# Patient Record
Sex: Female | Born: 1970 | Race: White | Hispanic: No | Marital: Single | Smoking: Current every day smoker
Health system: Southern US, Community
[De-identification: ages and names within clinical notes are randomized; demographics above are authoritative.]

## PROBLEM LIST (undated history)

## (undated) DIAGNOSIS — F419 Anxiety disorder, unspecified: Secondary | ICD-10-CM

## (undated) DIAGNOSIS — I10 Essential (primary) hypertension: Secondary | ICD-10-CM

## (undated) DIAGNOSIS — G43909 Migraine, unspecified, not intractable, without status migrainosus: Secondary | ICD-10-CM

## (undated) DIAGNOSIS — F32A Depression, unspecified: Secondary | ICD-10-CM

## (undated) DIAGNOSIS — Z22322 Carrier or suspected carrier of Methicillin resistant Staphylococcus aureus: Secondary | ICD-10-CM

## (undated) DIAGNOSIS — K219 Gastro-esophageal reflux disease without esophagitis: Secondary | ICD-10-CM

## (undated) DIAGNOSIS — T8859XA Other complications of anesthesia, initial encounter: Secondary | ICD-10-CM

## (undated) DIAGNOSIS — M1990 Unspecified osteoarthritis, unspecified site: Secondary | ICD-10-CM

## (undated) HISTORY — PX: TONSILLECTOMY: SUR1361

## (undated) HISTORY — PX: CHOLECYSTECTOMY: SHX55

## (undated) HISTORY — DX: Gastro-esophageal reflux disease without esophagitis: K21.9

## (undated) HISTORY — DX: Essential (primary) hypertension: I10

## (undated) HISTORY — DX: Migraine, unspecified, not intractable, without status migrainosus: G43.909

## (undated) HISTORY — PX: TUBAL LIGATION: SHX77

## (undated) HISTORY — DX: Unspecified osteoarthritis, unspecified site: M19.90

---

## 2004-04-21 ENCOUNTER — Ambulatory Visit: Payer: Self-pay | Admitting: Unknown Physician Specialty

## 2005-04-05 ENCOUNTER — Emergency Department (HOSPITAL_COMMUNITY): Admission: EM | Admit: 2005-04-05 | Discharge: 2005-04-05 | Payer: Self-pay | Admitting: Emergency Medicine

## 2005-04-05 IMAGING — CR DG HIP (WITH OR WITHOUT PELVIS) 2-3V*L*
3 series · 3 of 3 positions shown · non-contrast
Comparison: none

CLINICAL DATA: Fall with left arm, low back, left hip, and left ankle pain.
 LEFT HUMERUS - 2 VIEW:

[t pelvis a.p.]
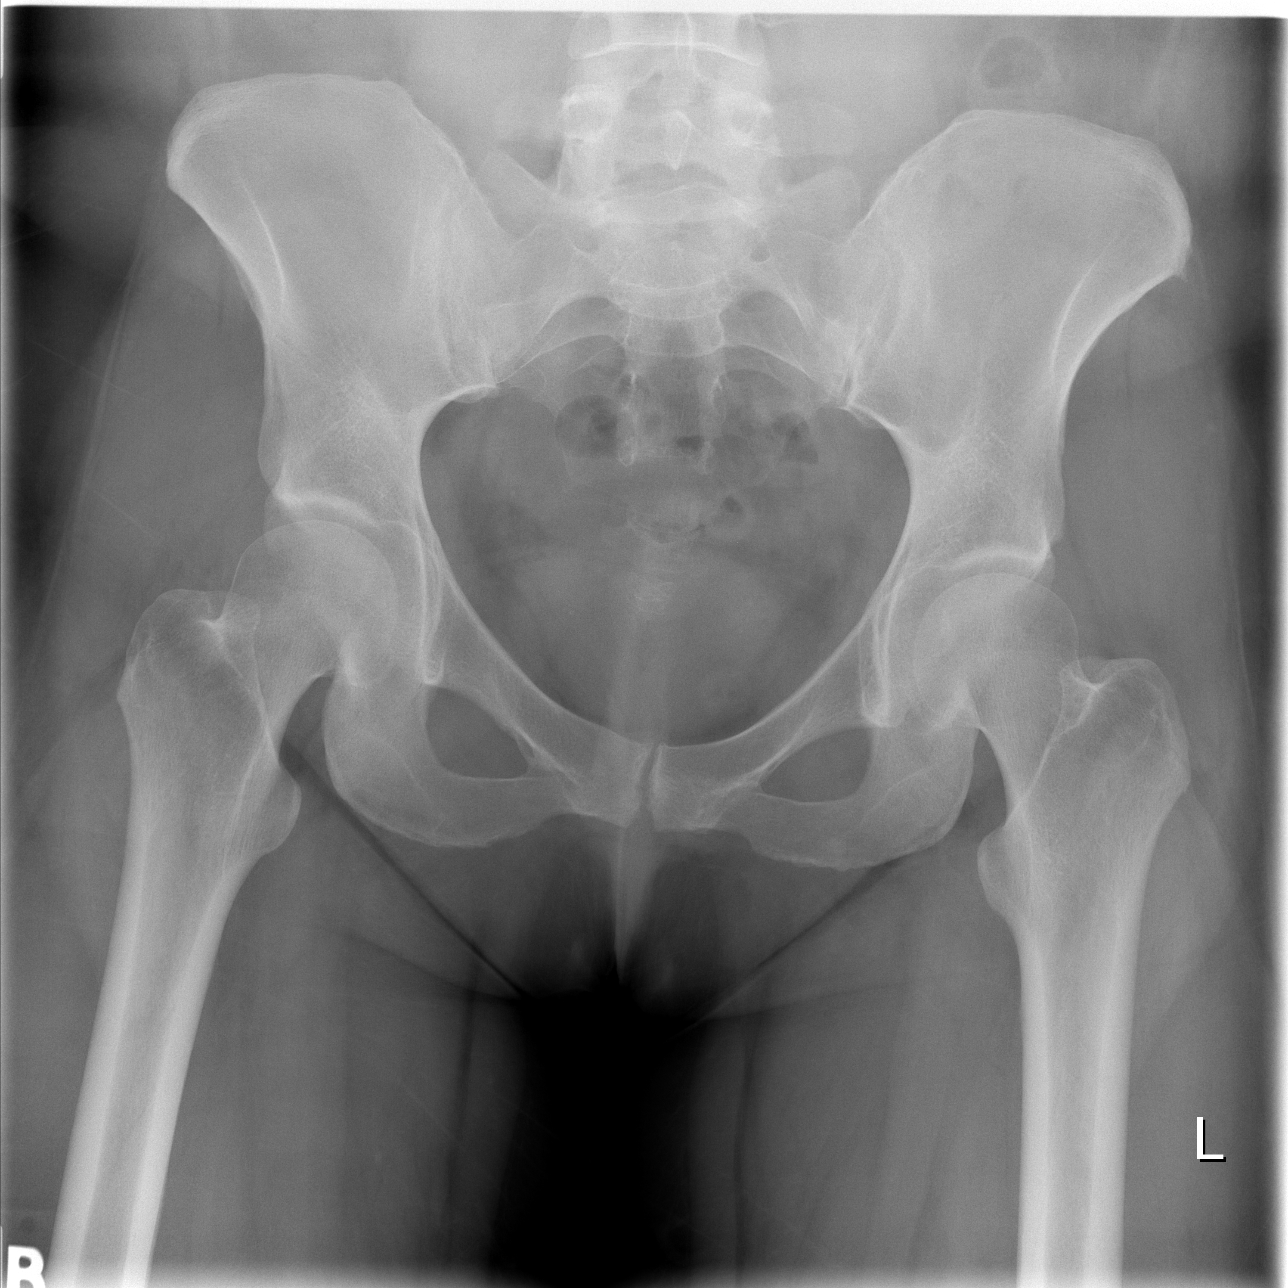

[t hip ap left]
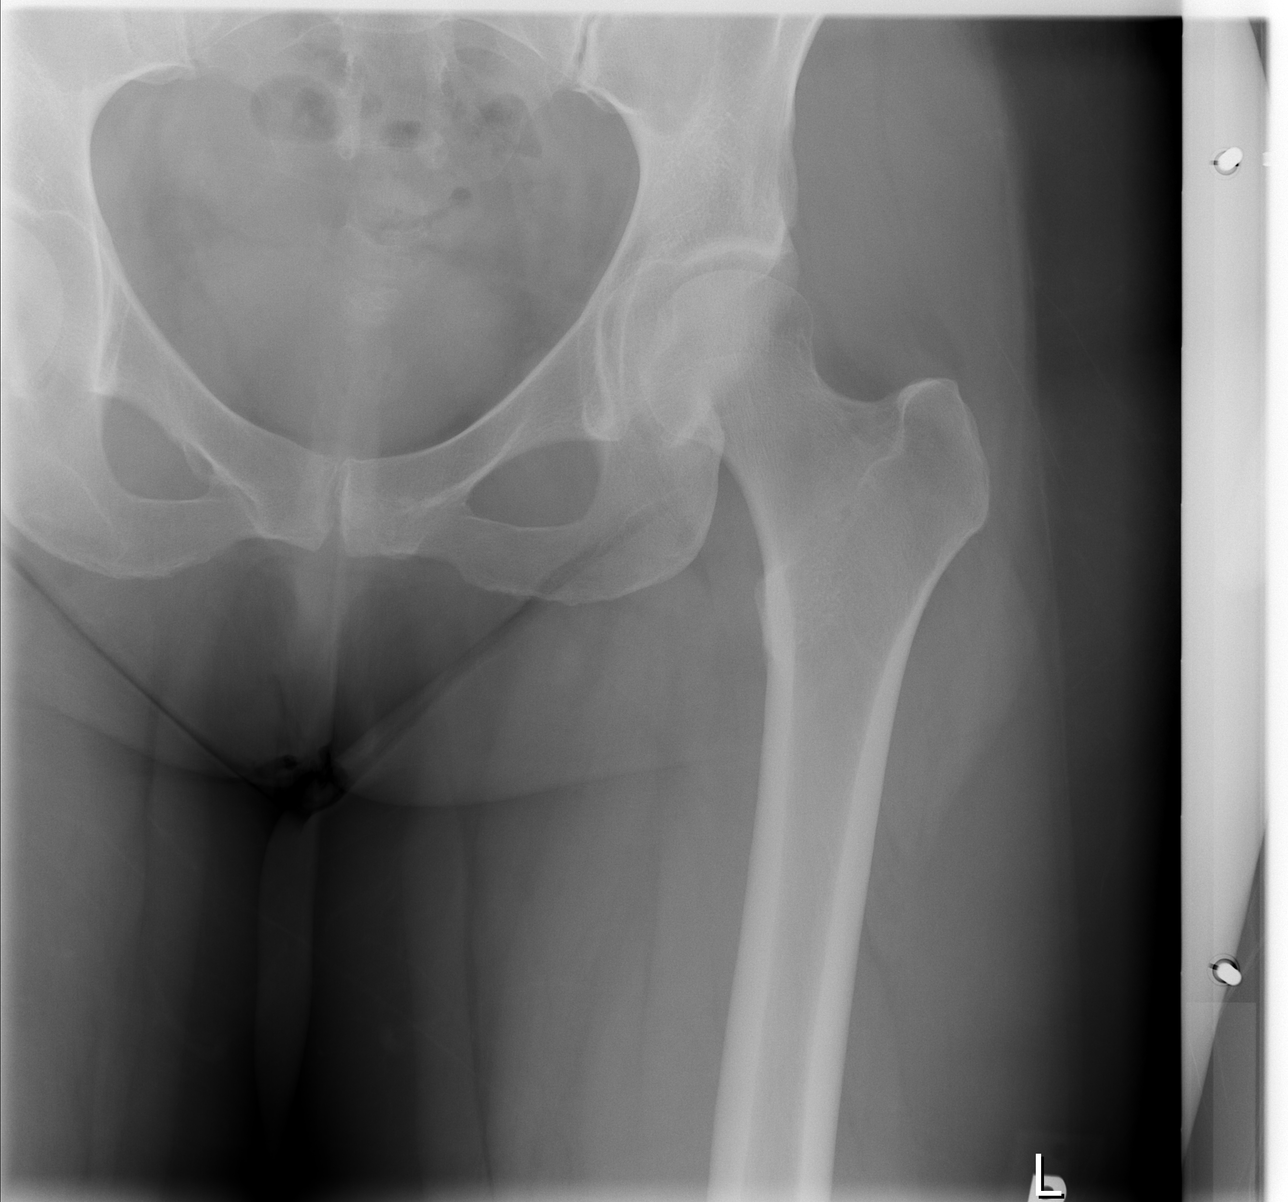

[t hip frog leg left]
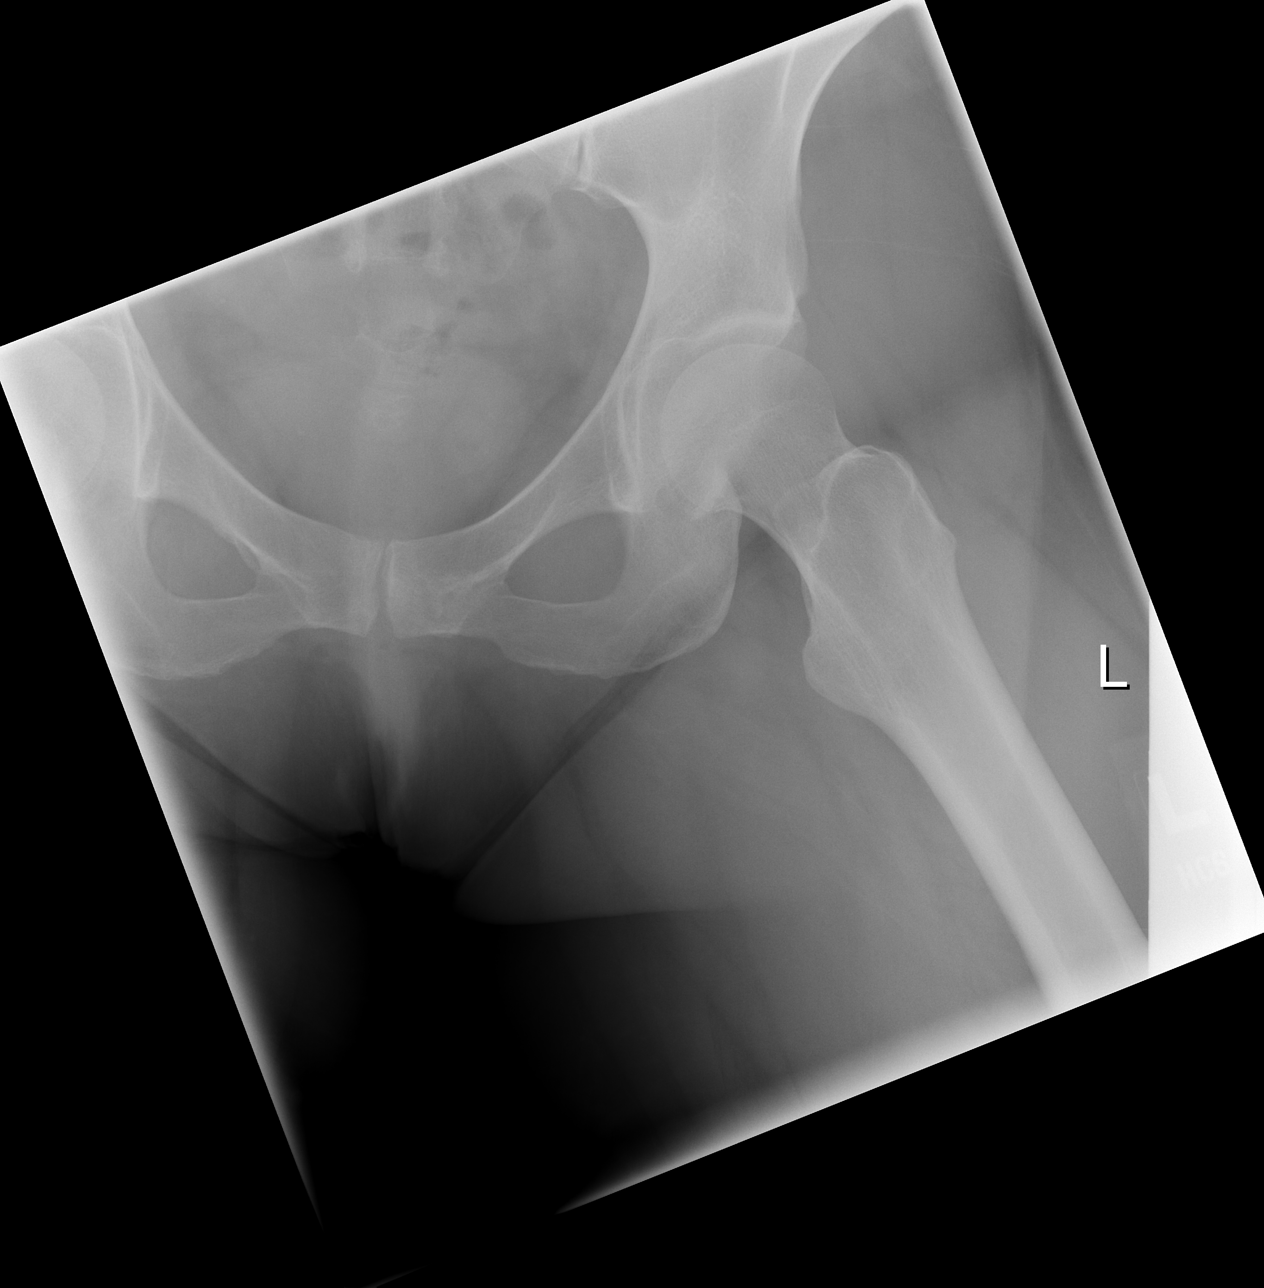

[3 of 3 positions shown; findings below may reference images not displayed]

FINDINGS: There is no evidence of fracture or other focal bone lesions.  Soft tissues are unremarkable.
IMPRESSION: Negative.
 LEFT ANKLE - 3 VIEW:
FINDINGS: There is no evidence of fracture, dislocation, or joint effusion.  There is no evidence of arthropathy or other focal bone abnormality.  Soft tissues are unremarkable.
IMPRESSION: Negative.
 LEFT HIP - 3 VIEW:
FINDINGS: There is no evidence of hip fracture or dislocation.  There is no evidence of arthropathy or other focal bone abnormality.
IMPRESSION: Negative.
 LUMBAR SPINE - 5 VIEW:
FINDINGS: There is no evidence of lumbar spine fracture.  Alignment is normal.  Intervertebral disc spaces are maintained, and no other significant bone abnormalities are identified.
IMPRESSION: Negative lumbar spine radiographs.

## 2005-04-05 IMAGING — CR DG ANKLE COMPLETE 3+V*L*
3 series · 3 of 3 positions shown · non-contrast
Comparison: none

CLINICAL DATA: Fall with left arm, low back, left hip, and left ankle pain.
 LEFT HUMERUS - 2 VIEW:

[t ankle joint ap left]
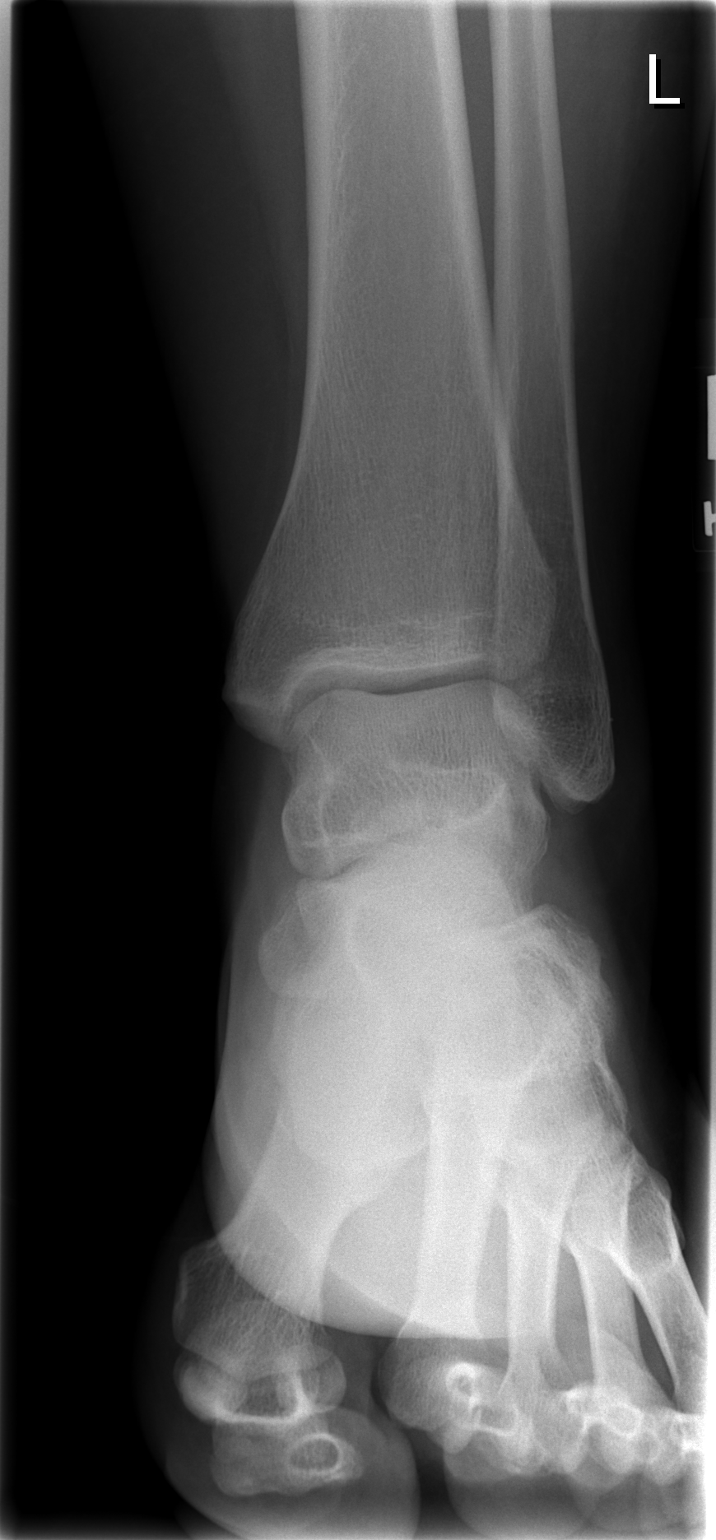

[t ankle joint oblique left]
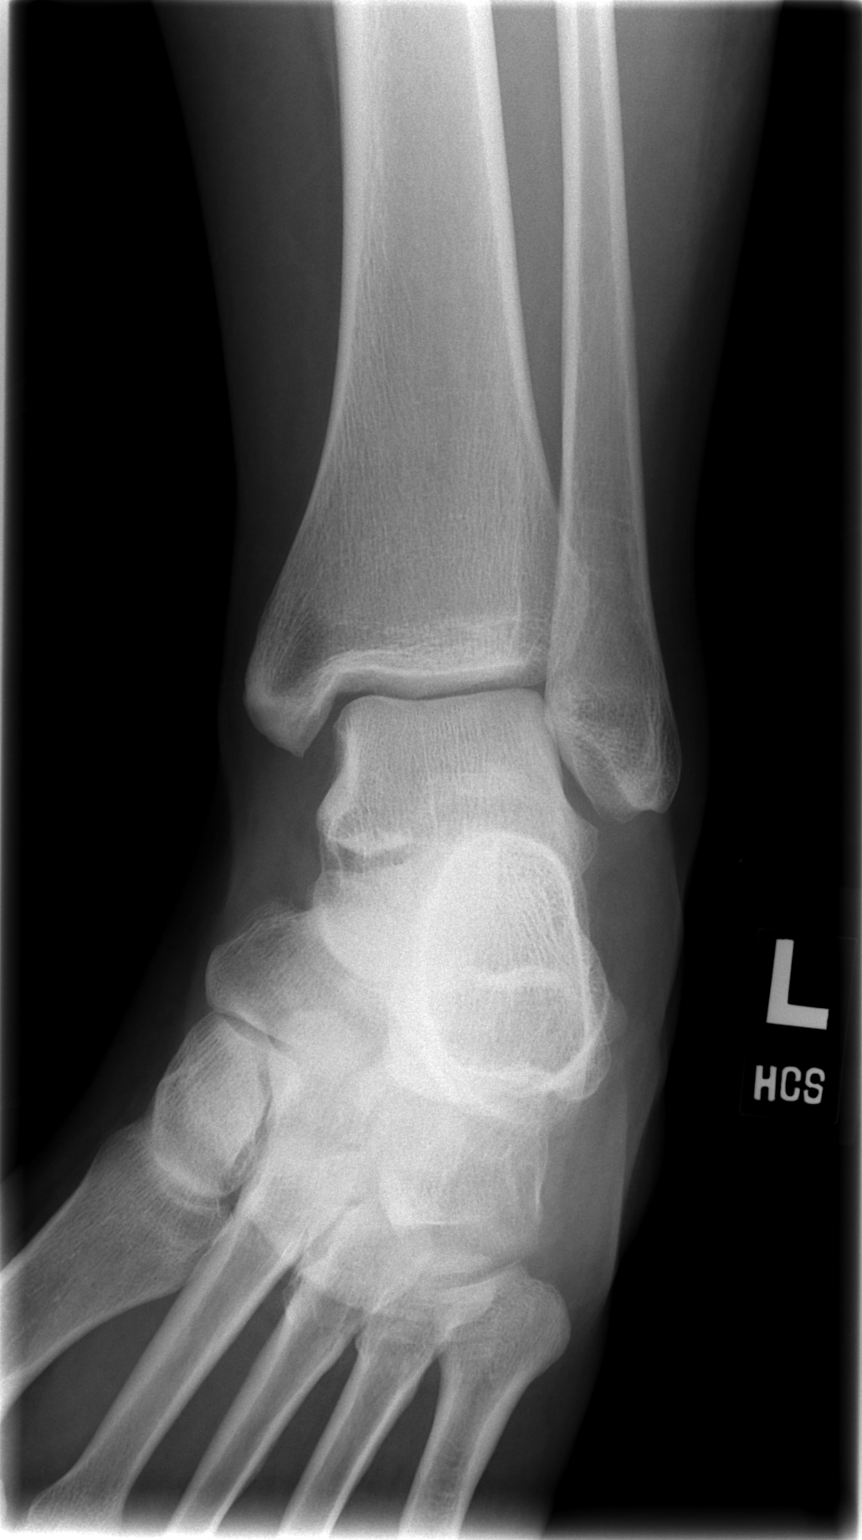

[t ankle joint lat left]
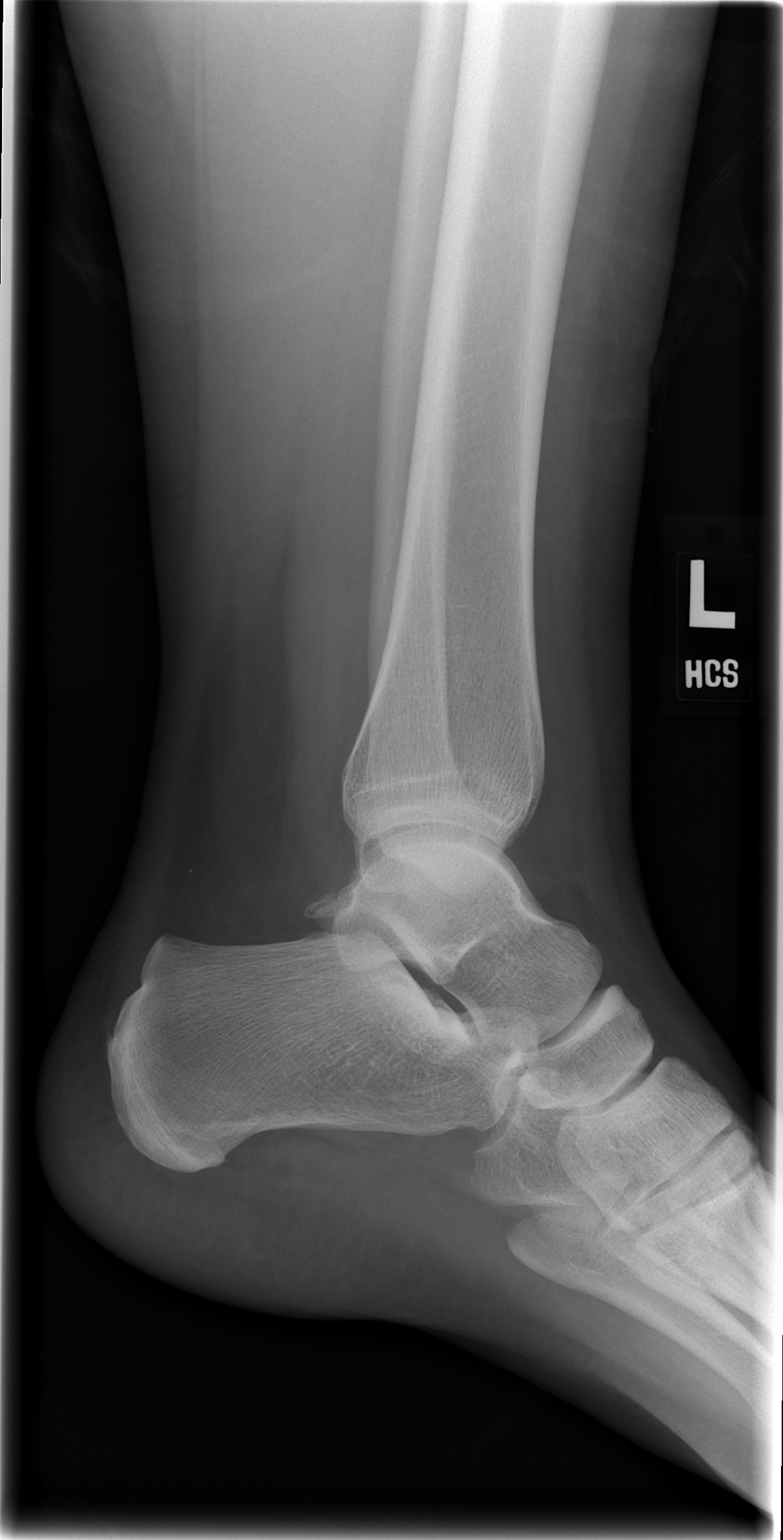

[3 of 3 positions shown; findings below may reference images not displayed]

FINDINGS: There is no evidence of fracture or other focal bone lesions.  Soft tissues are unremarkable.
IMPRESSION: Negative.
 LEFT ANKLE - 3 VIEW:
FINDINGS: There is no evidence of fracture, dislocation, or joint effusion.  There is no evidence of arthropathy or other focal bone abnormality.  Soft tissues are unremarkable.
IMPRESSION: Negative.
 LEFT HIP - 3 VIEW:
FINDINGS: There is no evidence of hip fracture or dislocation.  There is no evidence of arthropathy or other focal bone abnormality.
IMPRESSION: Negative.
 LUMBAR SPINE - 5 VIEW:
FINDINGS: There is no evidence of lumbar spine fracture.  Alignment is normal.  Intervertebral disc spaces are maintained, and no other significant bone abnormalities are identified.
IMPRESSION: Negative lumbar spine radiographs.

## 2005-04-05 IMAGING — CR DG HUMERUS 2V *L*
2 series · 2 of 2 positions shown · non-contrast
Comparison: none

CLINICAL DATA: Fall with left arm, low back, left hip, and left ankle pain.
 LEFT HUMERUS - 2 VIEW:

[view not recorded (1 of 2)]
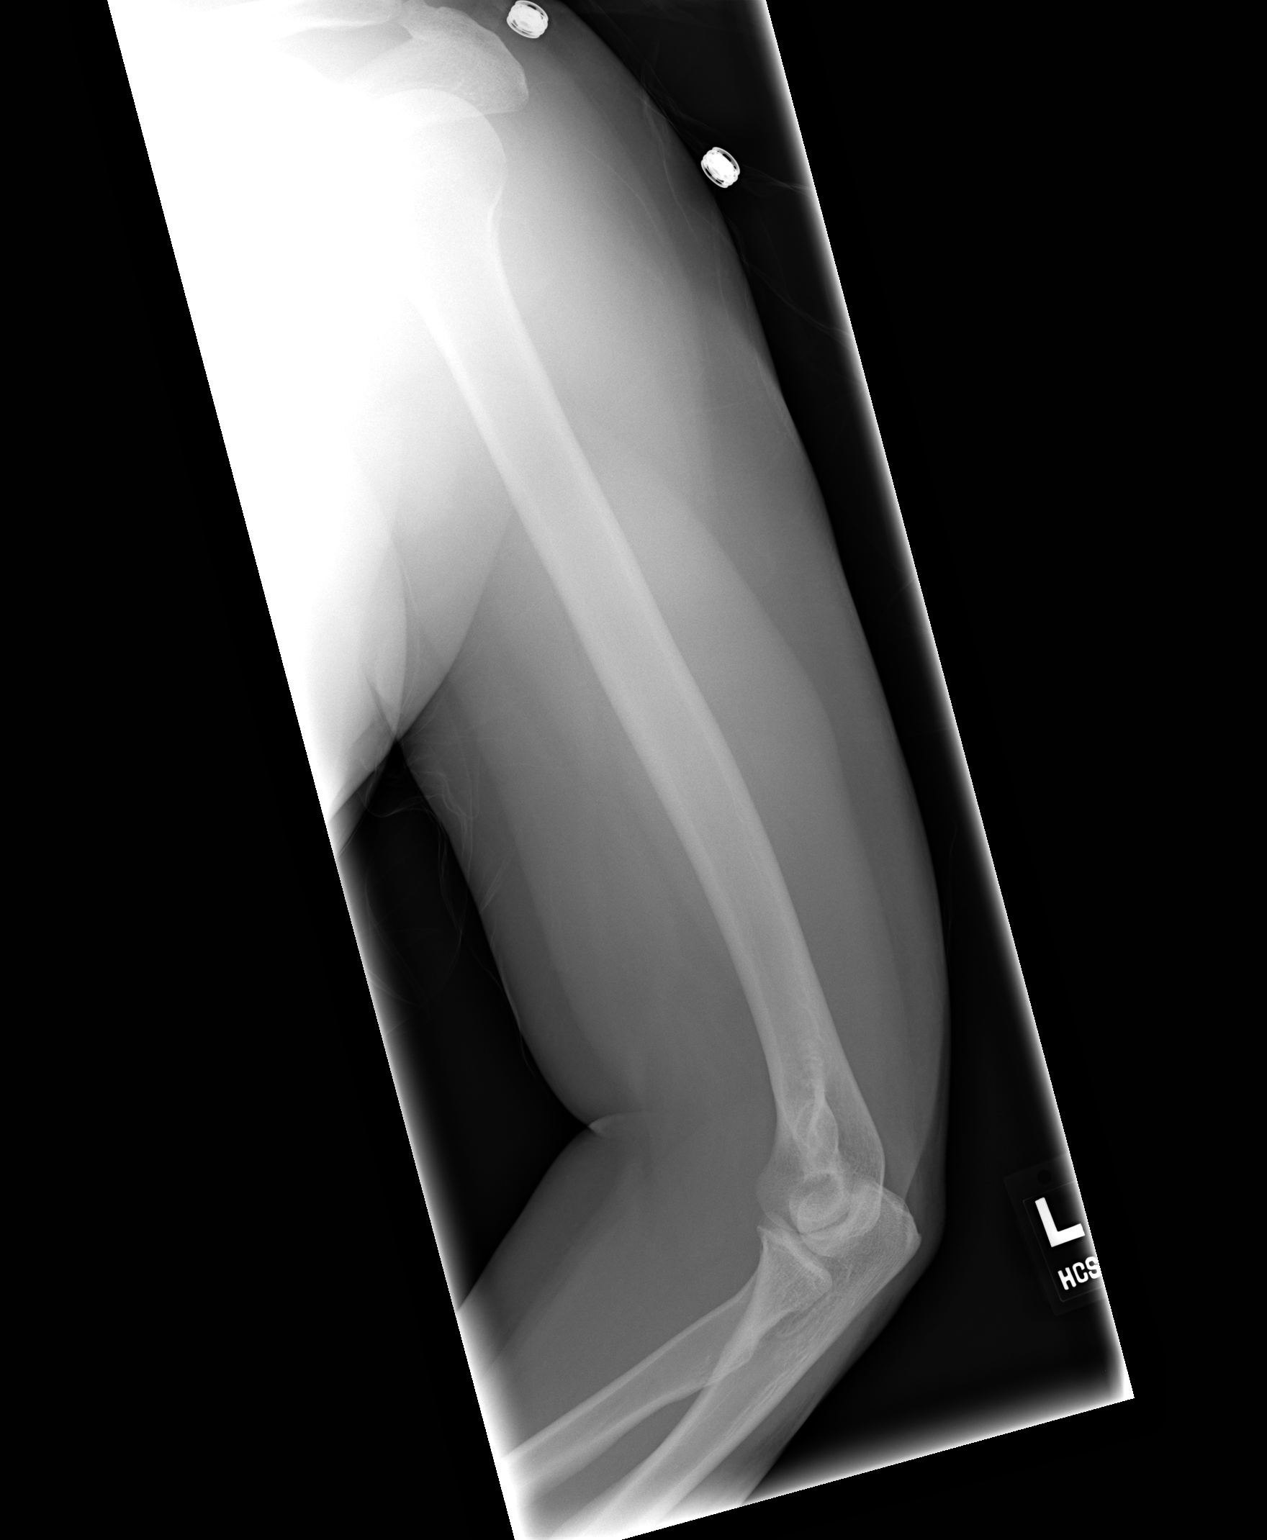

[view not recorded (2 of 2)]
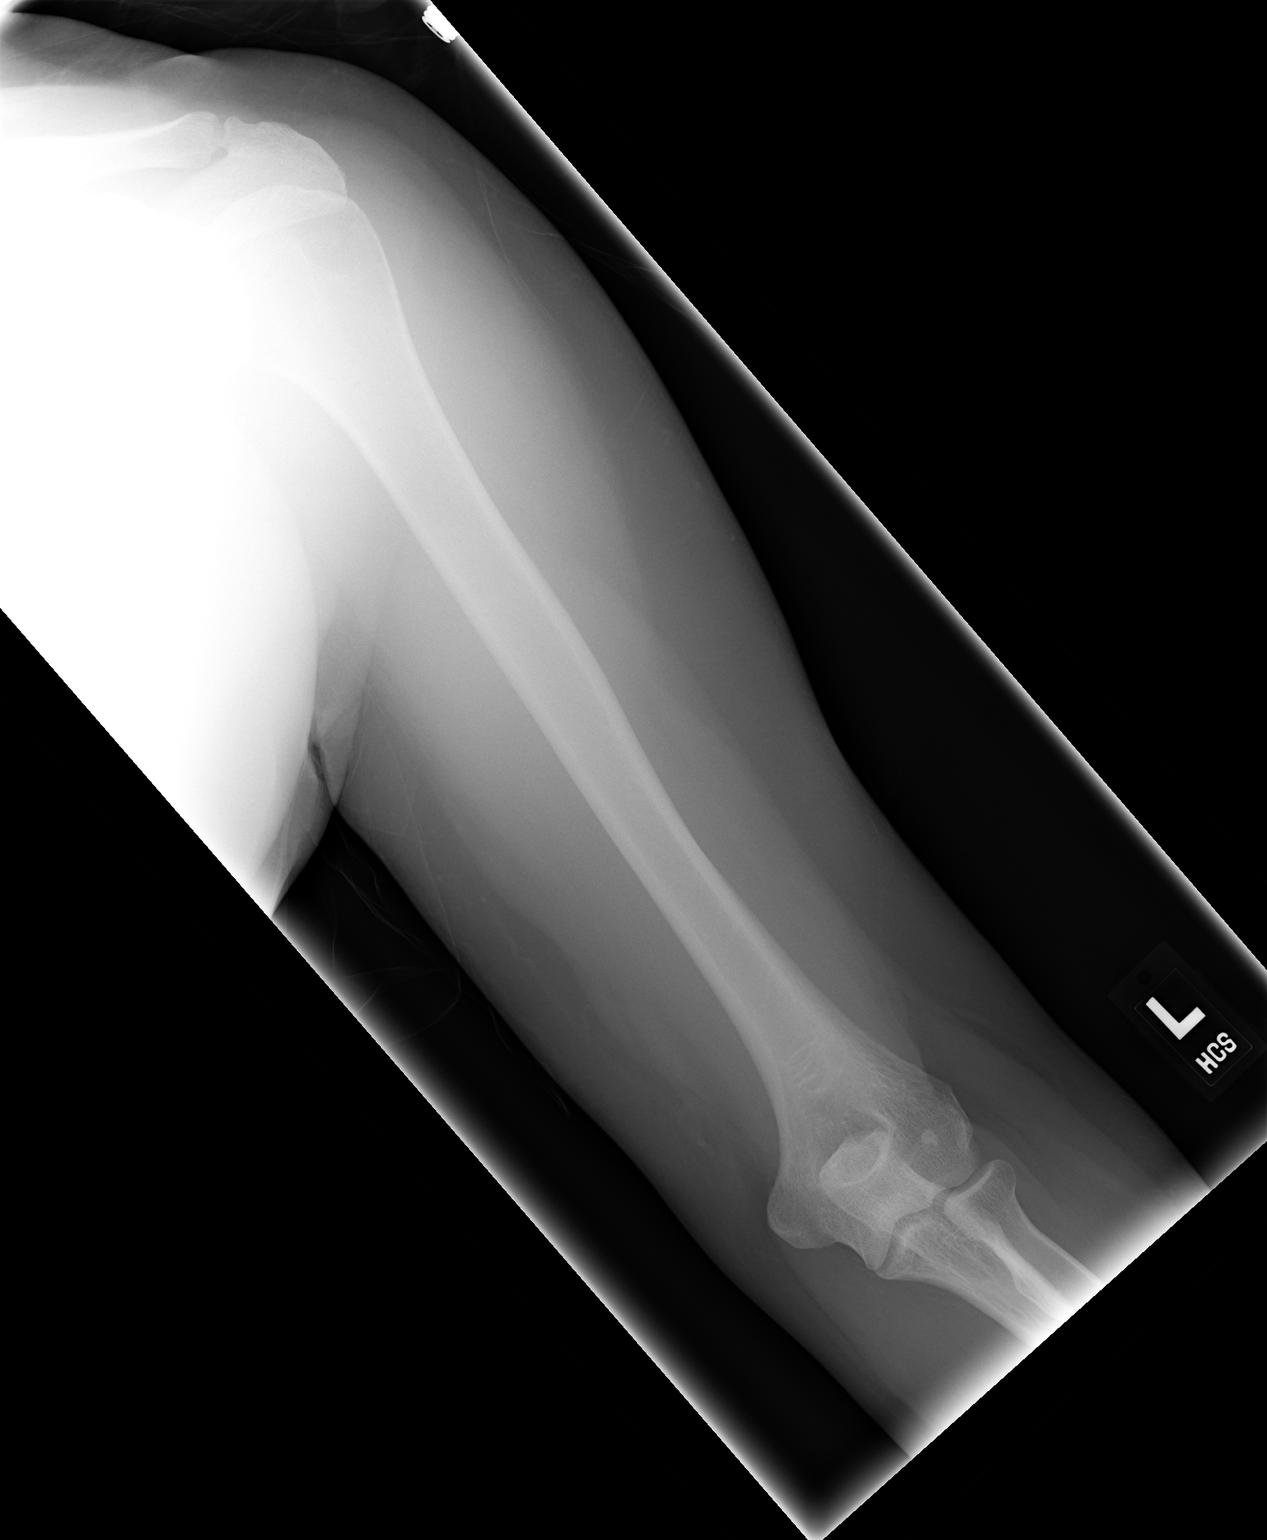

[2 of 2 positions shown; findings below may reference images not displayed]

FINDINGS: There is no evidence of fracture or other focal bone lesions.  Soft tissues are unremarkable.
IMPRESSION: Negative.
 LEFT ANKLE - 3 VIEW:
FINDINGS: There is no evidence of fracture, dislocation, or joint effusion.  There is no evidence of arthropathy or other focal bone abnormality.  Soft tissues are unremarkable.
IMPRESSION: Negative.
 LEFT HIP - 3 VIEW:
FINDINGS: There is no evidence of hip fracture or dislocation.  There is no evidence of arthropathy or other focal bone abnormality.
IMPRESSION: Negative.
 LUMBAR SPINE - 5 VIEW:
FINDINGS: There is no evidence of lumbar spine fracture.  Alignment is normal.  Intervertebral disc spaces are maintained, and no other significant bone abnormalities are identified.
IMPRESSION: Negative lumbar spine radiographs.

## 2005-04-05 IMAGING — CR DG LUMBAR SPINE COMPLETE 4+V
5 series · 5 of 5 positions shown · non-contrast
Comparison: none

CLINICAL DATA: Fall with left arm, low back, left hip, and left ankle pain.
 LEFT HUMERUS - 2 VIEW:

[t l-spine a.p.]
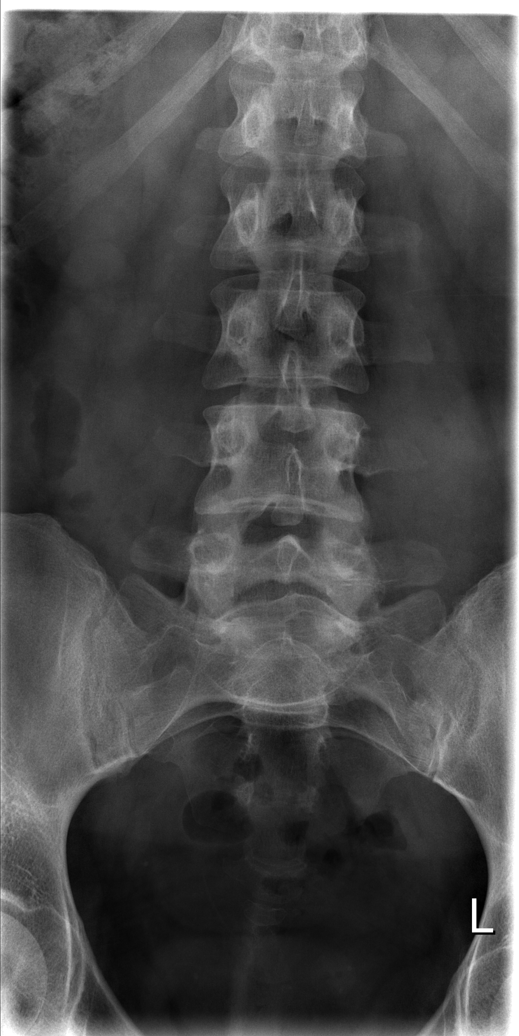

[t l-spine oblique exposure (1 of 2)]
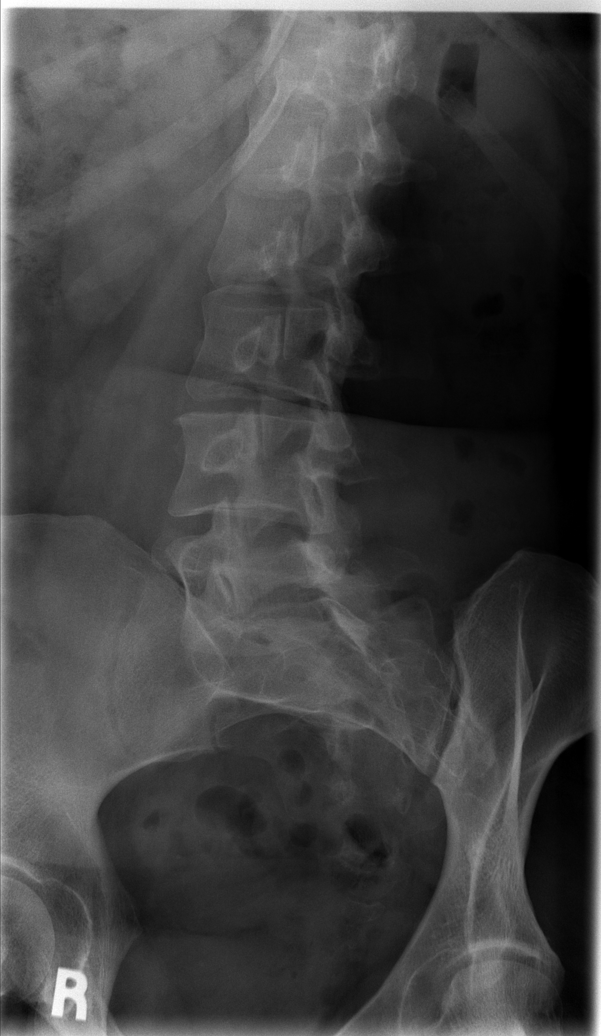

[t l-spine oblique exposure (2 of 2)]
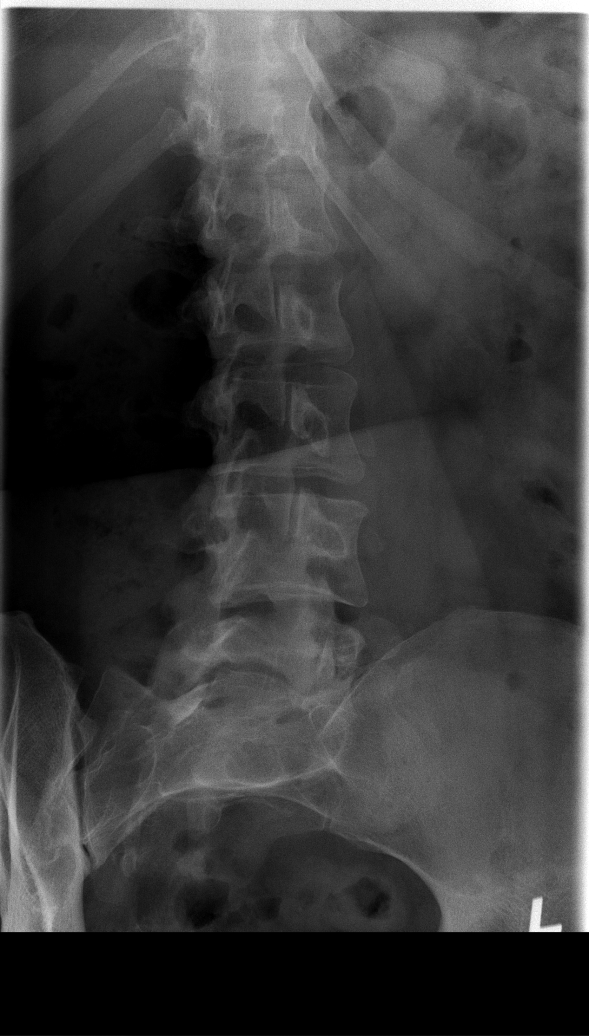

[t l-spine lat]
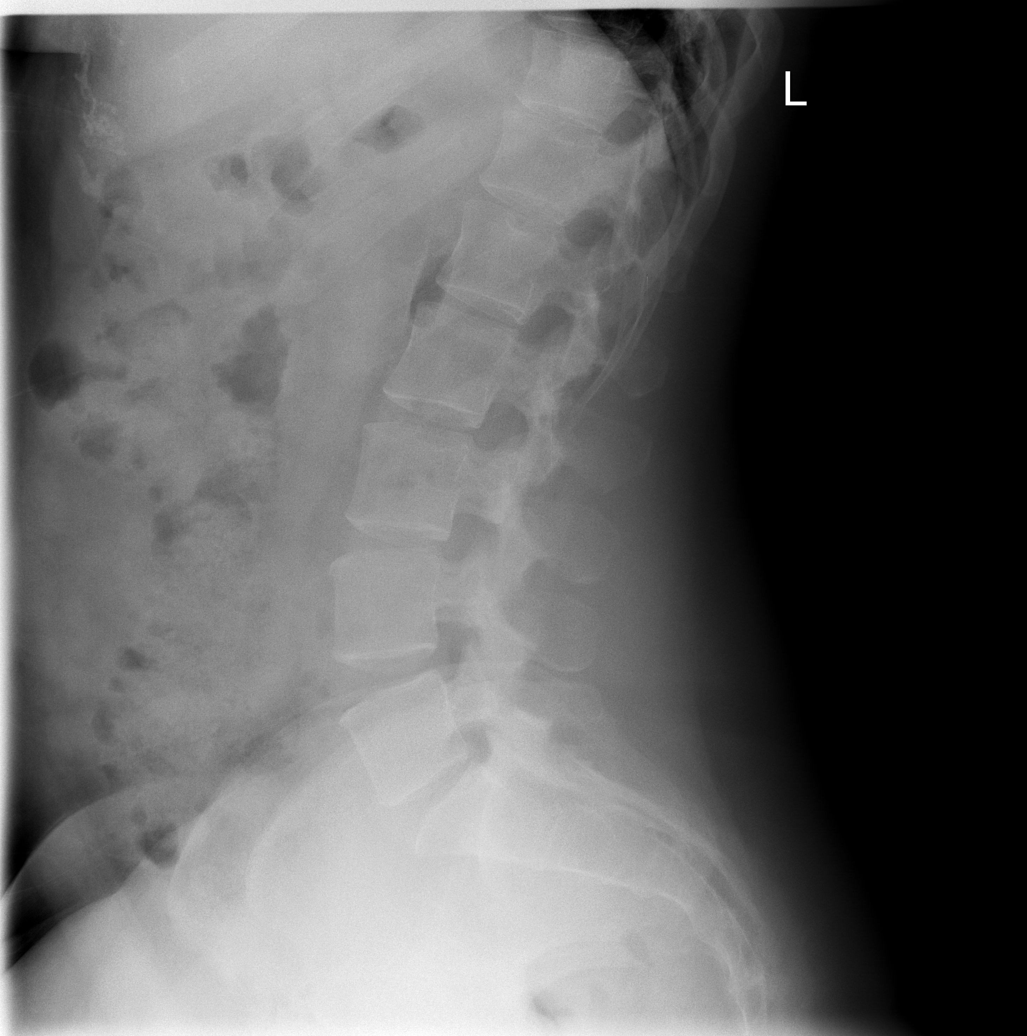

[t l-spine l5-s1 spot]
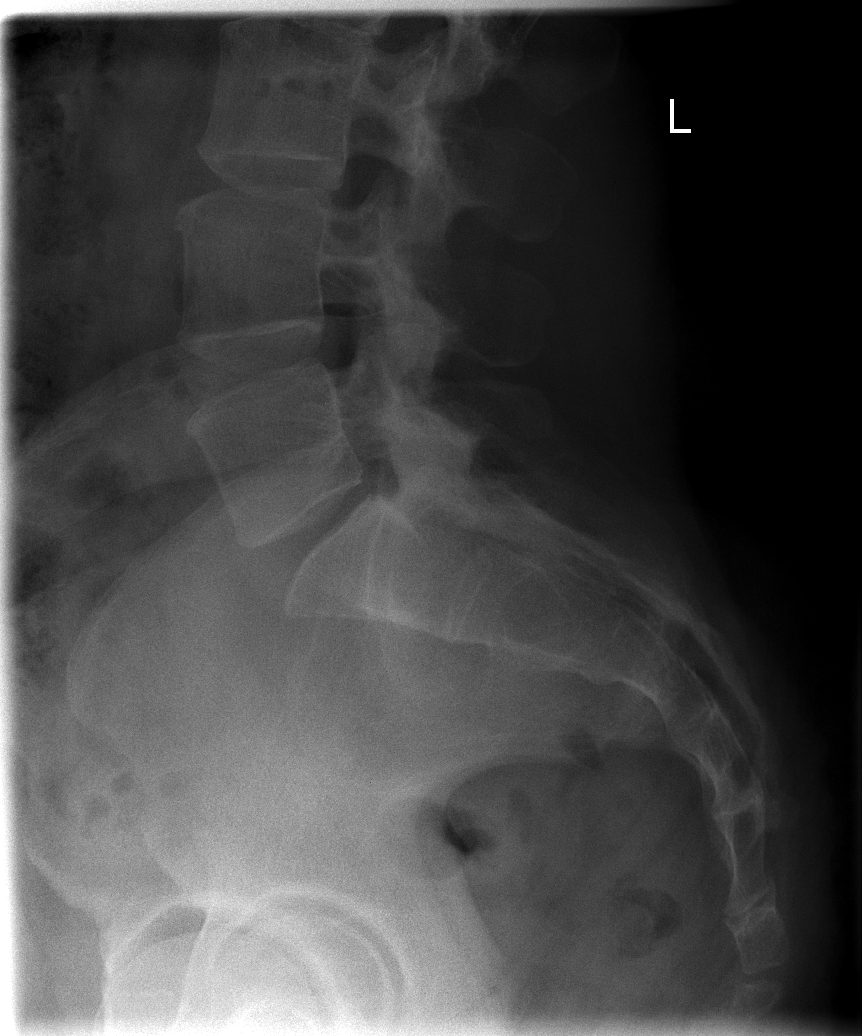

[5 of 5 positions shown; findings below may reference images not displayed]

FINDINGS: There is no evidence of fracture or other focal bone lesions.  Soft tissues are unremarkable.
IMPRESSION: Negative.
 LEFT ANKLE - 3 VIEW:
FINDINGS: There is no evidence of fracture, dislocation, or joint effusion.  There is no evidence of arthropathy or other focal bone abnormality.  Soft tissues are unremarkable.
IMPRESSION: Negative.
 LEFT HIP - 3 VIEW:
FINDINGS: There is no evidence of hip fracture or dislocation.  There is no evidence of arthropathy or other focal bone abnormality.
IMPRESSION: Negative.
 LUMBAR SPINE - 5 VIEW:
FINDINGS: There is no evidence of lumbar spine fracture.  Alignment is normal.  Intervertebral disc spaces are maintained, and no other significant bone abnormalities are identified.
IMPRESSION: Negative lumbar spine radiographs.

## 2005-07-07 ENCOUNTER — Emergency Department (HOSPITAL_COMMUNITY): Admission: EM | Admit: 2005-07-07 | Discharge: 2005-07-07 | Payer: Self-pay | Admitting: Emergency Medicine

## 2006-09-18 ENCOUNTER — Emergency Department (HOSPITAL_COMMUNITY): Admission: EM | Admit: 2006-09-18 | Discharge: 2006-09-18 | Payer: Self-pay | Admitting: *Deleted

## 2007-03-17 ENCOUNTER — Emergency Department: Payer: Self-pay | Admitting: Emergency Medicine

## 2007-03-17 IMAGING — CR RIGHT ANKLE - COMPLETE 3+ VIEW
1 series · 5 of 5 positions shown · non-contrast
Comparison: none

REASON FOR EXAM: ankle swelling and pain - [place]
COMMENTS:

PROCEDURE:     DXR - DXR ANKLE RIGHT COMPLETE  - [DATE]  [DATE]
RESULT:     Degenerative change is appreciated involving the RIGHT ankle.
There does not appear to be evidence of acute fracture, dislocation or
malalignment.

[Series 1: view not recorded · 0.17mm/px · 5 of 5 slices shown]
[im 1/5]
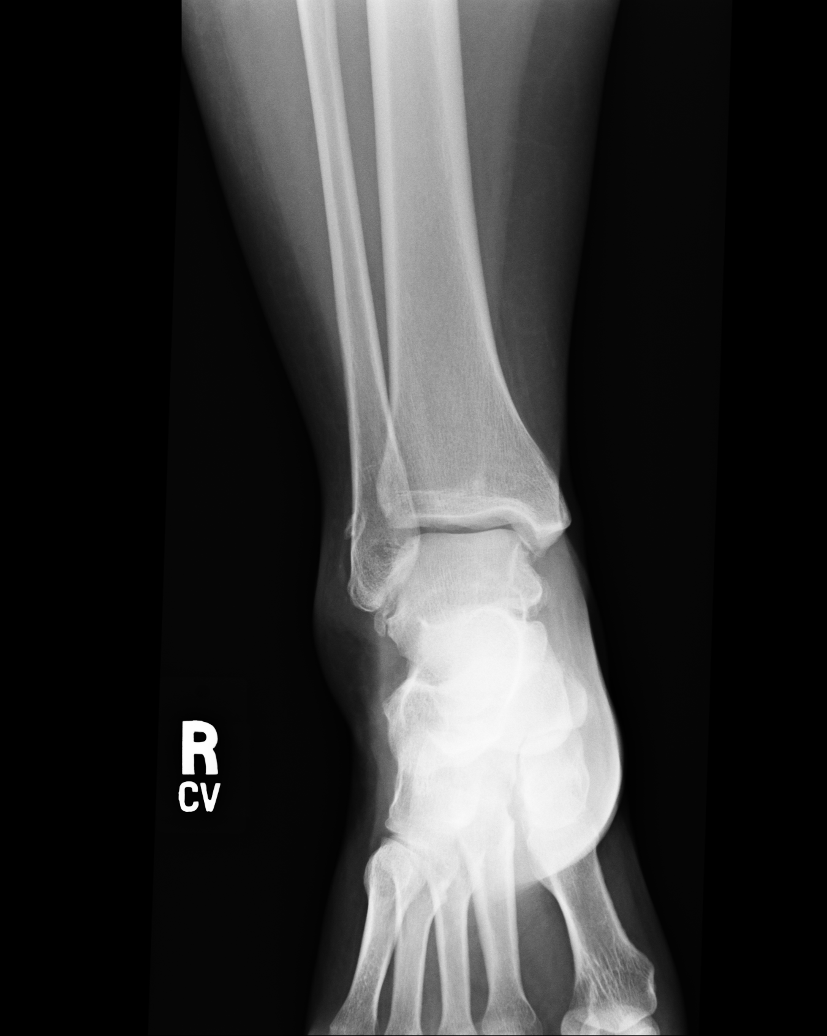
[im 2/5]
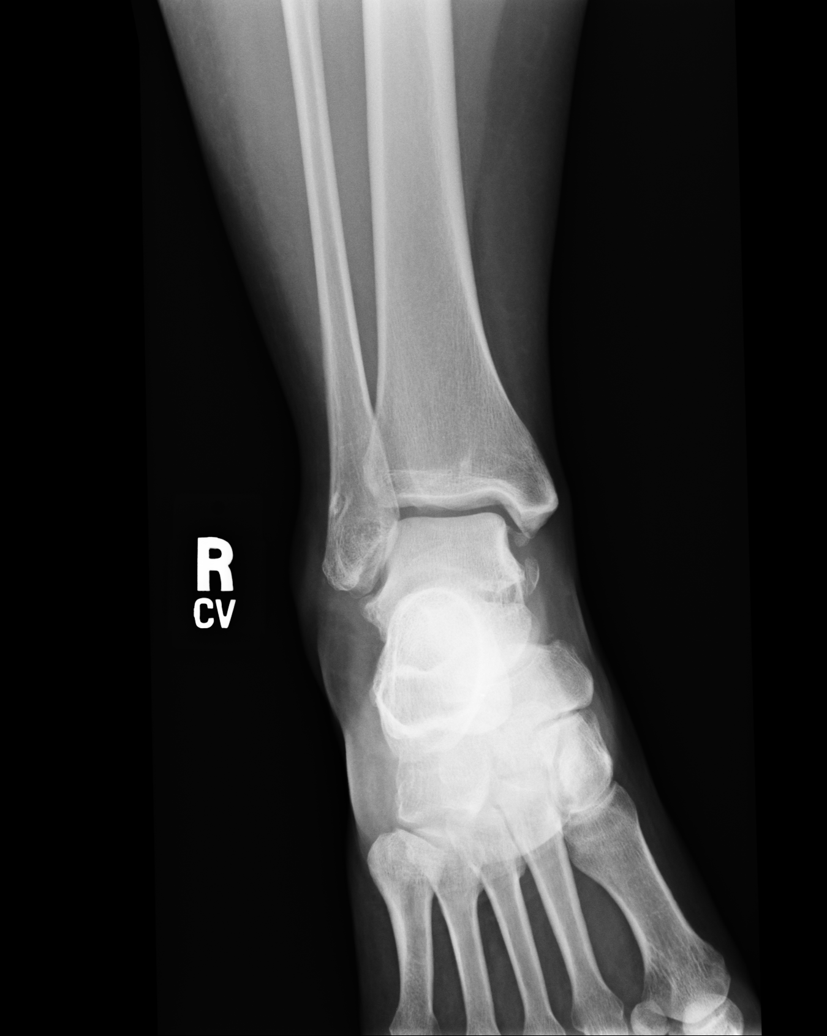
[im 3/5]
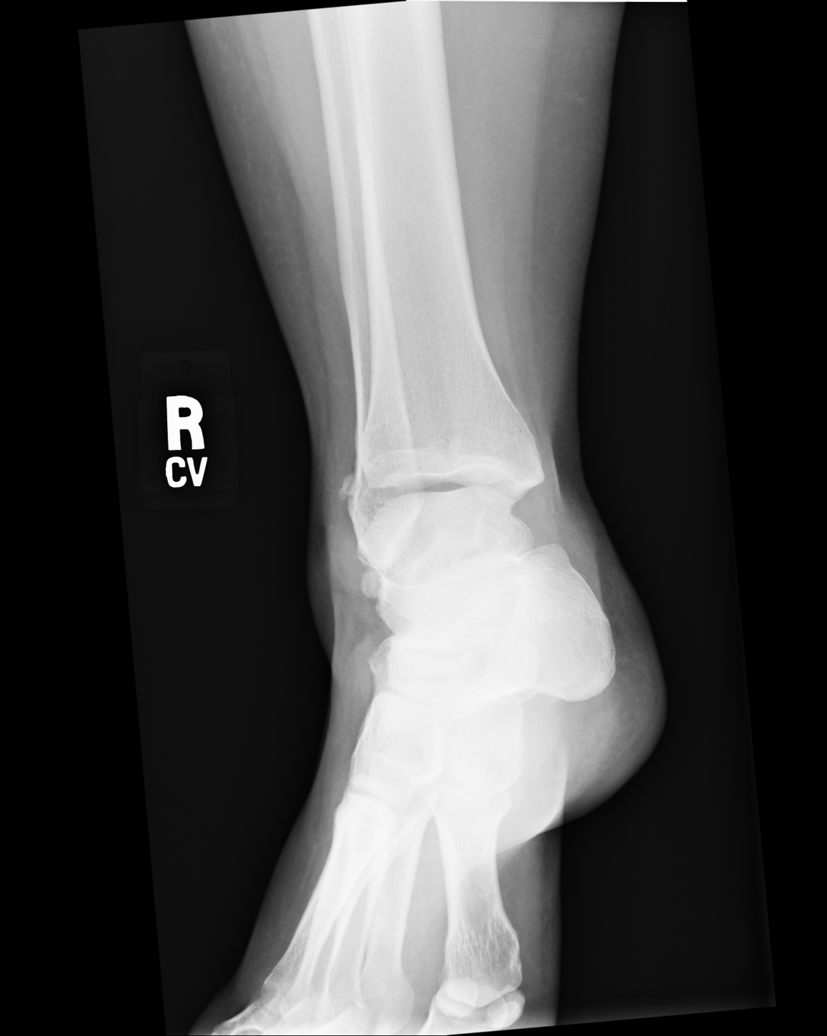
[im 4/5]
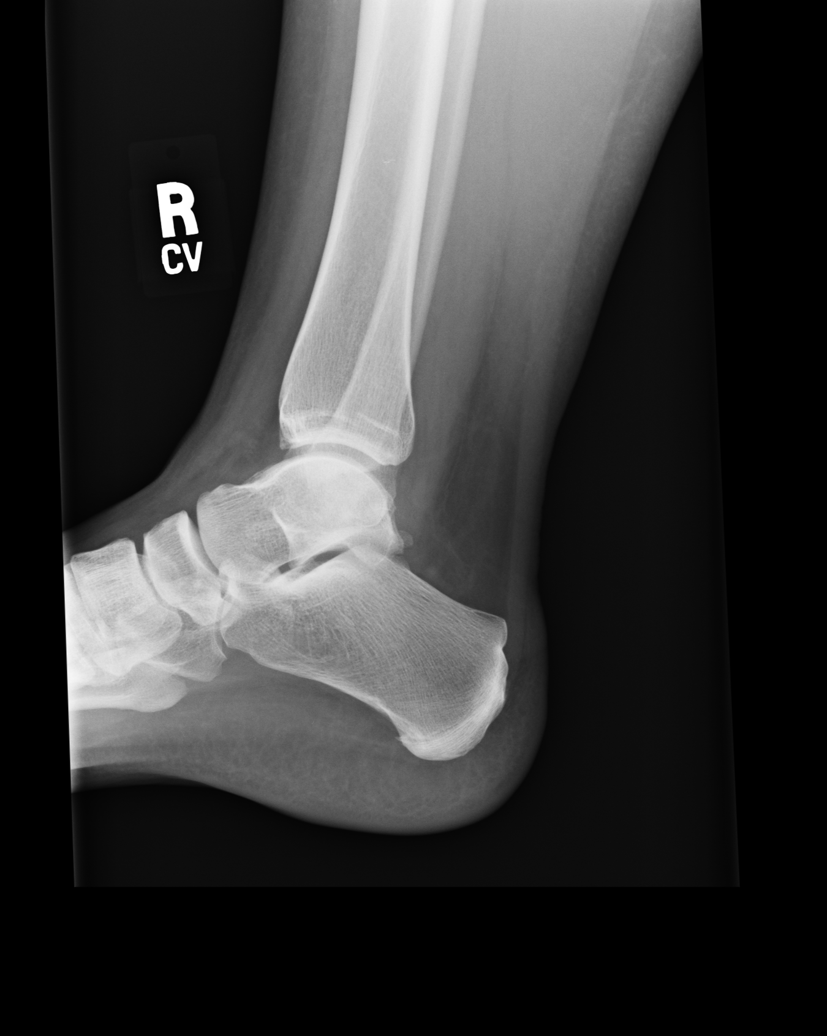
[im 5/5]
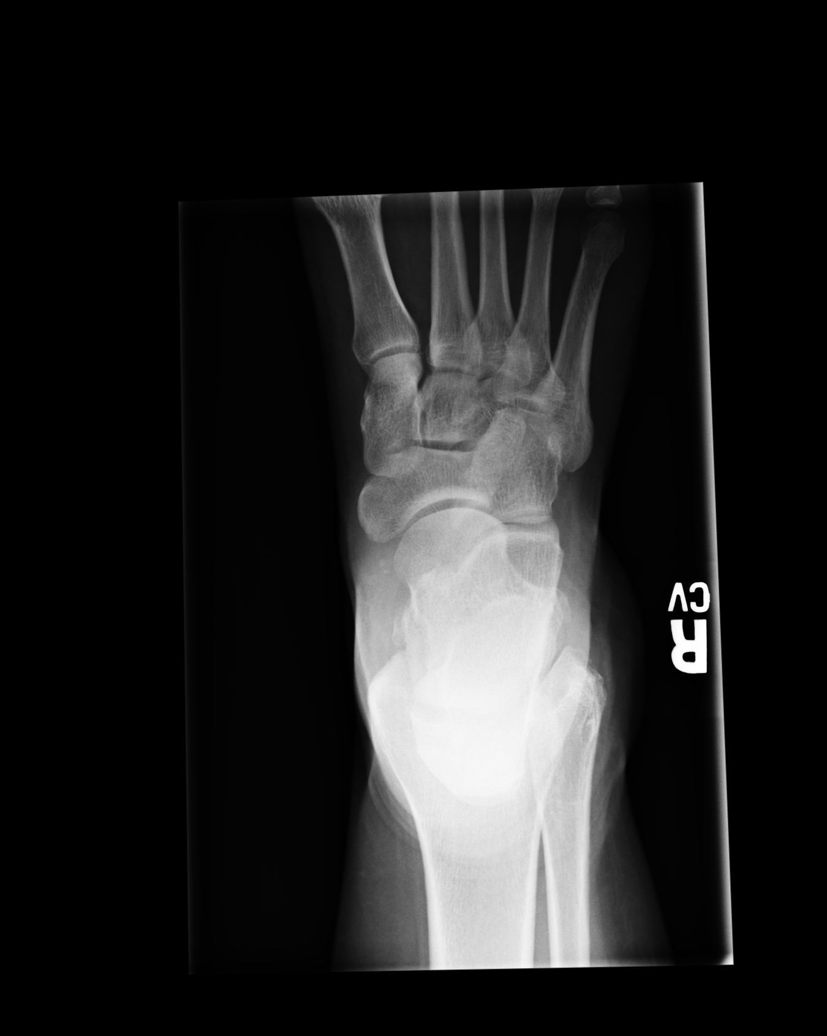

[5 of 5 positions shown; findings below may reference images not displayed]

IMPRESSION:

## 2007-04-14 ENCOUNTER — Emergency Department (HOSPITAL_COMMUNITY): Admission: EM | Admit: 2007-04-14 | Discharge: 2007-04-14 | Payer: Self-pay | Admitting: Emergency Medicine

## 2007-04-14 IMAGING — US US PELVIS COMPLETE MODIFY
1 series · 13 of 25 positions shown · non-contrast
Comparison: none

CLINICAL DATA: Pelvic pain/spotting.
 TRANSABDOMINAL AND TRANSVAGINAL PELVIC ULTRASOUND:
TECHNIQUE: Both transabdominal and transvaginal ultrasound examinations of the pelvis were performed including evaluation of the uterus, ovaries, adnexal regions, and pelvic cul-de-sac.

[Series 1: unknown · 0.30mm/px · 13 of 35 slices shown]
[im 1/35]
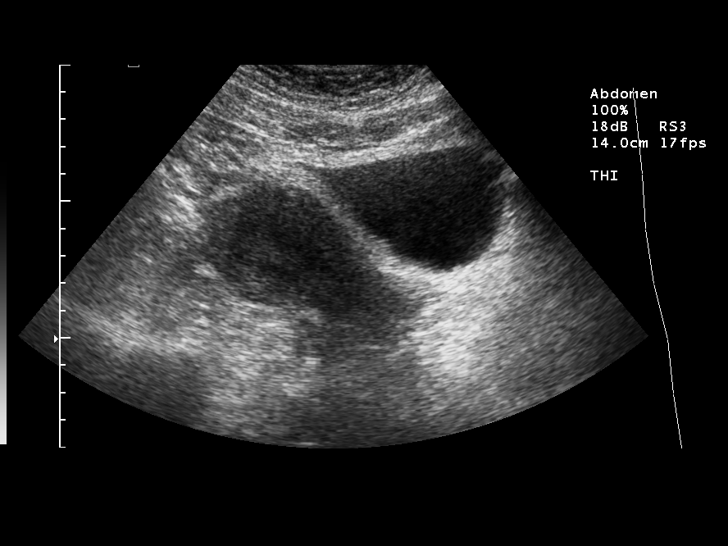
[im 3/35]
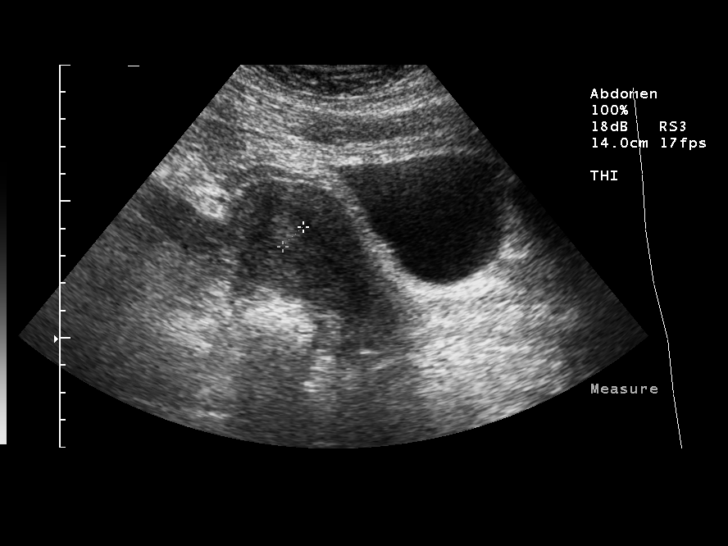
[im 6/35]
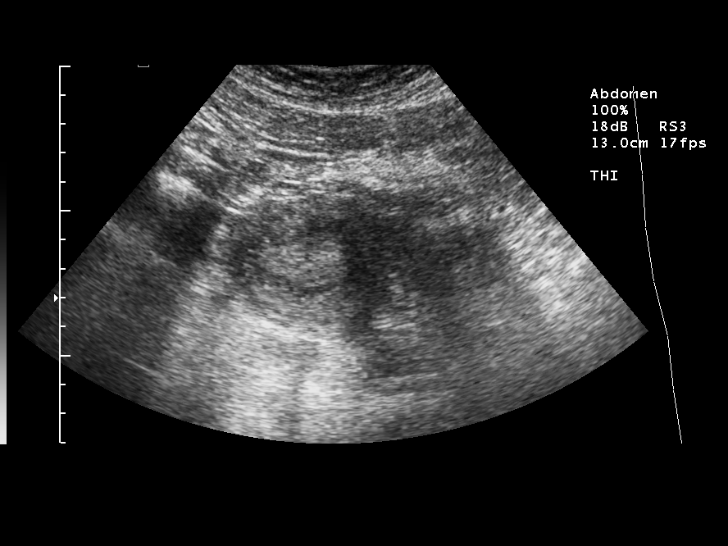
[im 9/35]
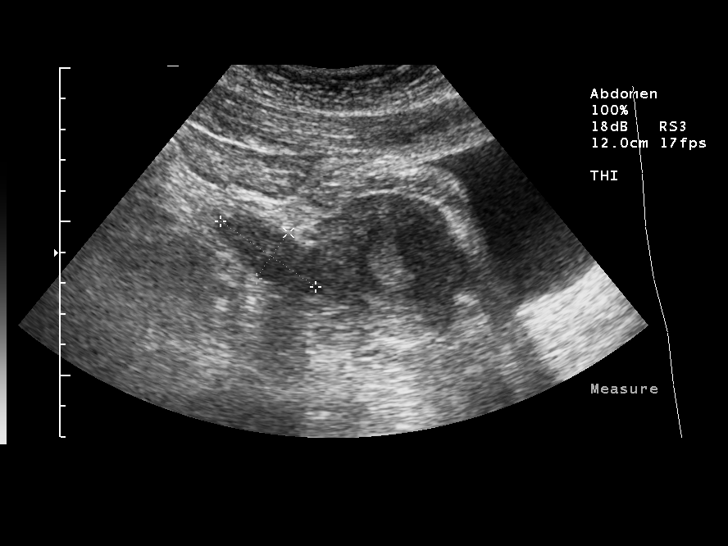
[im 12/35]
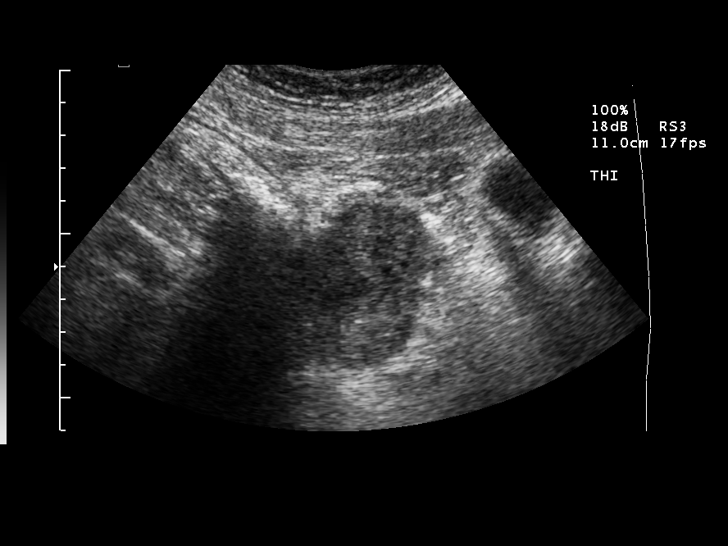
[im 15/35]
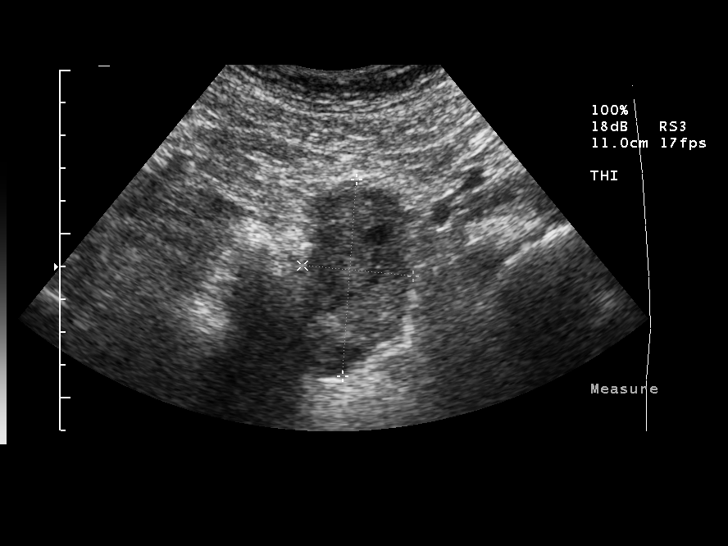
[im 18/35]
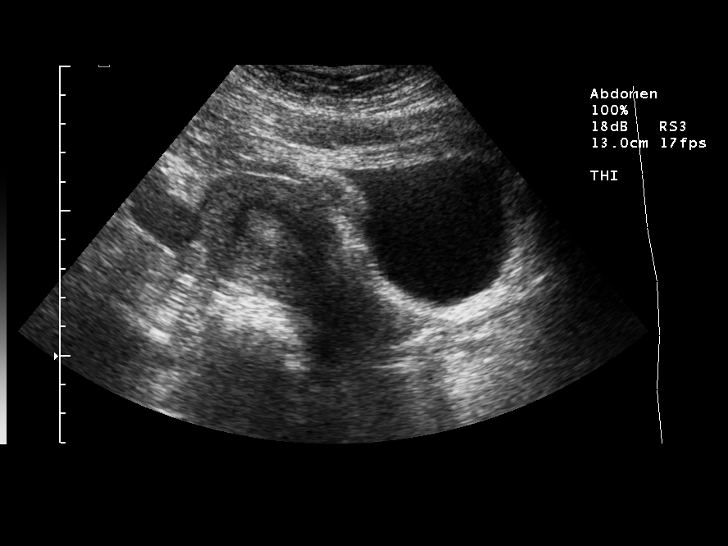
[im 20/35]
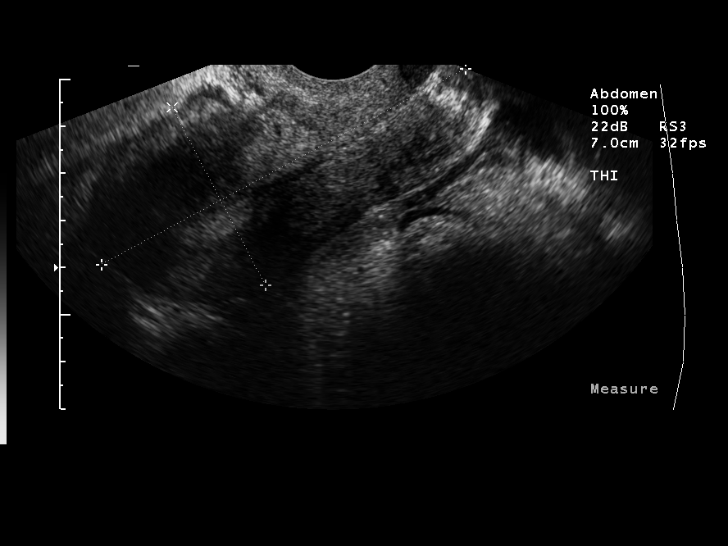
[im 23/35]
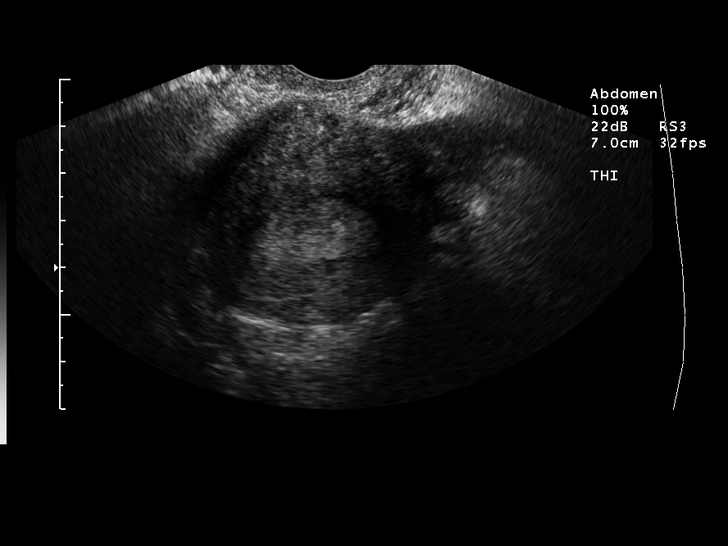
[im 26/35]
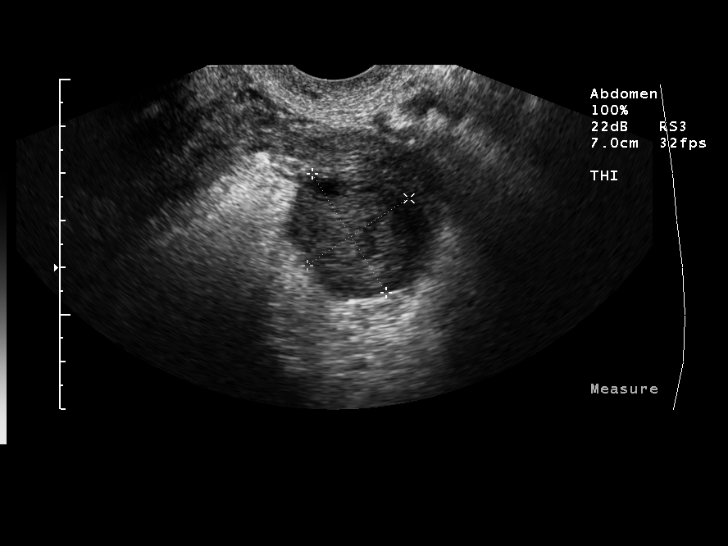
[im 29/35]
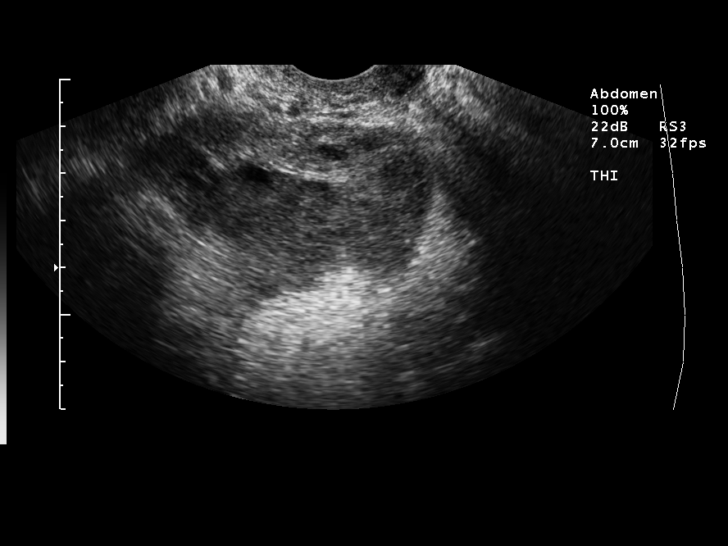
[im 32/35]
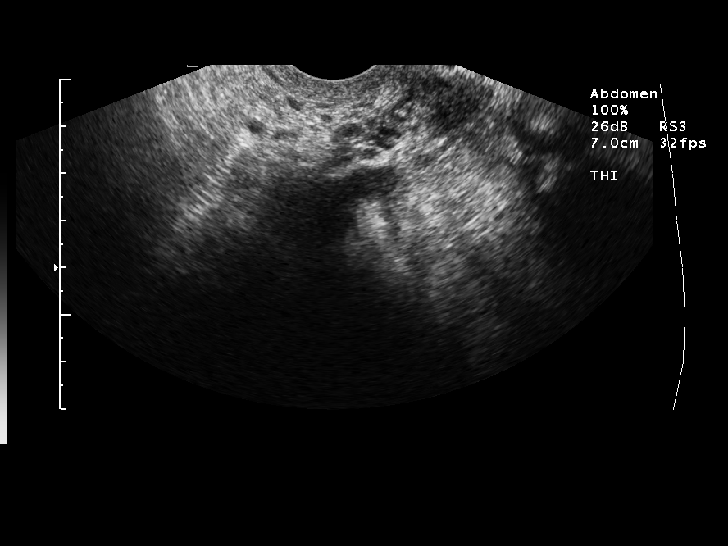
[im 35/35]
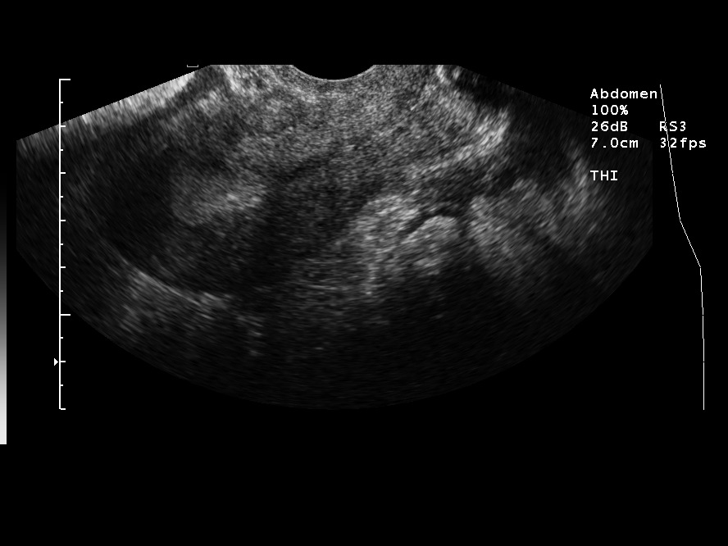

[13 of 25 positions shown; findings below may reference images not displayed]

FINDINGS: Overall uterine size and contour are within normal limits.  The uterus measures 8.8 X 5.3 X 4.6 cm (length X AP X width).  The endometrium is mildly thickened measuring as much as 13 mm.  It is homogeneously thickened without focal mass.  Nonetheless this requires correlation with the patient?s menstrual cycle.  If spotting persists, one might consider repeating the study to further assess the endometrium.  Based on the clinical data provided (LMP [DATE]), the patient should be in the proliferative phase and if this is the cause, then the endometrium is thickened. 
 Ovaries are unremarkable but there is a suggestion of a curvilinear structure partially enveloping the left ovary which could be a fallopian tube filled with echodense fluid.  It also could be a hematoma.  This needs careful clinical correlation.  I would recommend follow-up ultrasound in several weeks.   There is a small amount of fluid in the cul-de-sac.  Clinical significance questionable since this small amount of fluid can be seen normally.
IMPRESSION: 1.  The endometrium is homogeneously thickened ? question clinical significance.  This needs clinical correlation.  
 2.  There is a solid or dense fluid-filled structure partially enveloping the left ovary ? question fallopian tube.  Question hematoma.

## 2007-04-23 ENCOUNTER — Ambulatory Visit: Payer: Self-pay | Admitting: Gynecology

## 2007-05-05 ENCOUNTER — Emergency Department (HOSPITAL_COMMUNITY): Admission: EM | Admit: 2007-05-05 | Discharge: 2007-05-05 | Payer: Self-pay | Admitting: Emergency Medicine

## 2007-05-22 ENCOUNTER — Inpatient Hospital Stay (HOSPITAL_COMMUNITY): Admission: AD | Admit: 2007-05-22 | Discharge: 2007-05-23 | Payer: Self-pay | Admitting: Family Medicine

## 2007-09-11 ENCOUNTER — Emergency Department (HOSPITAL_COMMUNITY): Admission: EM | Admit: 2007-09-11 | Discharge: 2007-09-11 | Payer: Self-pay | Admitting: Emergency Medicine

## 2007-09-11 IMAGING — CR DG CERVICAL SPINE COMPLETE 4+V
6 series · 6 of 6 positions shown · non-contrast
Comparison: None

CLINICAL DATA: Left-sided numbness and tingling.  Neck pain.

CERVICAL SPINE - COMPLETE 4+ VIEW

[w c-spine lat]
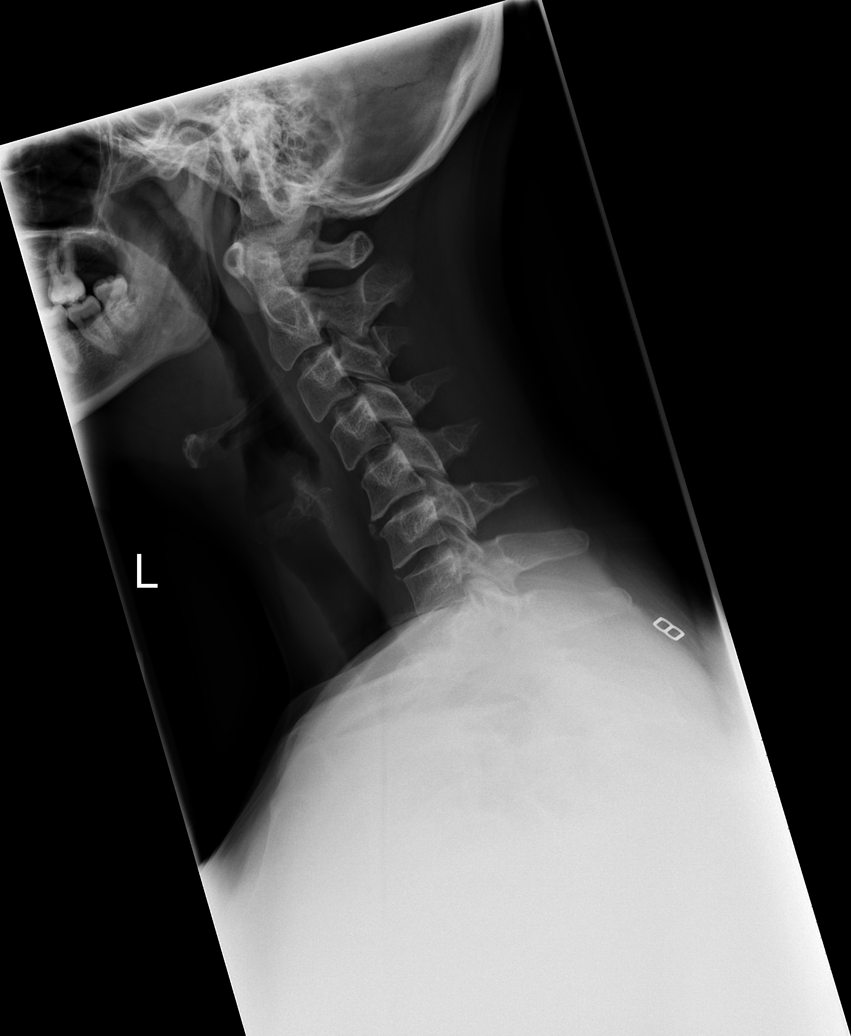

[w c-spine oblique (1 of 2)]
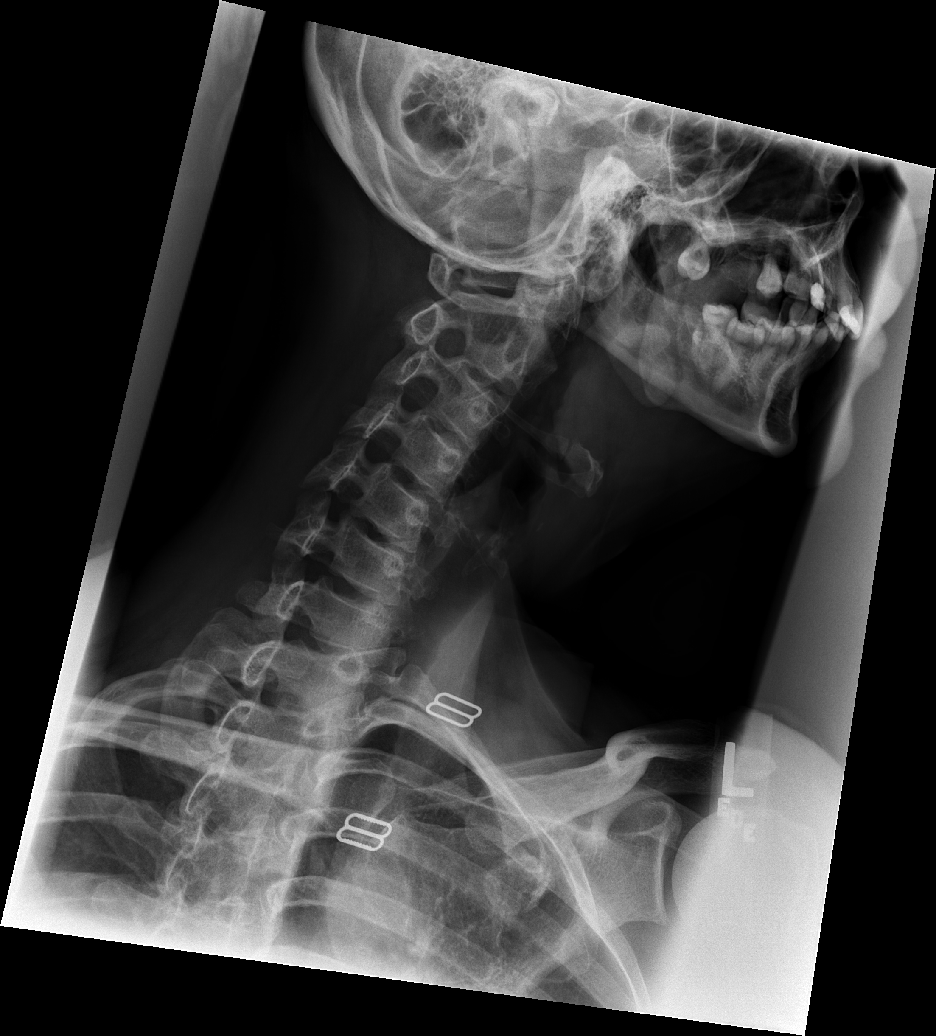

[w c-spine oblique (2 of 2)]
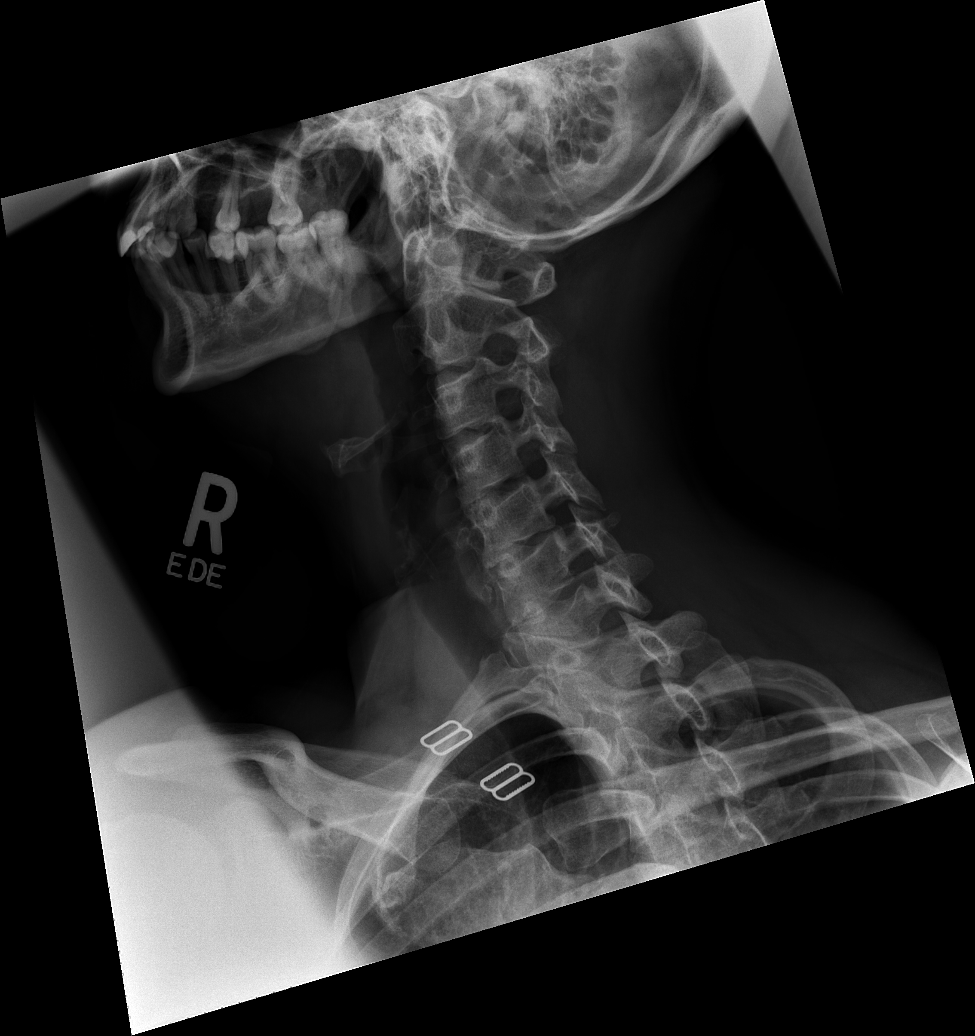

[w c-spine a.p.]
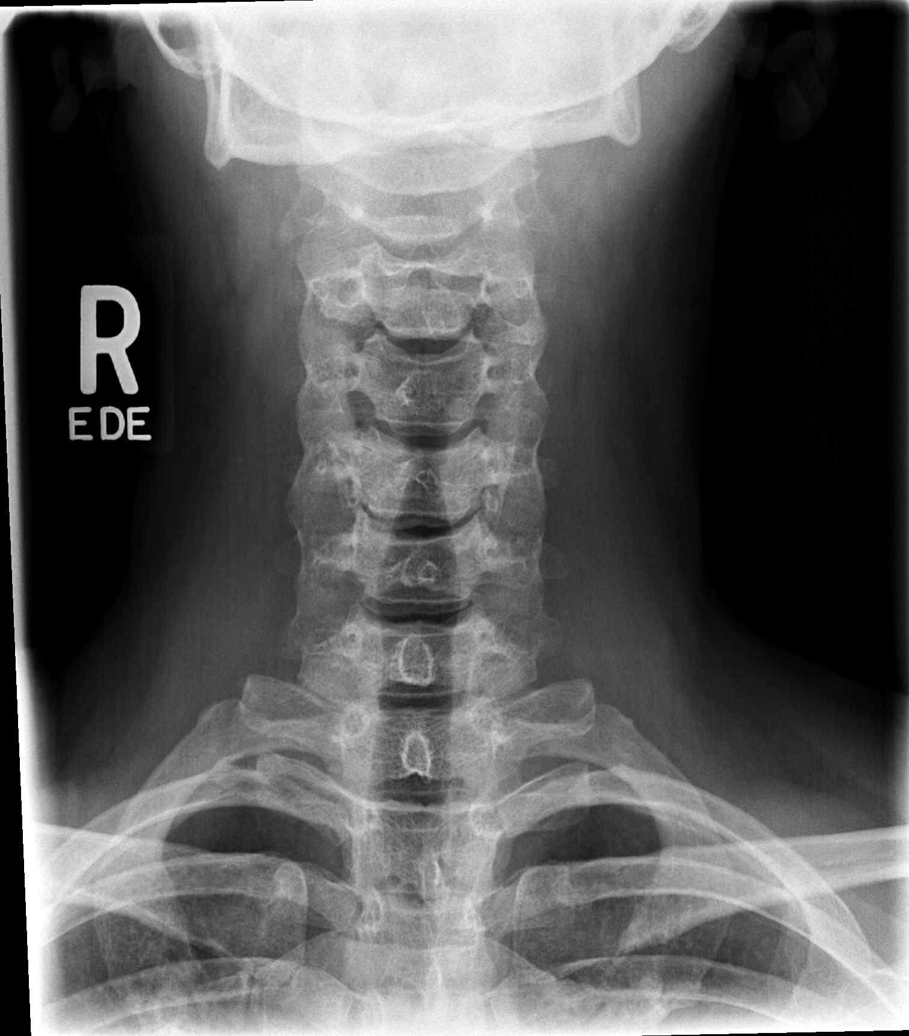

[w c-spine odontoid]
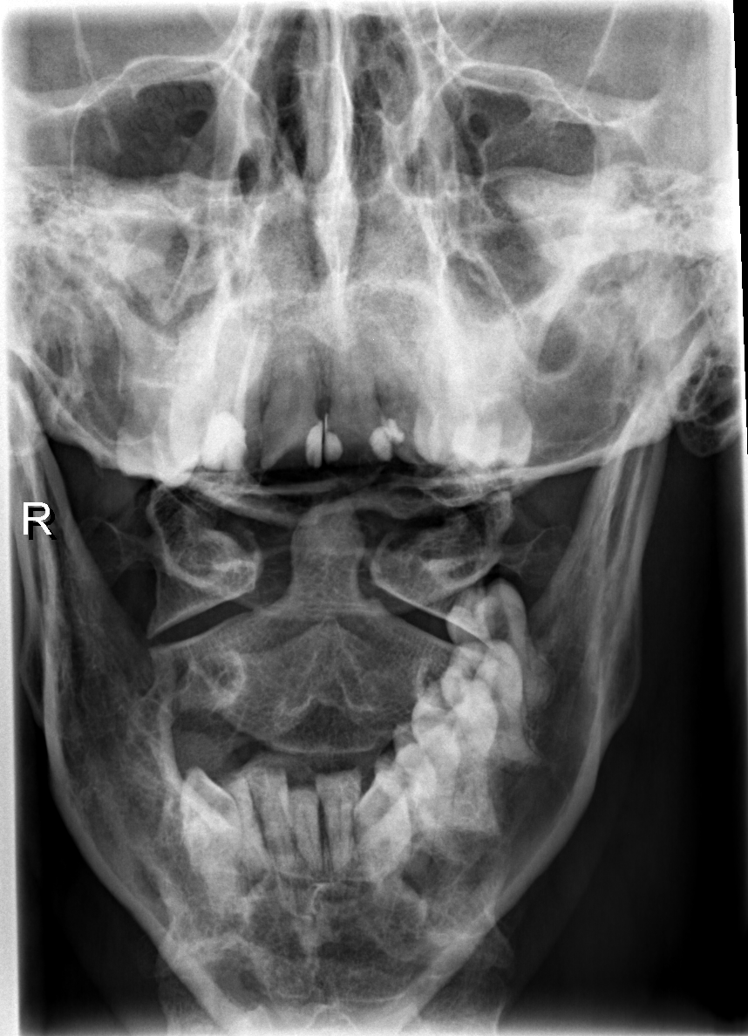

[w swimmers view]
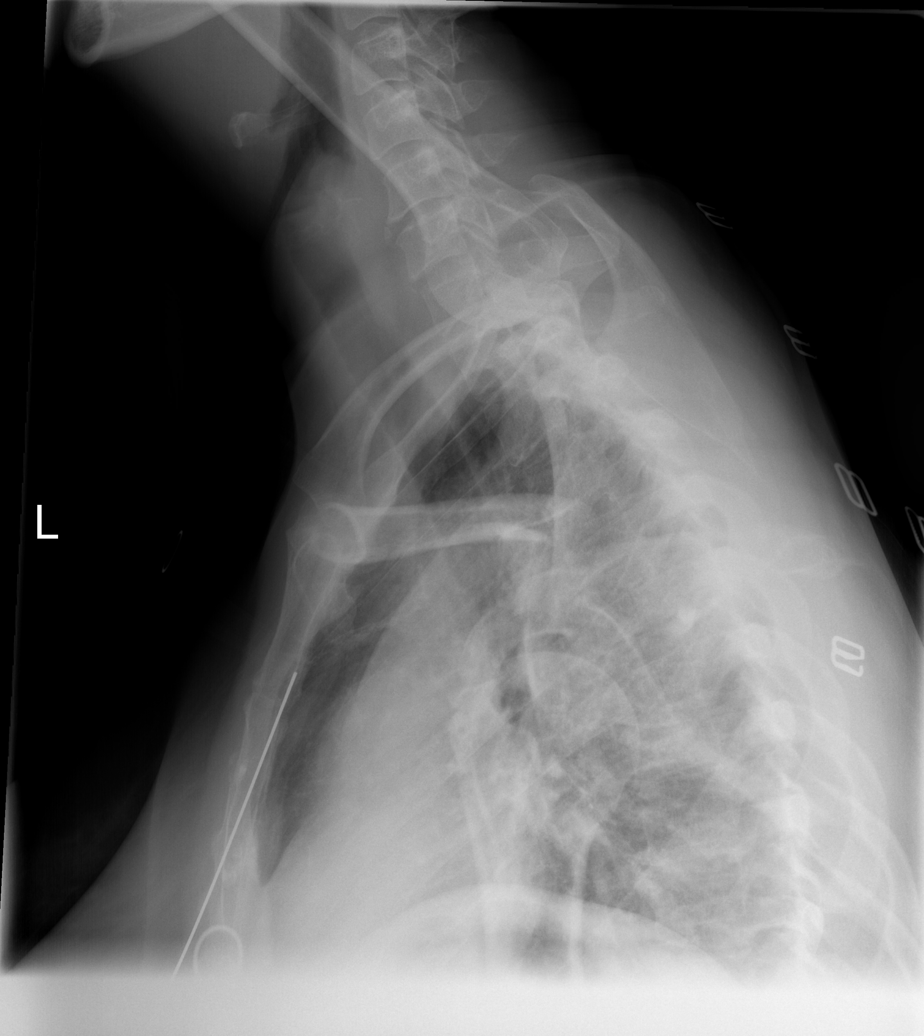

[6 of 6 positions shown; findings below may reference images not displayed]

FINDINGS: There is loss of cervical lordosis.  Mild spondylosis at
C5-6.  No evidence of fracture, listhesis or soft tissue
abnormality.
IMPRESSION: Loss of cervical lordosis with spondylosis at C5-C6.

## 2007-09-11 IMAGING — CT CT HEAD W/O CM
1 series · 16 of 28 positions shown, 20 images · non-contrast
Comparison: None

CLINICAL DATA: Headache.  Left sided numbness and weakness.

CT HEAD WITHOUT CONTRAST
TECHNIQUE: Contiguous axial images were obtained from the base of
the skull through the vertex without contrast.

[Series 2: brain · axial · 0.47mm/px · z∈[+135,+263]mm · 16 of 28 slices shown, 20 images]
[im 2/28  brain]
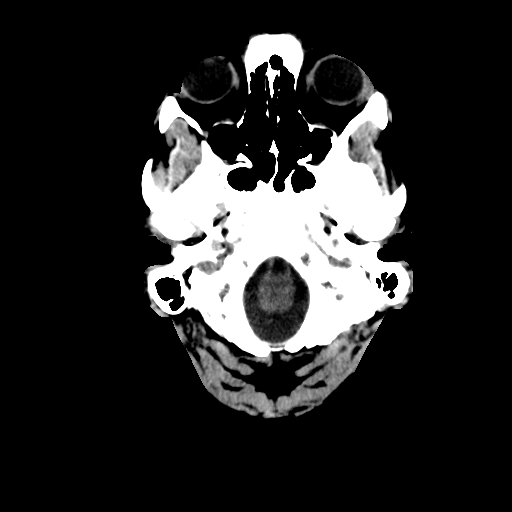
[im 2/28  bone]
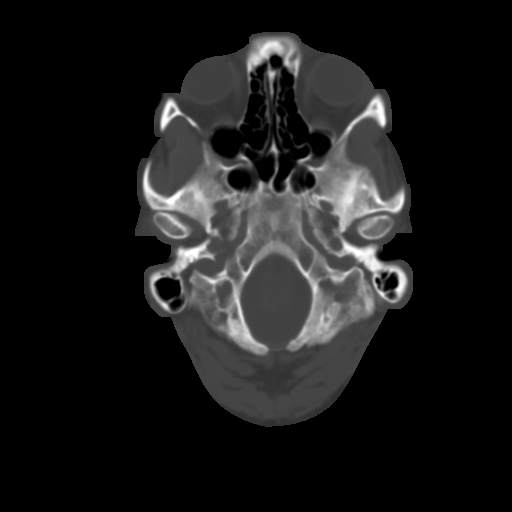
[im 4/28  brain]
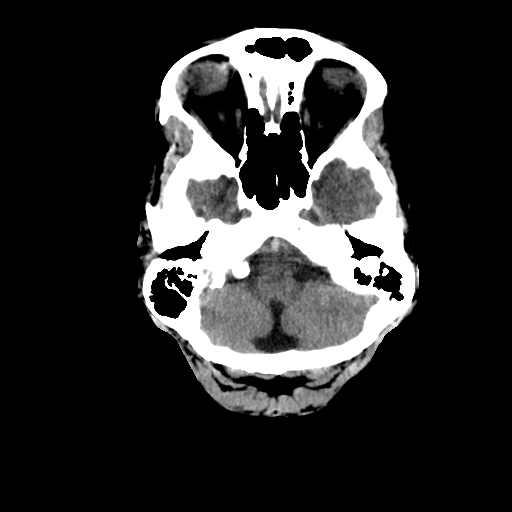
[im 6/28  brain]
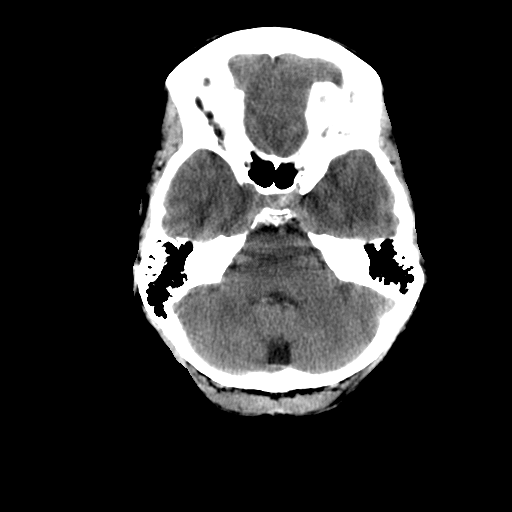
[im 7/28  brain]
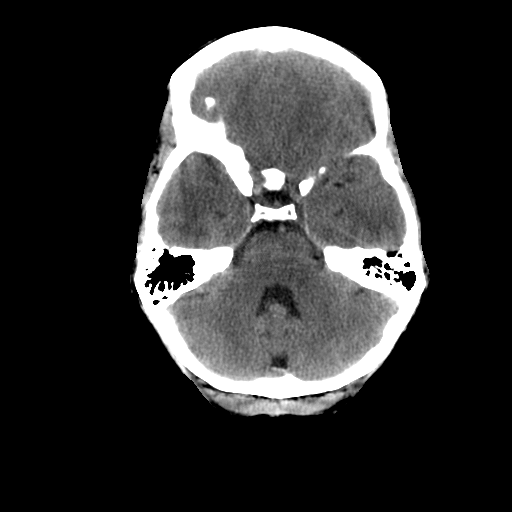
[im 9/28  brain]
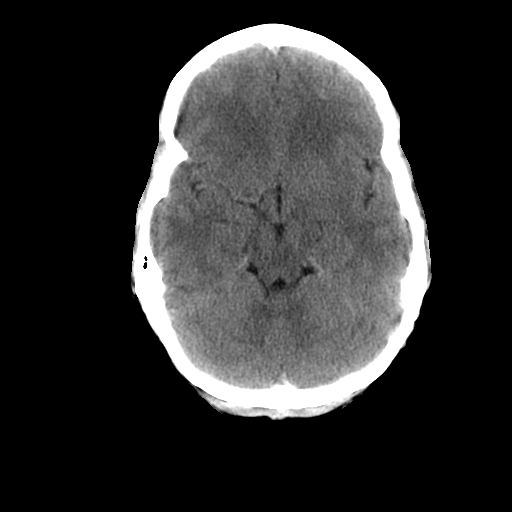
[im 9/28  bone]
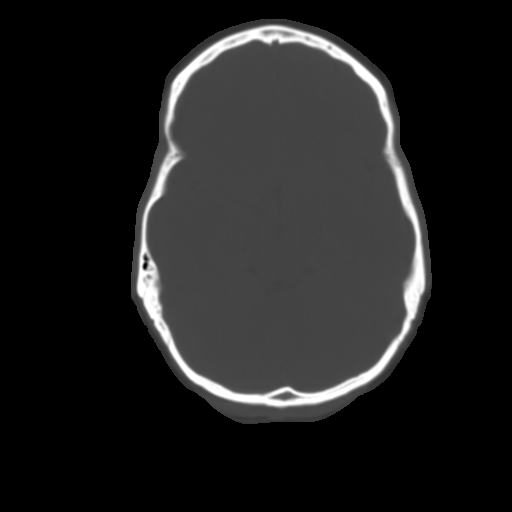
[im 10/28  brain]
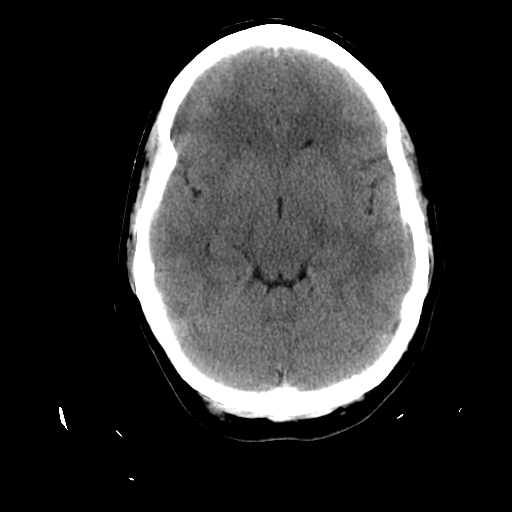
[im 12/28  brain]
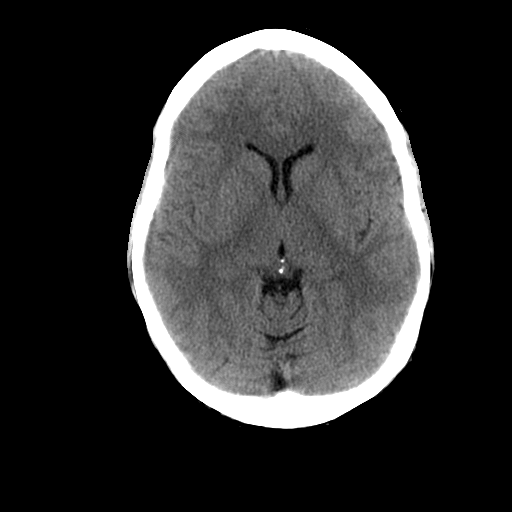
[im 14/28  brain]
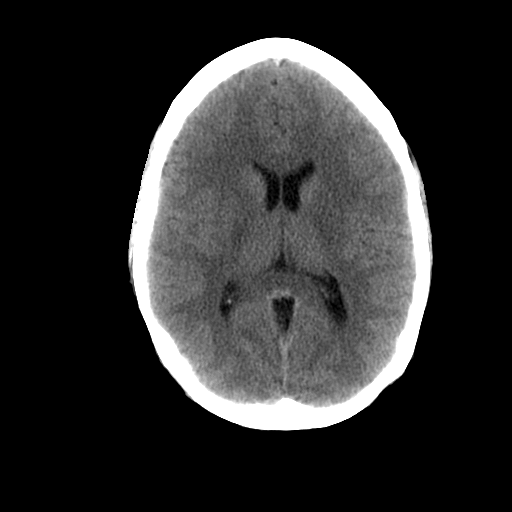
[im 15/28  brain]
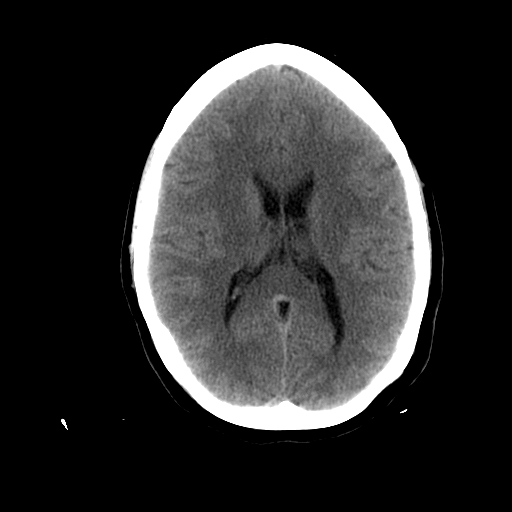
[im 15/28  bone]
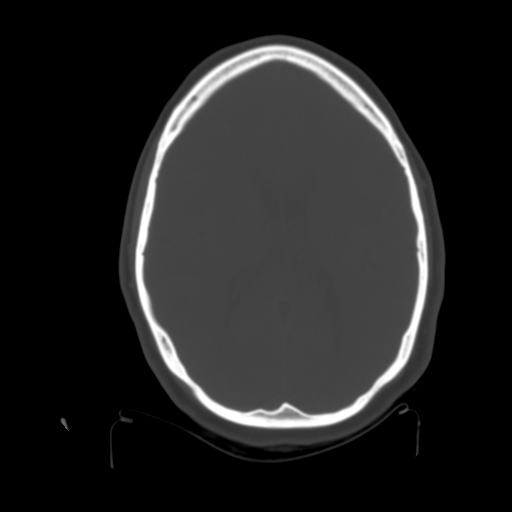
[im 17/28  brain]
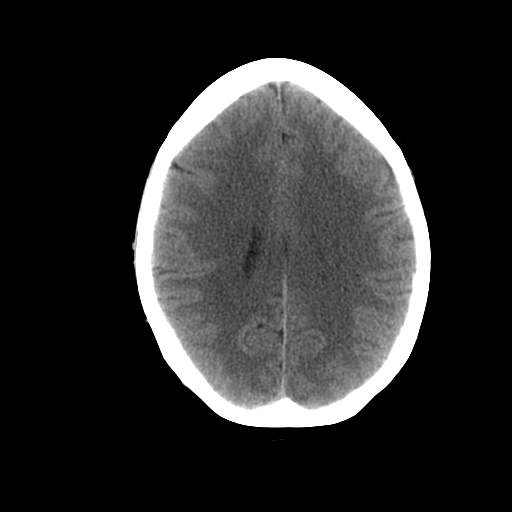
[im 19/28  brain]
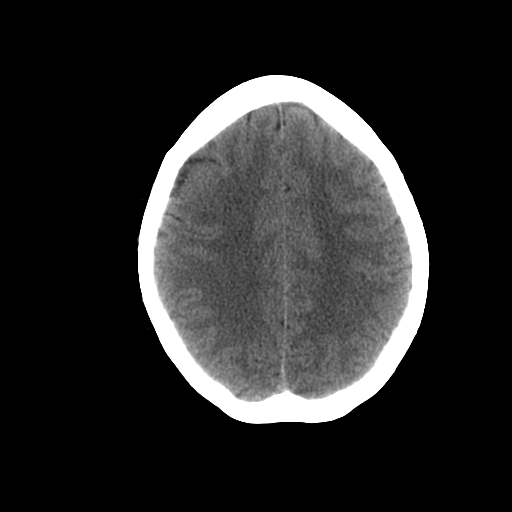
[im 20/28  brain]
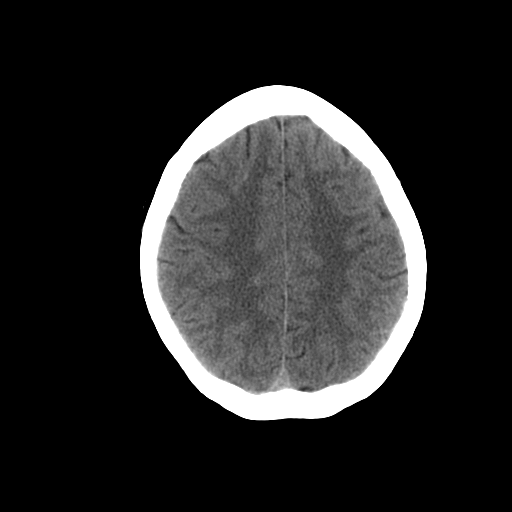
[im 22/28  brain]
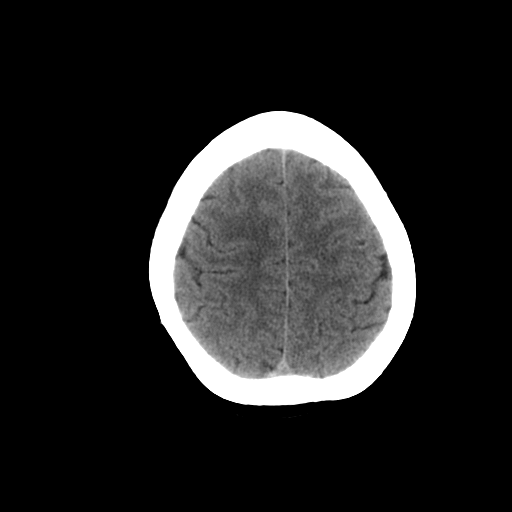
[im 22/28  bone]
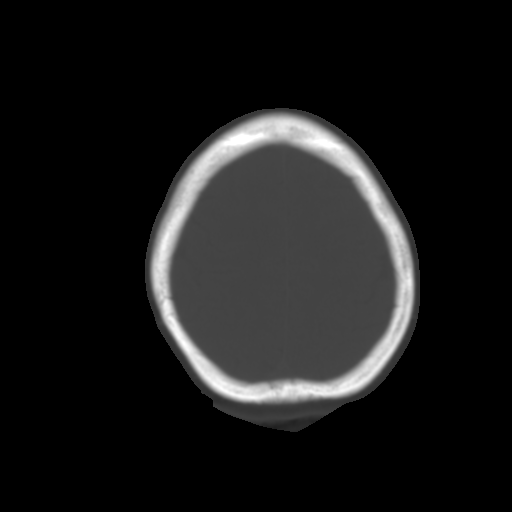
[im 23/28  brain]
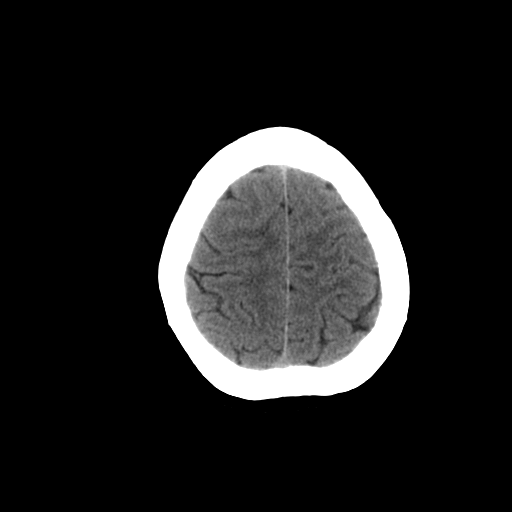
[im 25/28  brain]
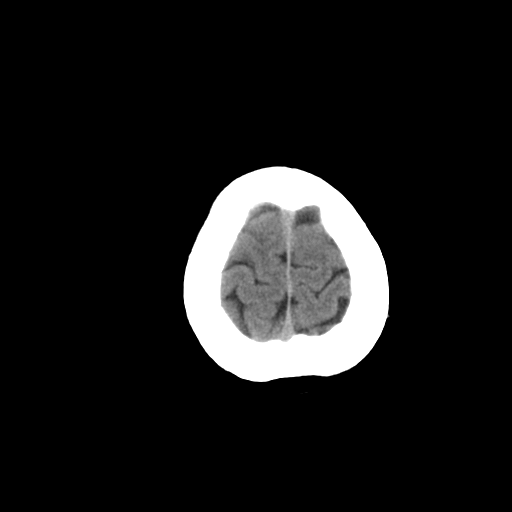
[im 27/28  brain]
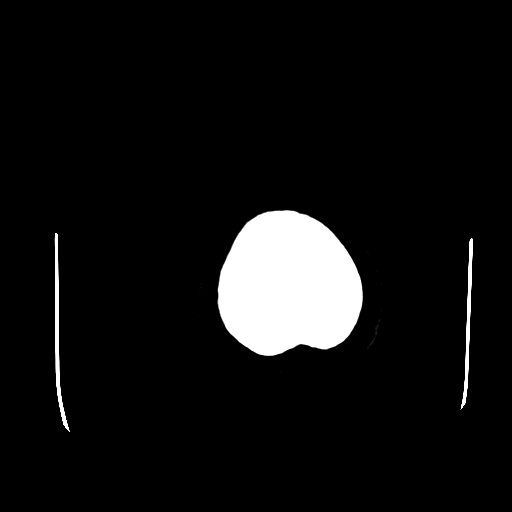

[16 of 28 positions shown; findings below may reference images not displayed]

FINDINGS: The brain has a normal appearance without evidence of
atrophy, infarction, mass lesion, hemorrhage, hydrocephalus or
extra-axial collection.  The calvarium is unremarkable.  The
sinuses are clear.
IMPRESSION: Normal examination

## 2007-10-09 ENCOUNTER — Inpatient Hospital Stay (HOSPITAL_COMMUNITY): Admission: AD | Admit: 2007-10-09 | Discharge: 2007-10-10 | Payer: Self-pay | Admitting: Obstetrics & Gynecology

## 2007-10-09 IMAGING — US US PELVIS COMPLETE MODIFY
1 series · 13 of 25 positions shown · non-contrast
Comparison: [DATE]

CLINICAL DATA: Abdominal pain.

TRANSABDOMINAL AND TRANSVAGINAL ULTRASOUND OF PELVIS
TECHNIQUE: Both transabdominal and transvaginal ultrasound
examinations of the pelvis were performed including evaluation of
the uterus, ovaries, adnexal regions, and pelvic cul-de-sac.

[Series 1: us pelvis complete modify · 0.30mm/px · 13 of 54 slices shown]
[im 1/54]
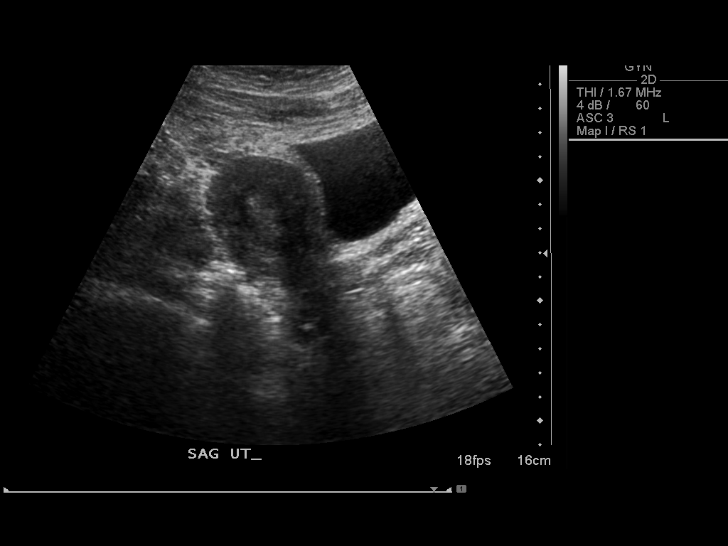
[im 5/54]
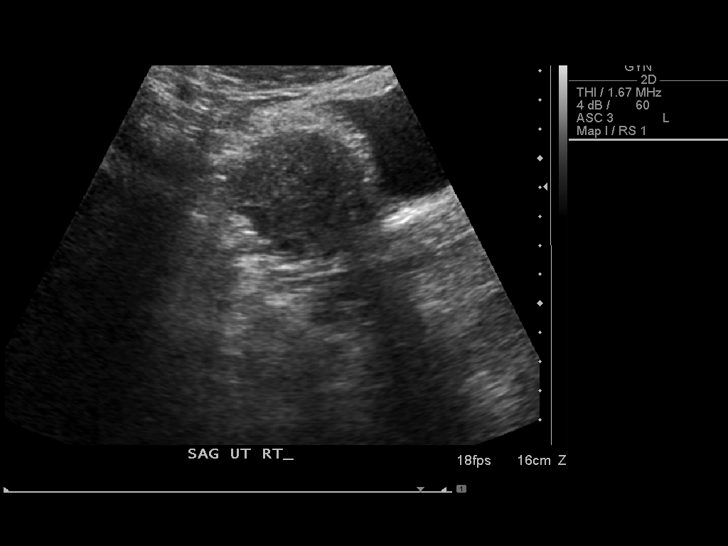
[im 9/54]
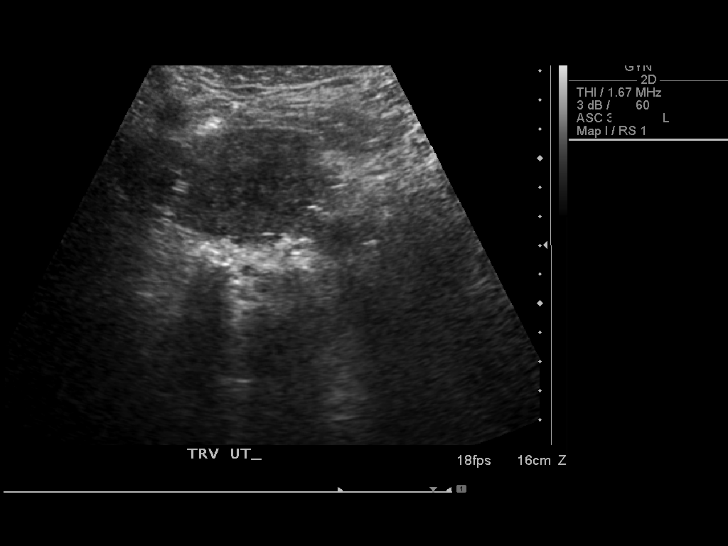
[im 14/54]
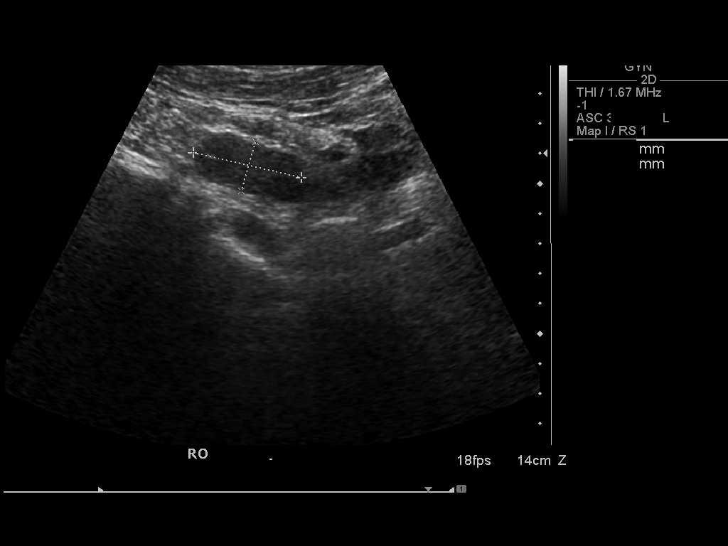
[im 18/54]
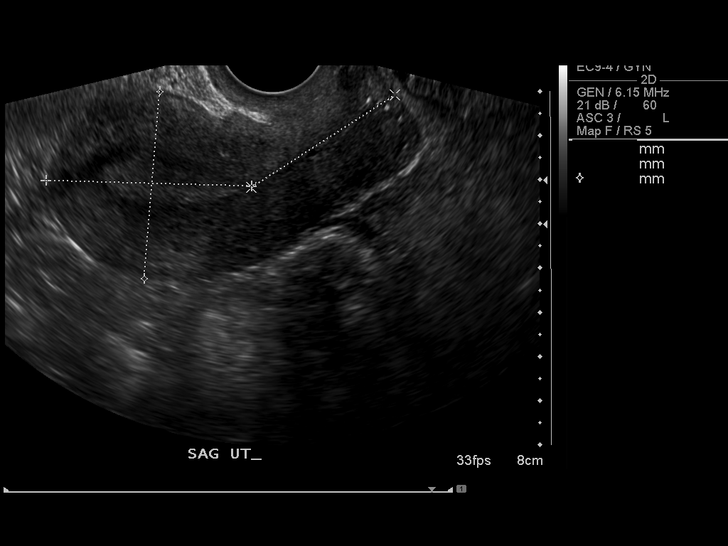
[im 23/54]
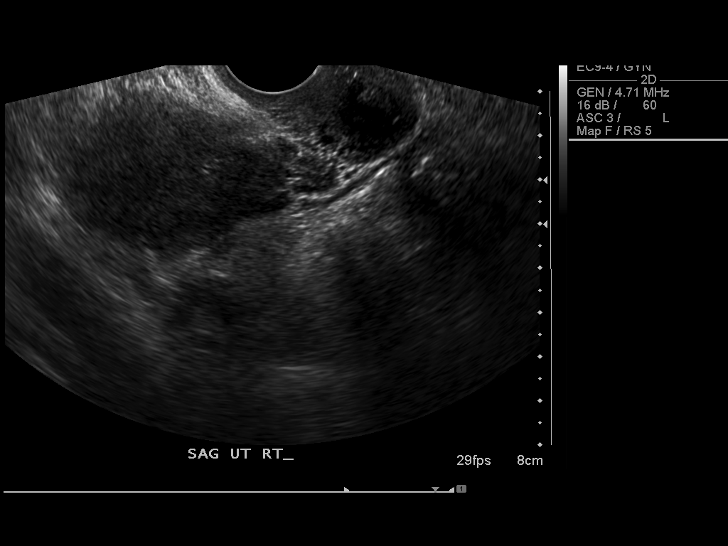
[im 27/54]
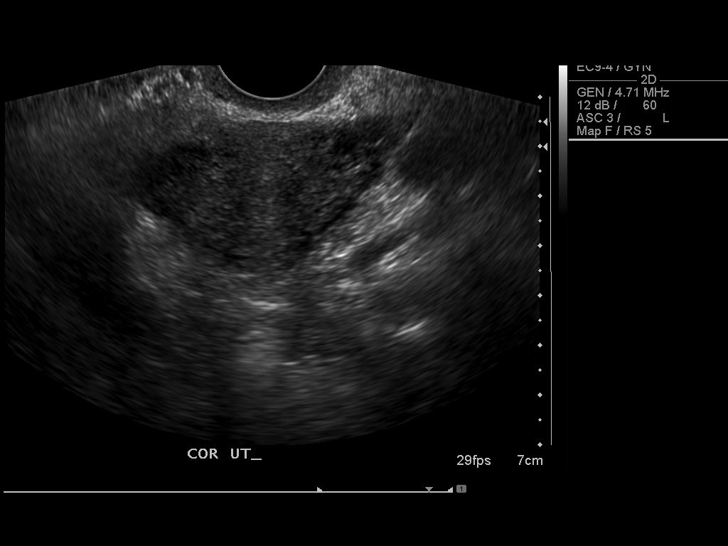
[im 31/54]
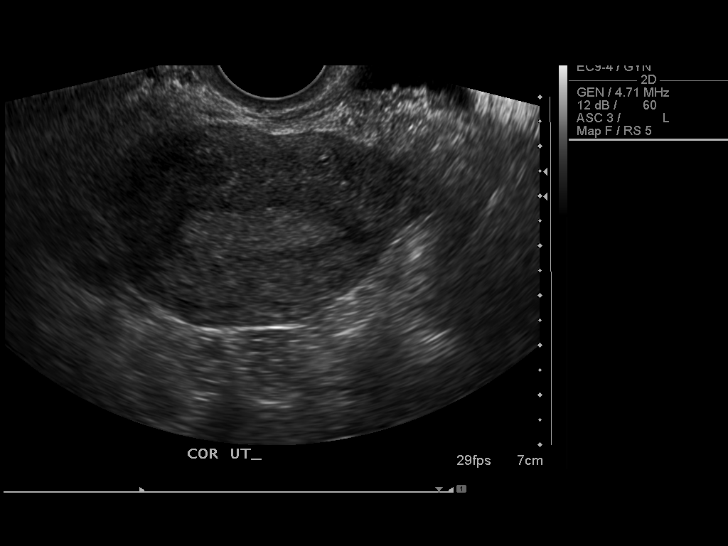
[im 36/54]
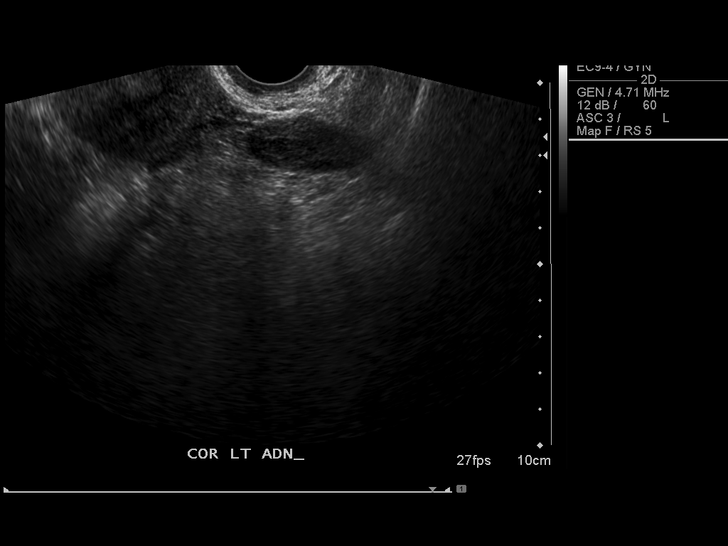
[im 40/54]
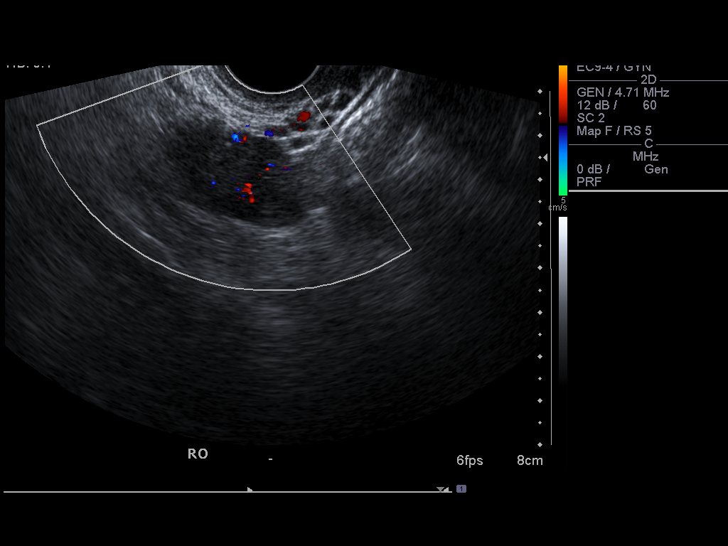
[im 45/54]
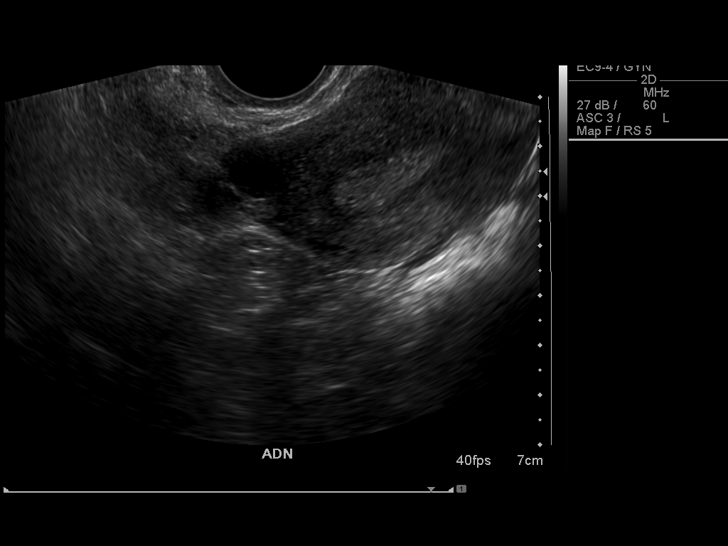
[im 49/54]
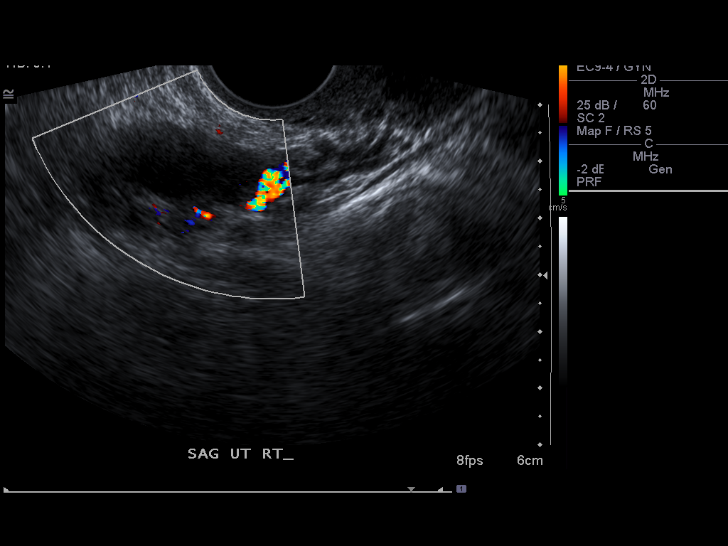
[im 54/54]
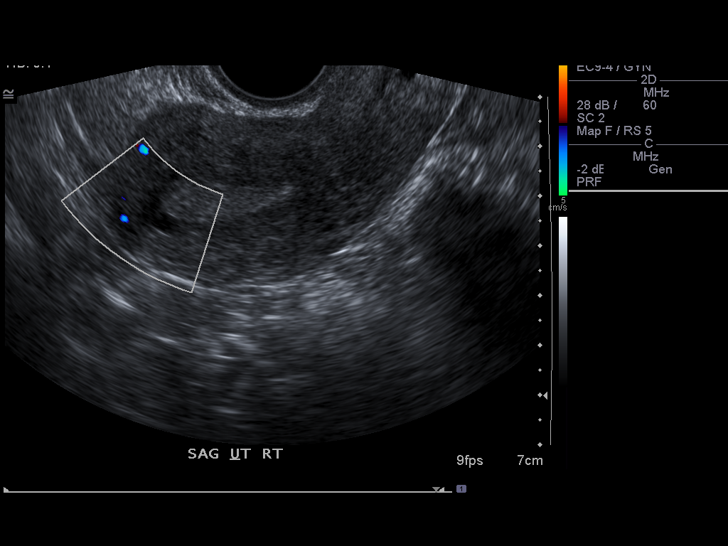

[13 of 25 positions shown; findings below may reference images not displayed]

FINDINGS: The uterus measures 8.5 cm in greatest length.  The
endometrium measures 10 mm in thickness, and near the fundal margin
of the endometrium there is a small anechoic structure measuring 4
mm in diameter potentially representing a small cyst or a cystic
submucosal fibroid.  Because this is at the margin of the
endometrium and the myometrium, I would doubt that this represents
pregnancy, although correlation with pregnancy test would be
recommended.  A small focus of adenomyosis the adjacent to the
endometrium is also a possibility.

The right ovary measures 4.0 x 2.0 x 2.4 cm and the left ovary
measures 3.3 x 1.5 x 3.1 cm.  There is expected color flow in the
ovarian parenchyma, although Doppler wave form analysis was not
performed.

A cystic lesion adjacent to the uterus compatible with right
parametrial cyst measures 2.1 x 1.0 x 1.0 cm.  No free pelvic fluid
is identified.
IMPRESSION: 1.  Small cystic lesion at the margin of the fundal endometrium
with the myometrium, possibly representing a small focus of
adenomyosis, a cystic submucosal fibroid, or an endometrial cyst.
Intrauterine pregnancy is considered unlikely given the morphology,
but correlation with pregnancy test is recommended.
2.  Right parametrial cyst is noted, with mildly thick peripheral
wall.  This could represent focal dilatation in the right fallopian
tube or other form of parametrial cyst.  If the patient has a
positive pregnancy test, this would be suspicious for ectopic
pregnancy.  However, no free pelvic fluid is identified to further
support the diagnosis of ectopic pregnancy.  Correlate with
quantitative beta HCG level.

## 2007-12-15 ENCOUNTER — Emergency Department (HOSPITAL_BASED_OUTPATIENT_CLINIC_OR_DEPARTMENT_OTHER): Admission: EM | Admit: 2007-12-15 | Discharge: 2007-12-15 | Payer: Self-pay | Admitting: Emergency Medicine

## 2007-12-18 ENCOUNTER — Emergency Department (HOSPITAL_COMMUNITY): Admission: EM | Admit: 2007-12-18 | Discharge: 2007-12-19 | Payer: Self-pay | Admitting: Emergency Medicine

## 2007-12-21 ENCOUNTER — Emergency Department (HOSPITAL_COMMUNITY): Admission: EM | Admit: 2007-12-21 | Discharge: 2007-12-21 | Payer: Self-pay | Admitting: Emergency Medicine

## 2008-06-02 ENCOUNTER — Emergency Department (HOSPITAL_COMMUNITY): Admission: EM | Admit: 2008-06-02 | Discharge: 2008-06-03 | Payer: Self-pay | Admitting: Emergency Medicine

## 2008-06-21 ENCOUNTER — Emergency Department (HOSPITAL_COMMUNITY): Admission: EM | Admit: 2008-06-21 | Discharge: 2008-06-21 | Payer: Self-pay | Admitting: Emergency Medicine

## 2008-06-21 IMAGING — CT CT HEAD W/O CM
1 series · 16 of 30 positions shown, 20 images · non-contrast
Comparison: CT brain scan [DATE]

CLINICAL DATA: Headache for 3 months which has been worsening in
the last few days

CT HEAD WITHOUT CONTRAST
TECHNIQUE: Contiguous axial images were obtained from the base of
the skull through the vertex without contrast.

[Series 2: head_seq 4.5 h37s st · axial · 0.43mm/px · z∈[-91,+53]mm · 16 of 36 slices shown, 20 images]
[im 2/36  brain]
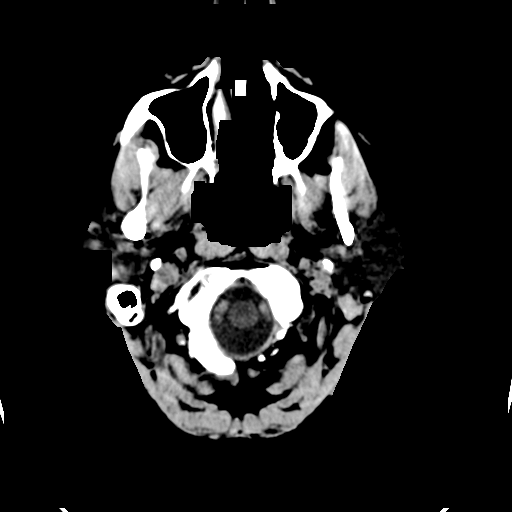
[im 2/36  bone]
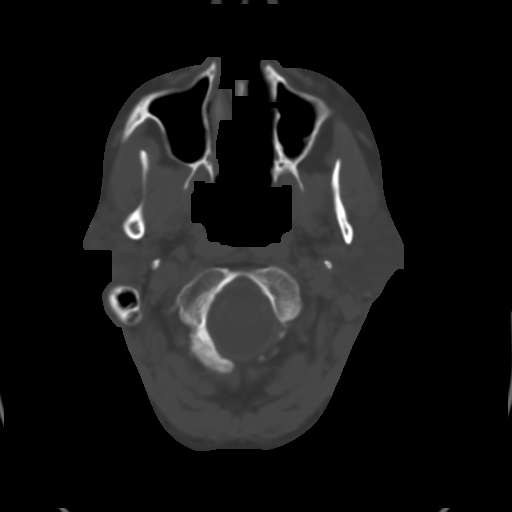
[im 4/36  brain]
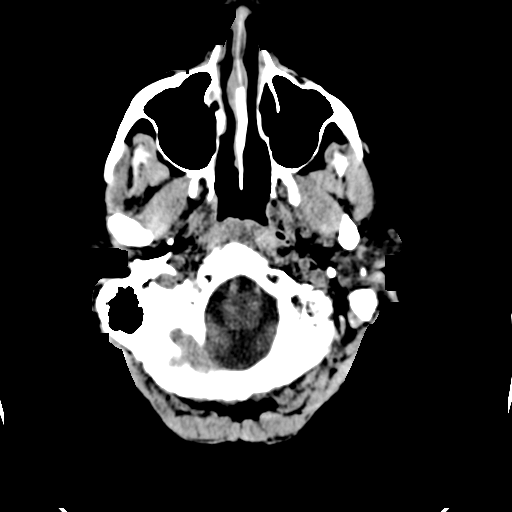
[im 7/36  brain]
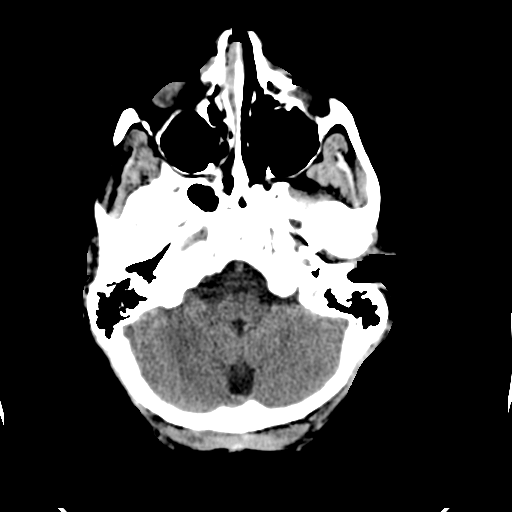
[im 9/36  brain]
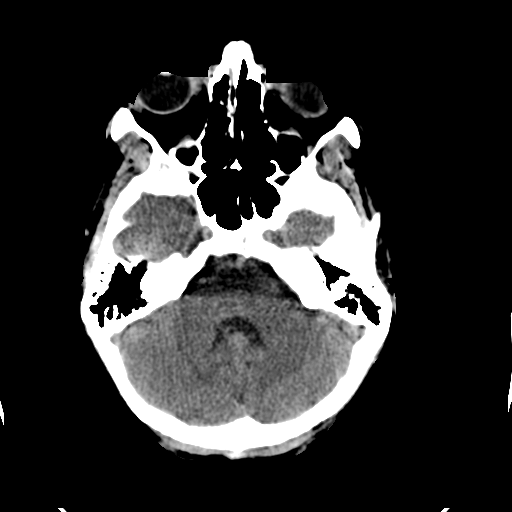
[im 10/36  brain]
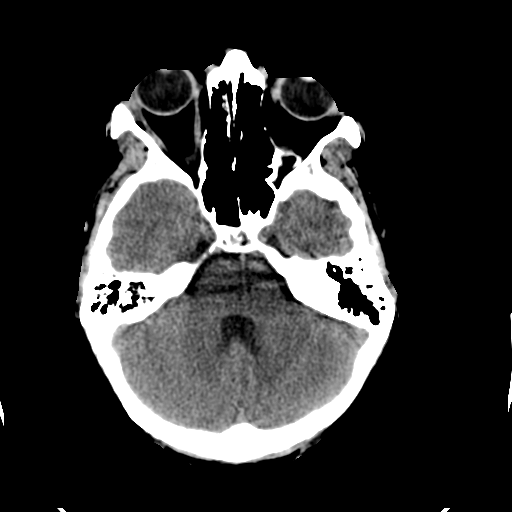
[im 10/36  bone]
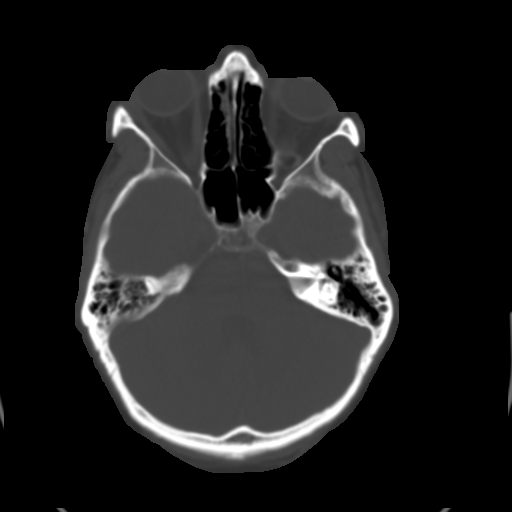
[im 13/36  brain]
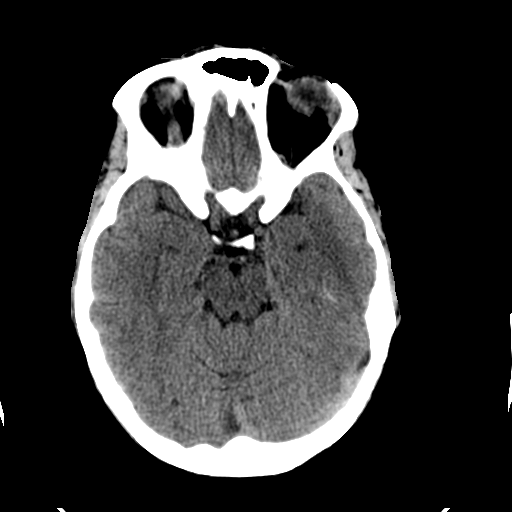
[im 15/36  brain]
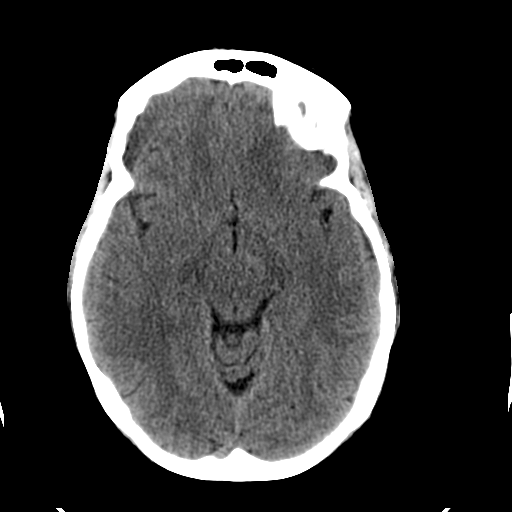
[im 17/36  brain]
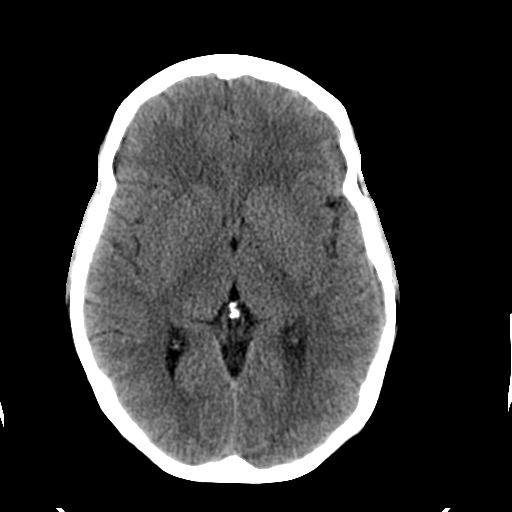
[im 19/36  brain]
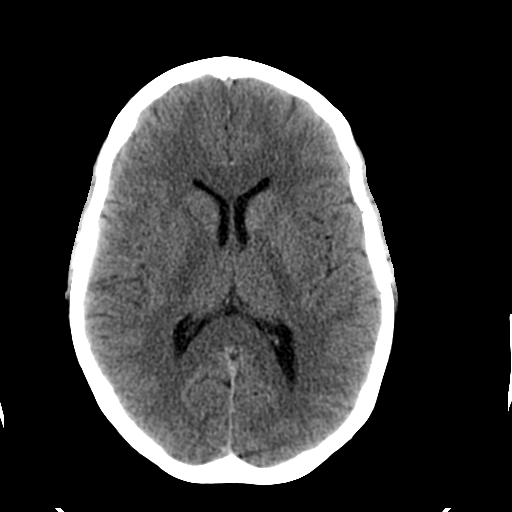
[im 19/36  bone]
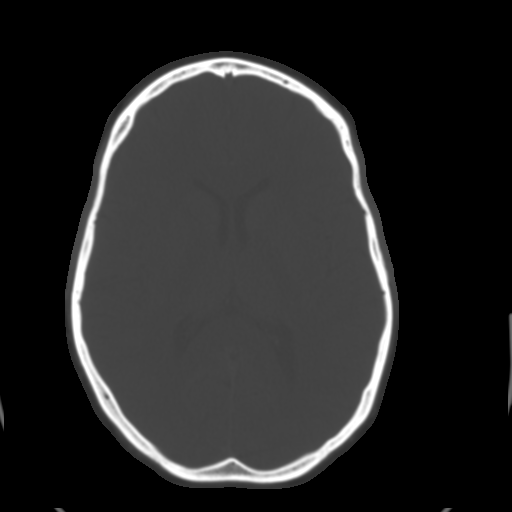
[im 21/36  brain]
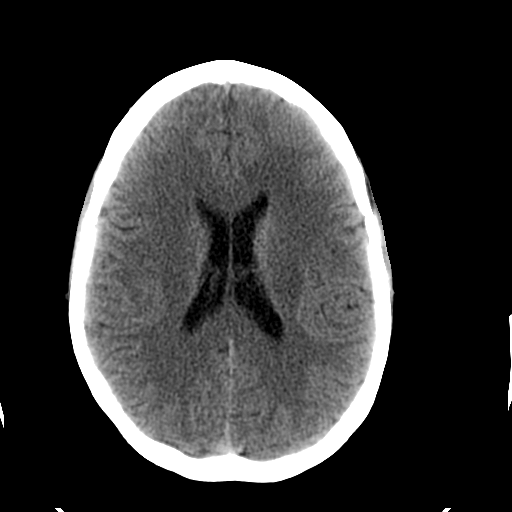
[im 23/36  brain]
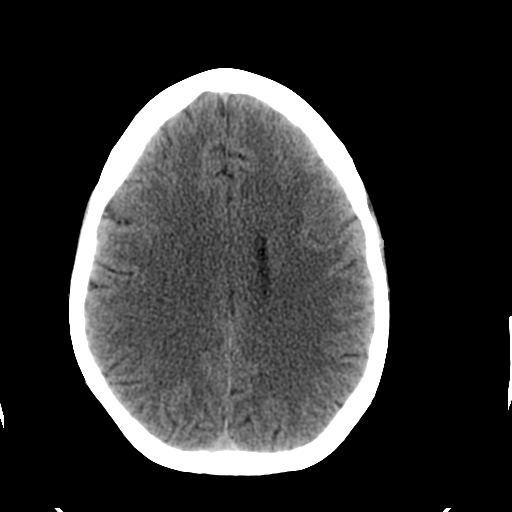
[im 26/36  brain]
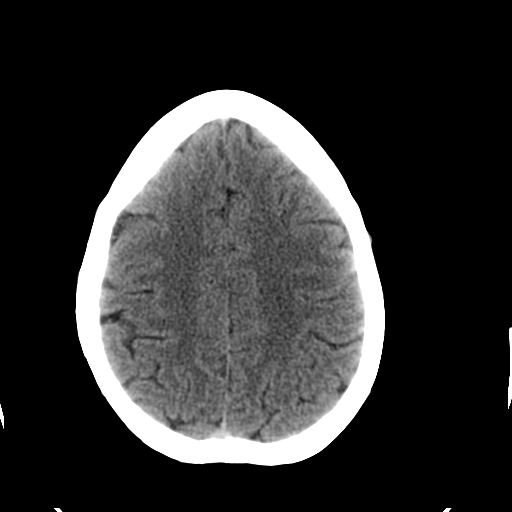
[im 27/36  brain]
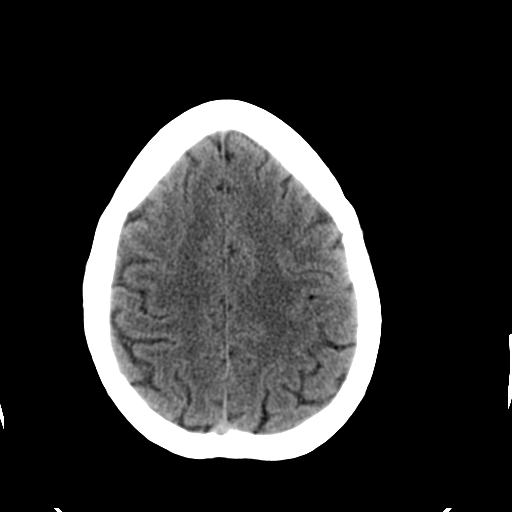
[im 27/36  bone]
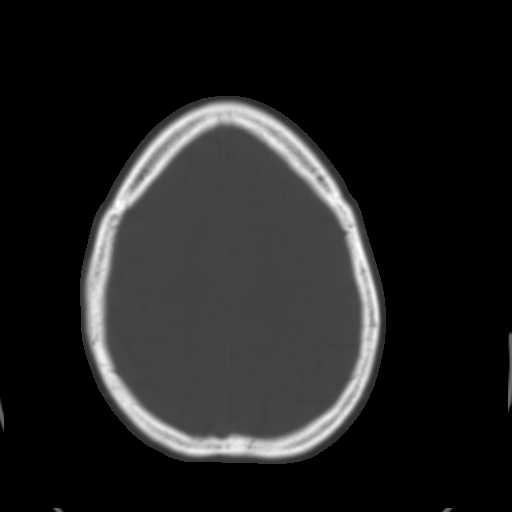
[im 29/36  brain]
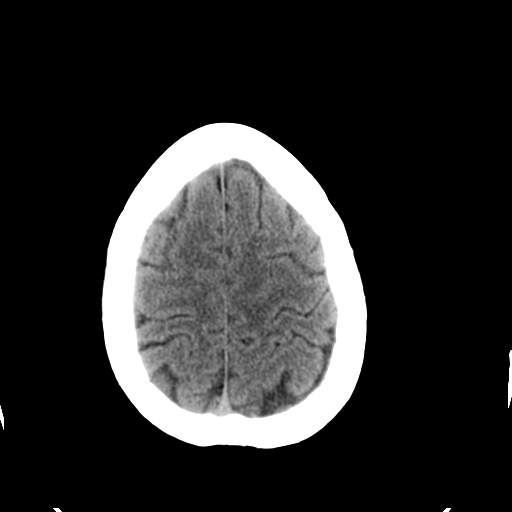
[im 32/36  brain]
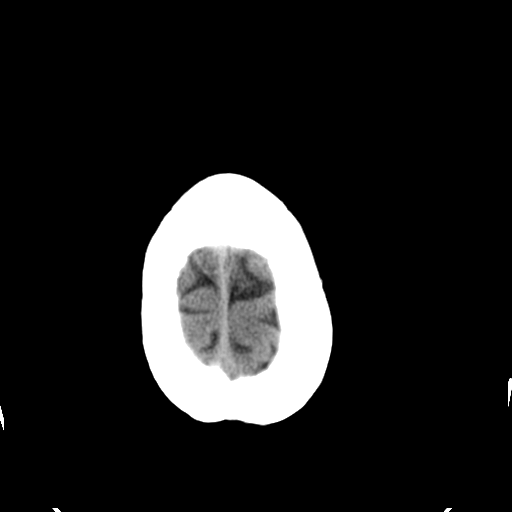
[im 34/36  brain]
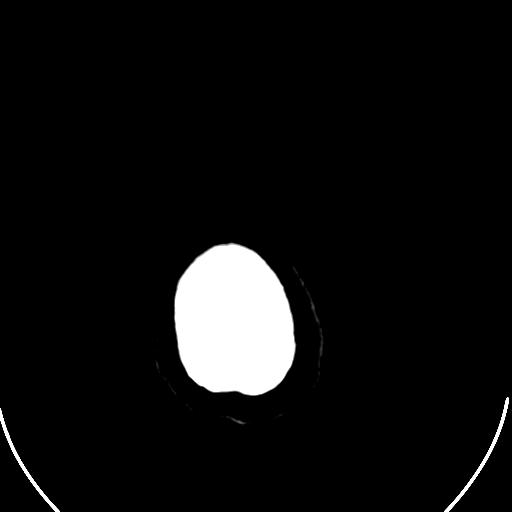

[16 of 30 positions shown; findings below may reference images not displayed]

FINDINGS: The ventricular system is normal in size and
configuration and the septum remains in a normal midline position.
The fourth ventricle and basilar cisterns appear normal.  No blood,
edema, or mass effect is seen.  No bony abnormality is noted.
IMPRESSION: No acute intracranial abnormality.

REF:G1 DICTATED: [DATE] [DATE]

## 2008-07-21 ENCOUNTER — Emergency Department: Payer: Self-pay | Admitting: Unknown Physician Specialty

## 2008-07-28 ENCOUNTER — Emergency Department: Payer: Self-pay | Admitting: Emergency Medicine

## 2008-07-30 ENCOUNTER — Emergency Department (HOSPITAL_COMMUNITY): Admission: EM | Admit: 2008-07-30 | Discharge: 2008-07-30 | Payer: Self-pay | Admitting: Emergency Medicine

## 2008-09-09 ENCOUNTER — Ambulatory Visit: Payer: Self-pay | Admitting: Obstetrics & Gynecology

## 2008-09-09 ENCOUNTER — Encounter: Payer: Self-pay | Admitting: Family Medicine

## 2008-09-09 LAB — CONVERTED CEMR LAB
Antibody Screen: NEGATIVE
Basophils Absolute: 0.1 10*3/uL (ref 0.0–0.1)
Basophils Relative: 0 % (ref 0–1)
Eosinophils Absolute: 0.2 10*3/uL (ref 0.0–0.7)
Eosinophils Relative: 1 % (ref 0–5)
HCT: 36 % (ref 36.0–46.0)
Hemoglobin: 11.9 g/dL — ABNORMAL LOW (ref 12.0–15.0)
Hepatitis B Surface Ag: NEGATIVE
Lymphocytes Relative: 20 % (ref 12–46)
Lymphs Abs: 3.2 10*3/uL (ref 0.7–4.0)
MCHC: 33.1 g/dL (ref 30.0–36.0)
MCV: 95 fL (ref 78.0–100.0)
Monocytes Absolute: 1 10*3/uL (ref 0.1–1.0)
Monocytes Relative: 7 % (ref 3–12)
Neutro Abs: 11.4 10*3/uL — ABNORMAL HIGH (ref 1.7–7.7)
Neutrophils Relative %: 72 % (ref 43–77)
Platelets: 467 10*3/uL — ABNORMAL HIGH (ref 150–400)
RBC: 3.79 M/uL — ABNORMAL LOW (ref 3.87–5.11)
RDW: 14.3 % (ref 11.5–15.5)
Rh Type: POSITIVE
Rubella: 81.9 intl units/mL — ABNORMAL HIGH
WBC: 15.9 10*3/uL — ABNORMAL HIGH (ref 4.0–10.5)
hCG, Beta Chain, Quant, S: 8540.6 milliintl units/mL

## 2008-09-15 ENCOUNTER — Encounter: Payer: Self-pay | Admitting: Family Medicine

## 2008-09-15 ENCOUNTER — Ambulatory Visit: Payer: Self-pay | Admitting: Obstetrics & Gynecology

## 2008-09-15 ENCOUNTER — Ambulatory Visit (HOSPITAL_COMMUNITY): Admission: RE | Admit: 2008-09-15 | Discharge: 2008-09-15 | Payer: Self-pay | Admitting: Obstetrics & Gynecology

## 2008-09-15 LAB — CONVERTED CEMR LAB: hCG, Beta Chain, Quant, S: 4130.5 milliintl units/mL

## 2008-09-15 IMAGING — US US OB COMP LESS 14 WK
1 series · 14 of 28 positions shown · non-contrast
Comparison: none

OBSTETRICAL ULTRASOUND:
 This ultrasound exam was performed in the [HOSPITAL] Ultrasound Department.  The OB US report was generated in the AS system, and faxed to the ordering physician.  This report is also available in [REDACTED] PACS.

[Series 1: us ob comp less 14 wks · 39 acquisitions, 14 frames shown]
[im 2/39]
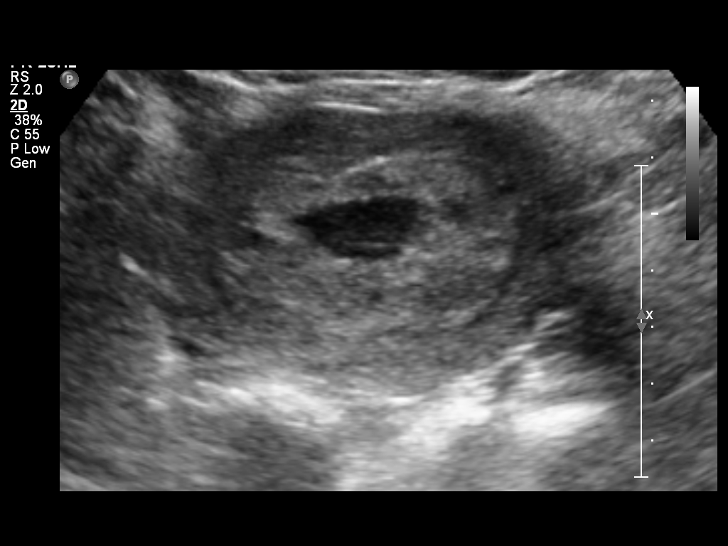
[im 5/39]
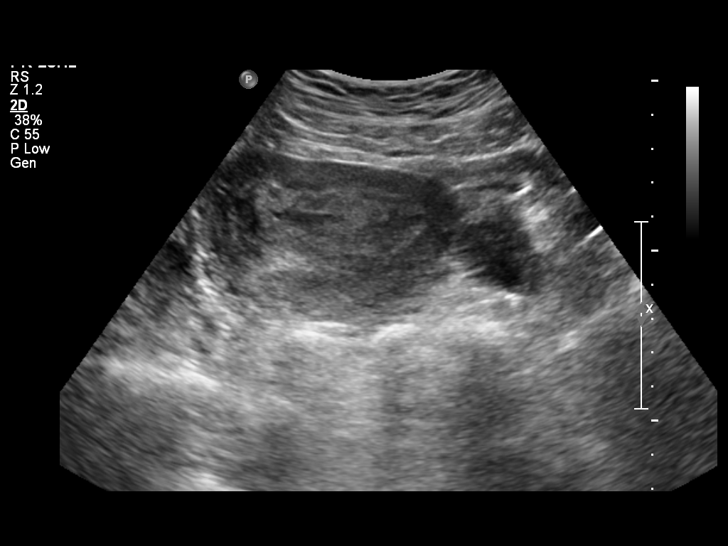
[im 8/39]
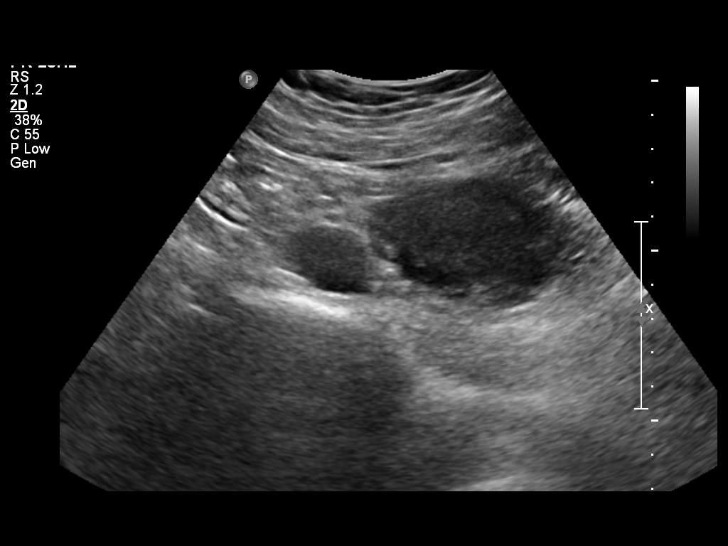
[im 10/39]
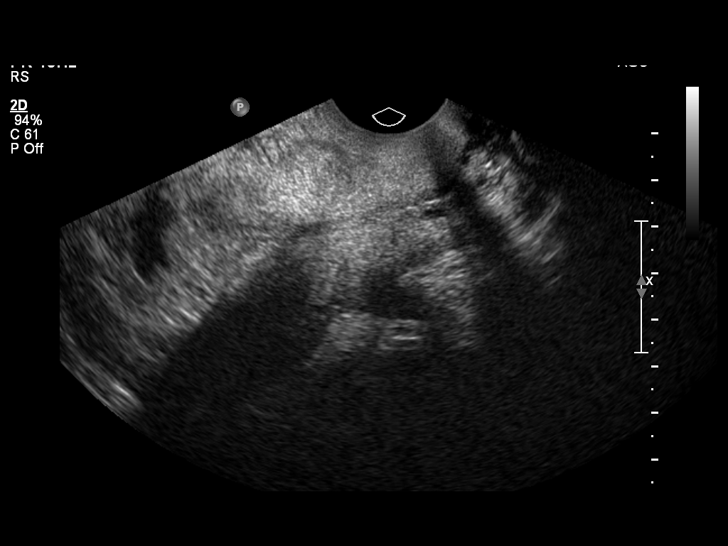
[im 13/39]
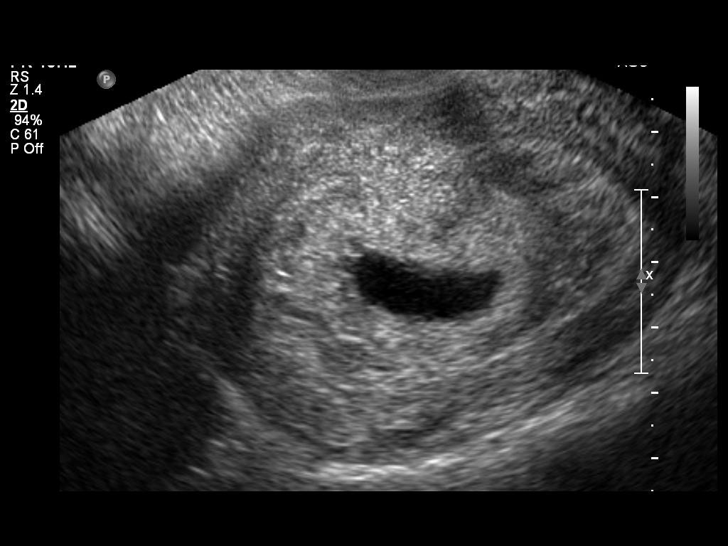
[im 16/39]
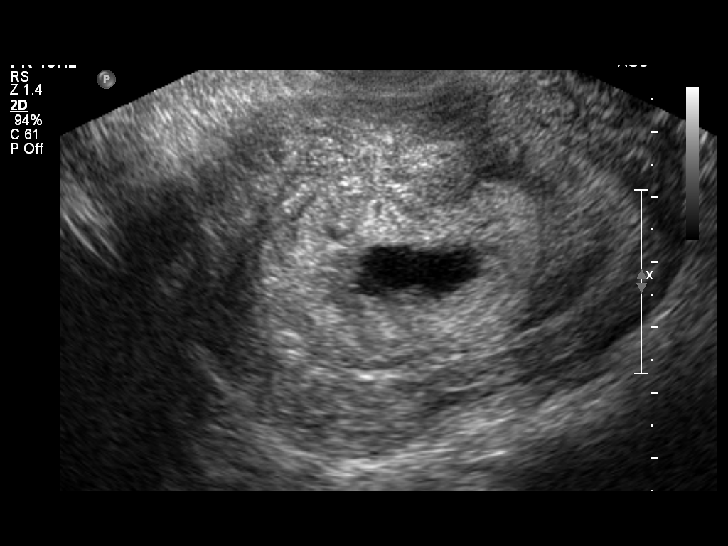
[im 19/39]
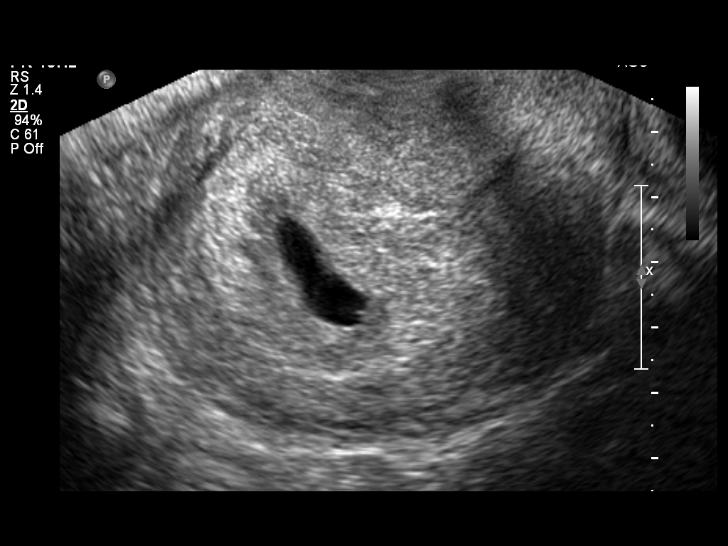
[im 22/39]
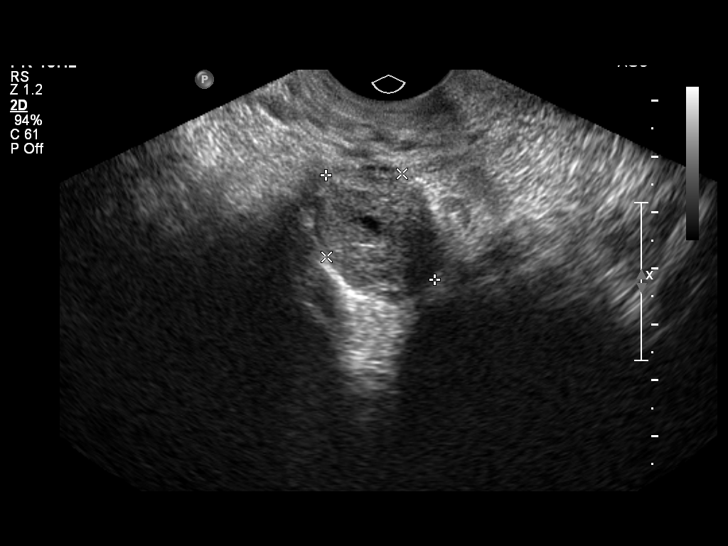
[im 24/39]
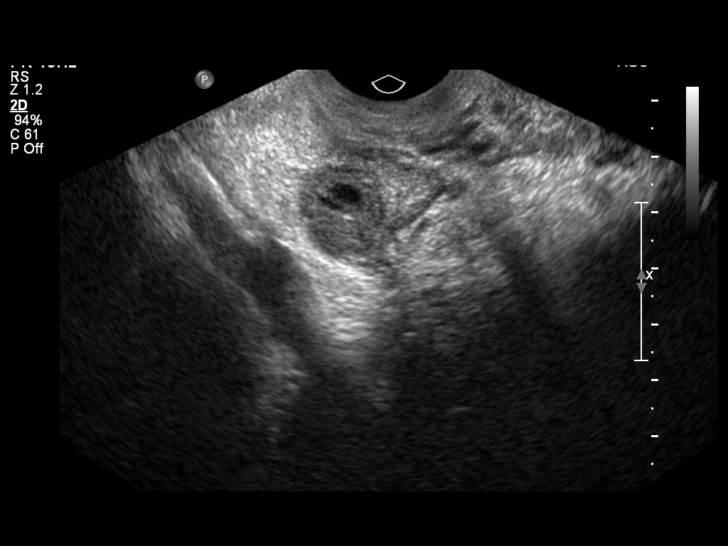
[im 27/39]
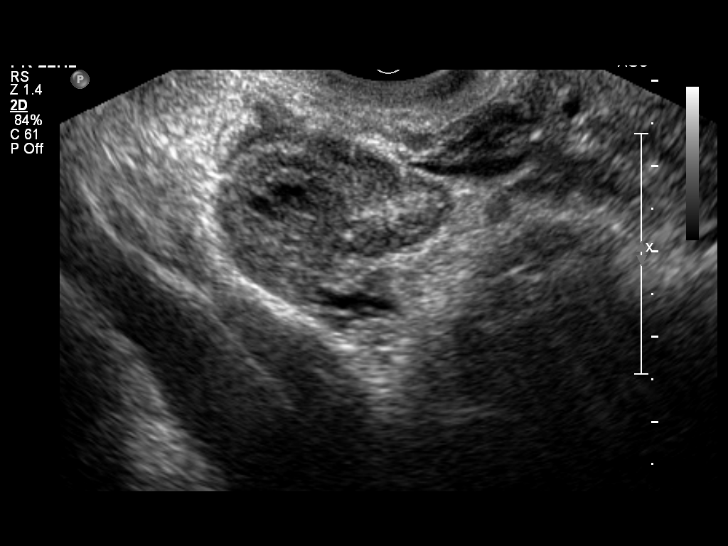
[im 30/39]
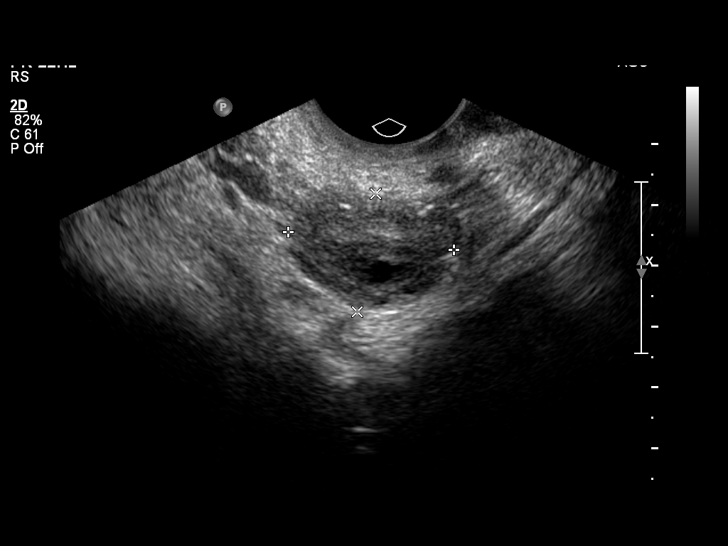
[im 33/39]
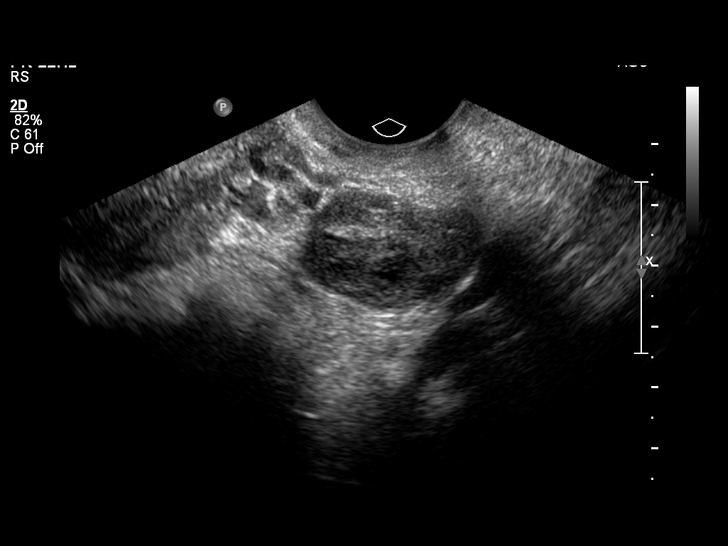
[im 36/39]
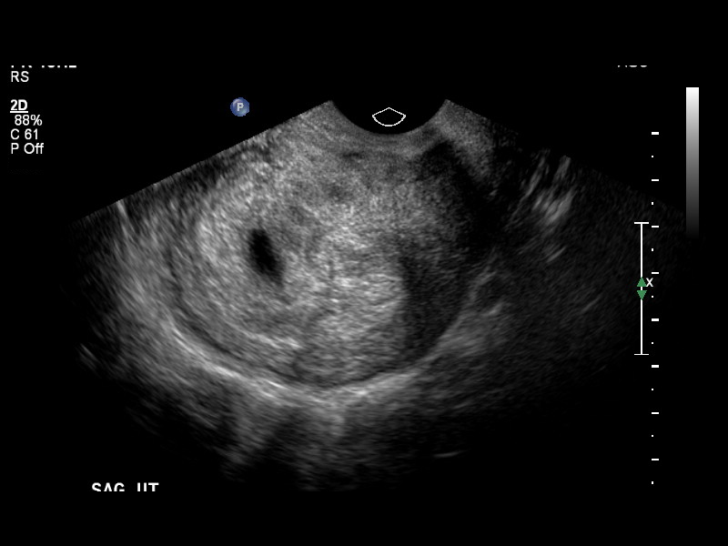
[im 39/39]
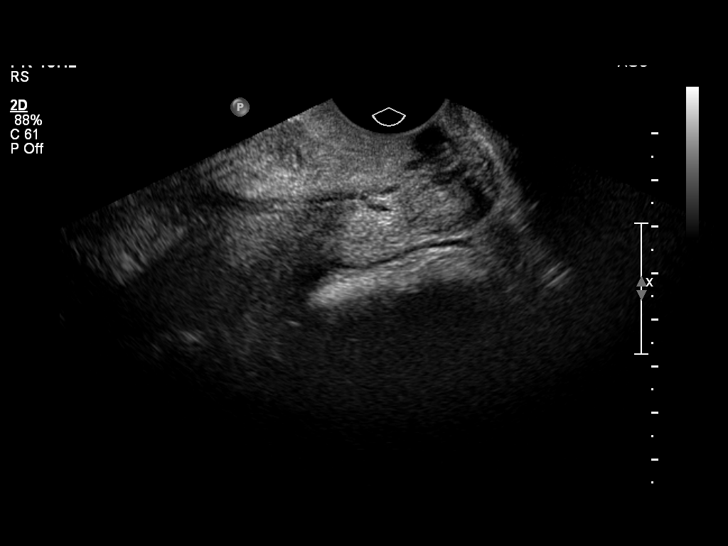

[14 of 28 positions shown; findings below may reference images not displayed]

IMPRESSION: See AS Obstetric US report.

## 2008-09-16 ENCOUNTER — Ambulatory Visit: Payer: Self-pay | Admitting: Obstetrics & Gynecology

## 2008-09-28 ENCOUNTER — Ambulatory Visit: Payer: Self-pay | Admitting: Nurse Practitioner

## 2008-10-29 ENCOUNTER — Emergency Department (HOSPITAL_COMMUNITY): Admission: EM | Admit: 2008-10-29 | Discharge: 2008-10-29 | Payer: Self-pay | Admitting: Emergency Medicine

## 2008-11-03 ENCOUNTER — Emergency Department: Payer: Self-pay | Admitting: Emergency Medicine

## 2008-11-08 ENCOUNTER — Emergency Department (HOSPITAL_COMMUNITY): Admission: EM | Admit: 2008-11-08 | Discharge: 2008-11-08 | Payer: Self-pay | Admitting: Emergency Medicine

## 2008-11-20 ENCOUNTER — Emergency Department: Payer: Self-pay | Admitting: Emergency Medicine

## 2009-01-10 ENCOUNTER — Ambulatory Visit: Payer: Self-pay | Admitting: Family Medicine

## 2009-01-10 LAB — CONVERTED CEMR LAB
Antibody Screen: NEGATIVE
Basophils Absolute: 0.1 10*3/uL (ref 0.0–0.1)
Basophils Relative: 0 % (ref 0–1)
Eosinophils Absolute: 0.1 10*3/uL (ref 0.0–0.7)
Eosinophils Relative: 1 % (ref 0–5)
HCT: 41.6 % (ref 36.0–46.0)
Hemoglobin: 13.5 g/dL (ref 12.0–15.0)
Hepatitis B Surface Ag: NEGATIVE
Lymphocytes Relative: 17 % (ref 12–46)
Lymphs Abs: 2.3 10*3/uL (ref 0.7–4.0)
MCHC: 32.5 g/dL (ref 30.0–36.0)
MCV: 93.7 fL (ref 78.0–100.0)
Monocytes Absolute: 0.9 10*3/uL (ref 0.1–1.0)
Monocytes Relative: 7 % (ref 3–12)
Neutro Abs: 9.8 10*3/uL — ABNORMAL HIGH (ref 1.7–7.7)
Neutrophils Relative %: 74 % (ref 43–77)
Platelets: 412 10*3/uL — ABNORMAL HIGH (ref 150–400)
RBC: 4.44 M/uL (ref 3.87–5.11)
RDW: 13.3 % (ref 11.5–15.5)
Rh Type: POSITIVE
Rubella: 120.1 intl units/mL — ABNORMAL HIGH
WBC: 13.2 10*3/uL — ABNORMAL HIGH (ref 4.0–10.5)

## 2009-01-14 ENCOUNTER — Ambulatory Visit (HOSPITAL_COMMUNITY): Admission: RE | Admit: 2009-01-14 | Discharge: 2009-01-14 | Payer: Self-pay | Admitting: Obstetrics & Gynecology

## 2009-01-14 IMAGING — US US OB TRANSVAGINAL
1 series · 14 of 28 positions shown · non-contrast
Comparison: none

OBSTETRICAL ULTRASOUND:
 This ultrasound exam was performed in the [HOSPITAL] Ultrasound Department.  The OB US report was generated in the AS system, and faxed to the ordering physician.  This report is also available in [REDACTED] PACS.

[Series 1: us ob transvaginal · 36 acquisitions, 14 frames shown]
[im 2/36]
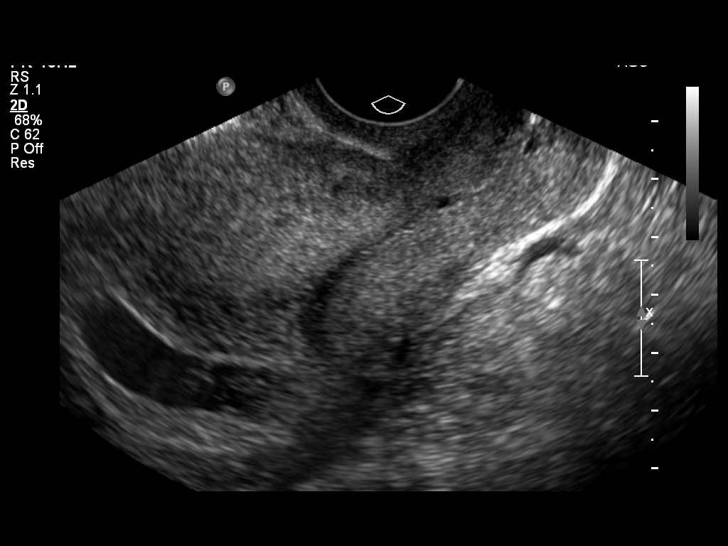
[im 4/36]
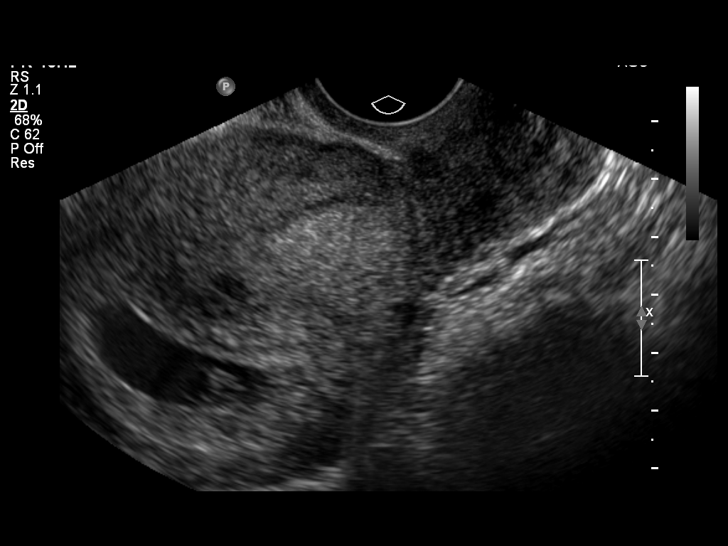
[im 7/36]
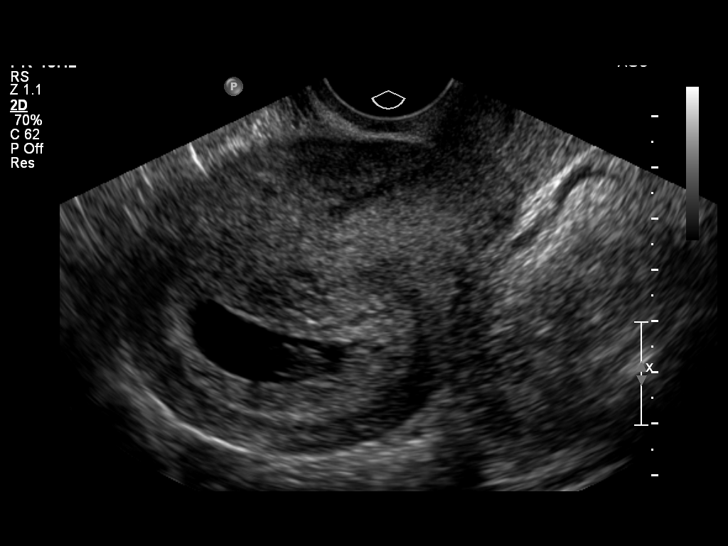
[im 10/36]
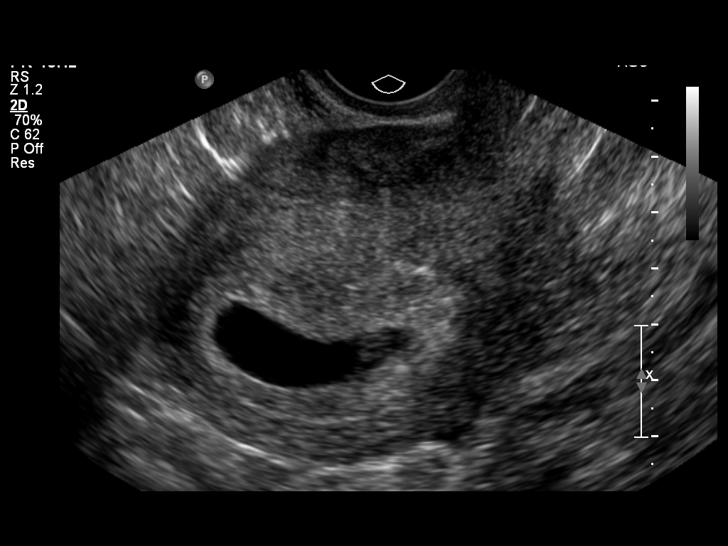
[im 12/36]
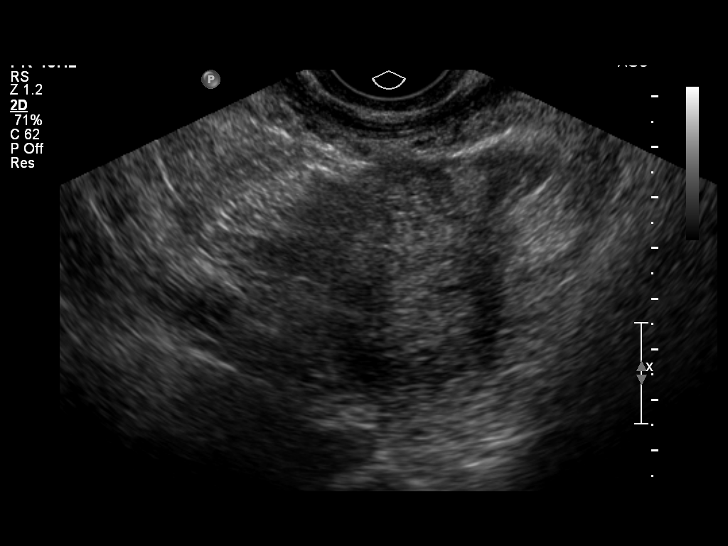
[im 15/36]
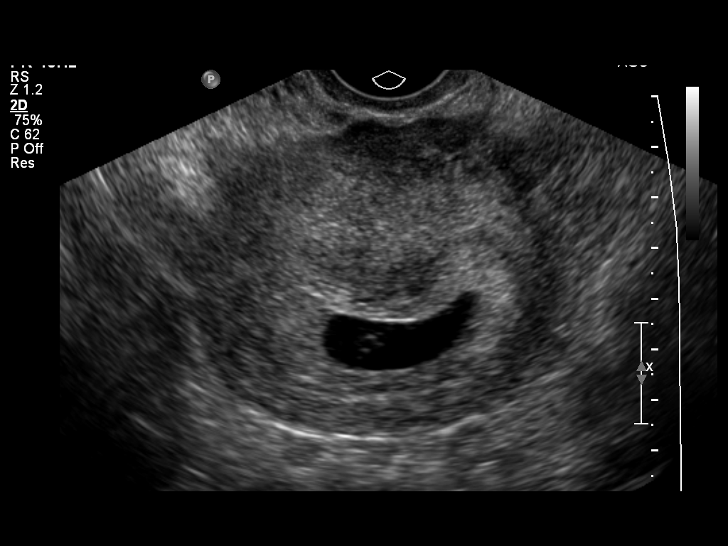
[im 17/36]
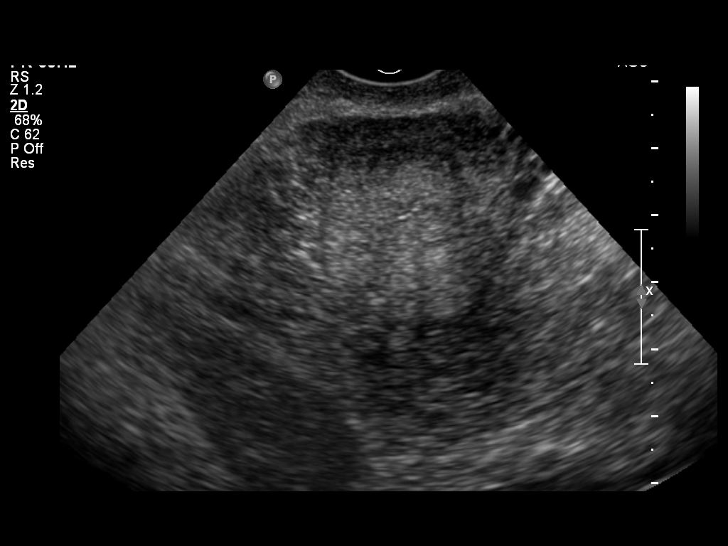
[im 20/36]
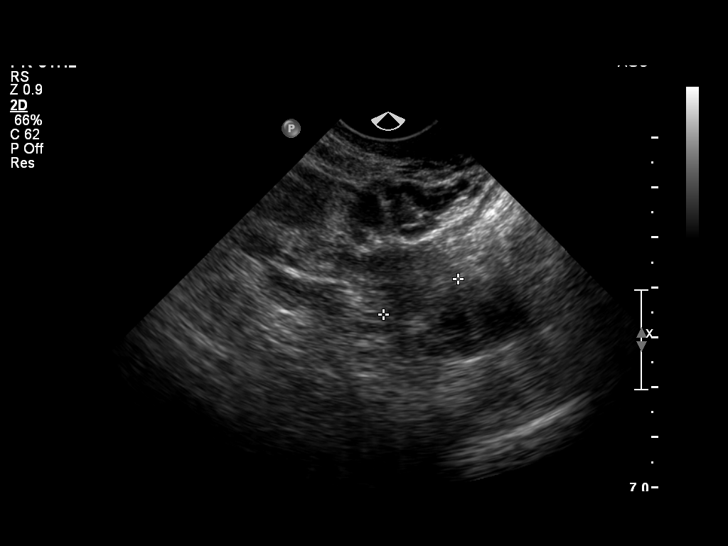
[im 23/36]
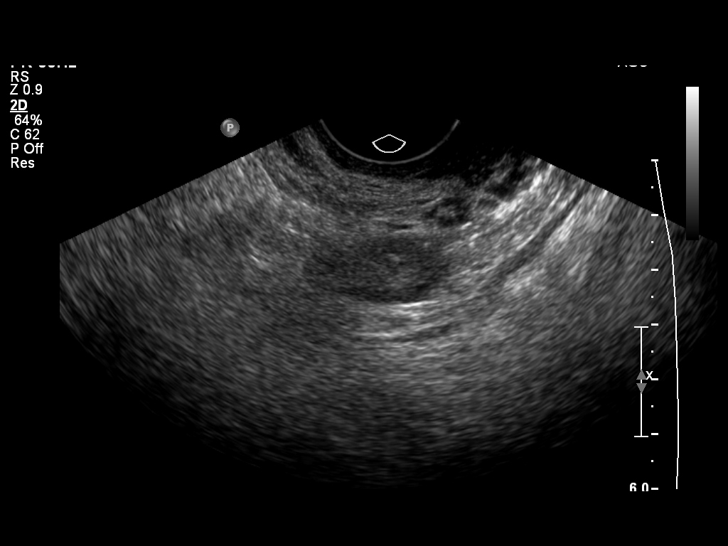
[im 25/36]
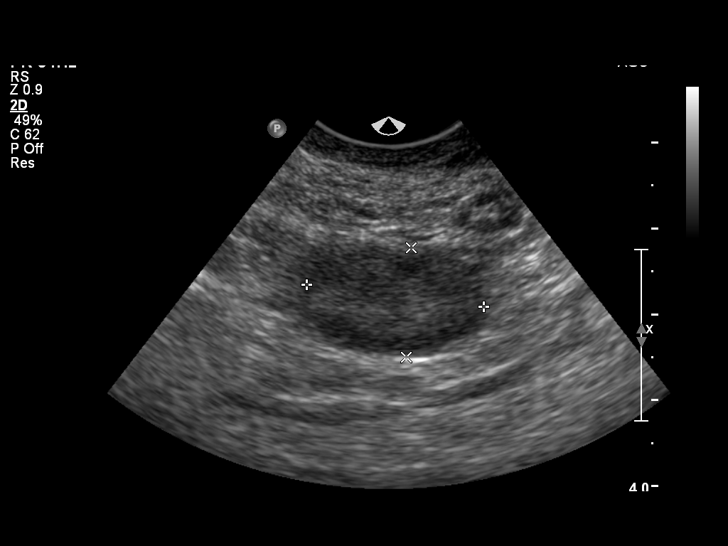
[im 28/36]
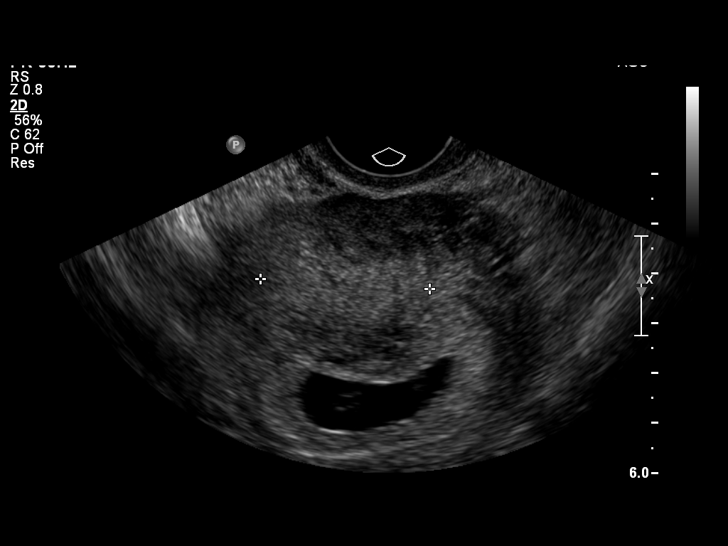
[im 30/36]
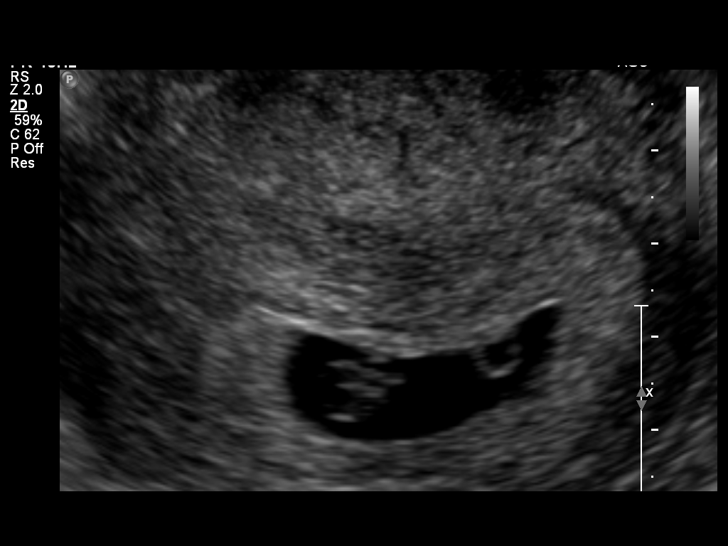
[im 33/36]
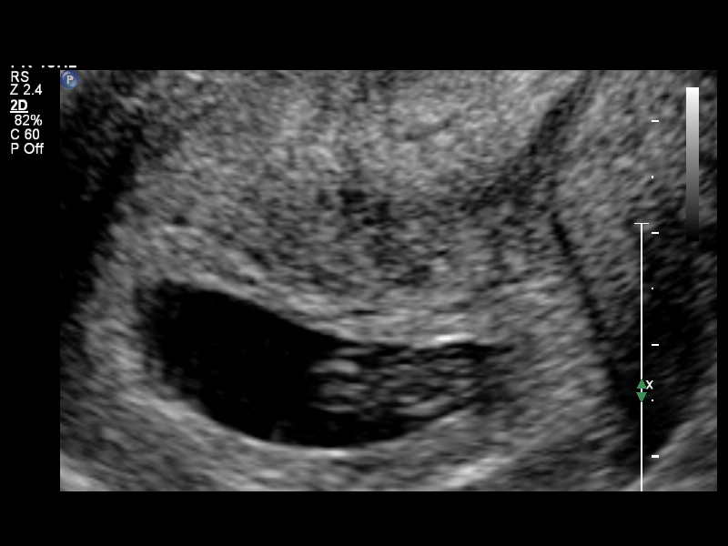
[im 36/36]
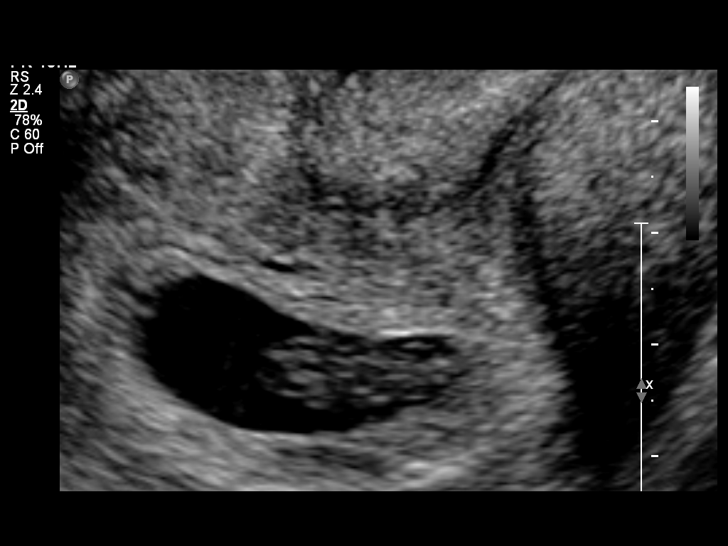

[14 of 28 positions shown; findings below may reference images not displayed]

IMPRESSION: See AS Obstetric US report.

## 2009-01-18 ENCOUNTER — Ambulatory Visit: Payer: Self-pay | Admitting: Obstetrics & Gynecology

## 2009-01-18 LAB — CONVERTED CEMR LAB
Chlamydia, DNA Probe: NEGATIVE
GC Probe Amp, Genital: NEGATIVE

## 2009-01-26 ENCOUNTER — Ambulatory Visit: Payer: Self-pay | Admitting: Obstetrics & Gynecology

## 2009-01-27 ENCOUNTER — Encounter: Payer: Self-pay | Admitting: Obstetrics & Gynecology

## 2009-02-10 ENCOUNTER — Ambulatory Visit: Payer: Self-pay | Admitting: Obstetrics & Gynecology

## 2009-03-07 ENCOUNTER — Ambulatory Visit: Payer: Self-pay | Admitting: Obstetrics and Gynecology

## 2009-03-15 ENCOUNTER — Ambulatory Visit: Payer: Self-pay | Admitting: Obstetrics & Gynecology

## 2009-03-21 ENCOUNTER — Ambulatory Visit (HOSPITAL_COMMUNITY): Admission: RE | Admit: 2009-03-21 | Discharge: 2009-03-21 | Payer: Self-pay | Admitting: Obstetrics & Gynecology

## 2009-03-21 IMAGING — US US OB DETAIL+14 WK
1 of 2 series · 14 of 28 positions shown · non-contrast
Comparison: none

OBSTETRICAL ULTRASOUND:
 This ultrasound exam was performed in the [HOSPITAL] Ultrasound Department.  The OB US report was generated in the AS system, and faxed to the ordering physician.  This report is also available in [HOSPITAL]?s [employer] and in [REDACTED] PACS.

[Series 1: us ob detail +14 wk · 14 of 61 slices shown]
[im 1/61]
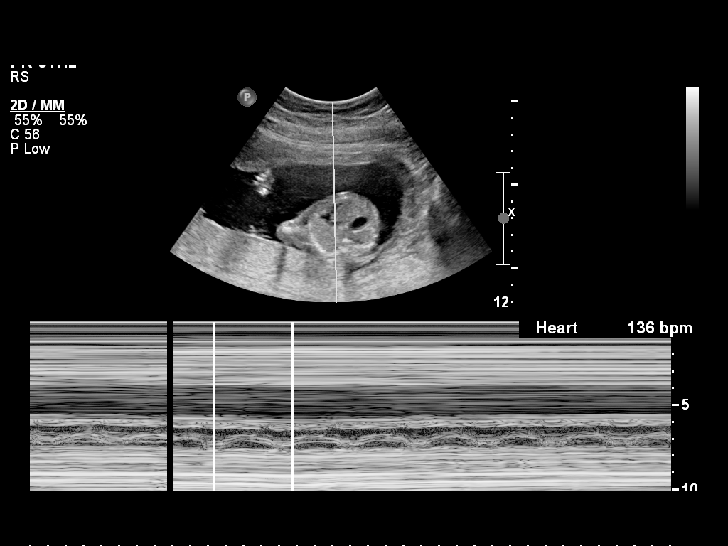
[im 5/61]
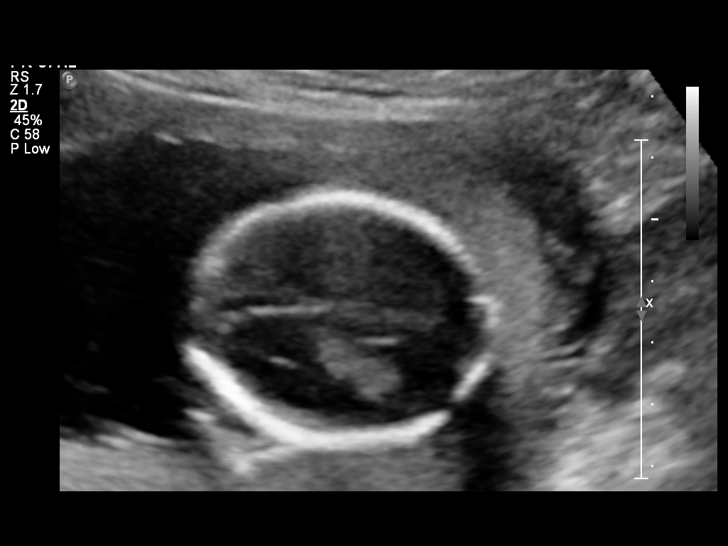
[im 10/61]
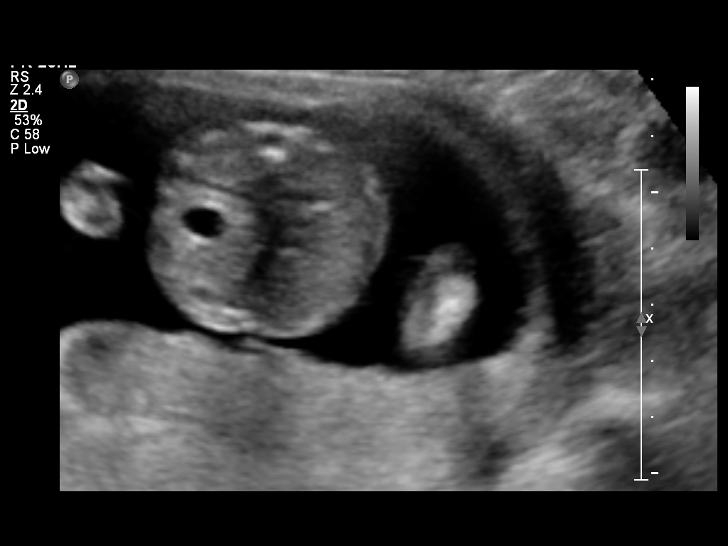
[im 14/61]
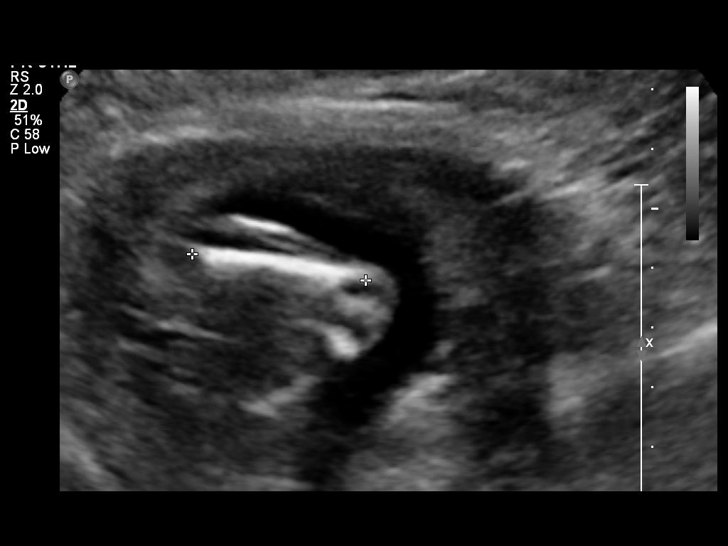
[im 19/61]
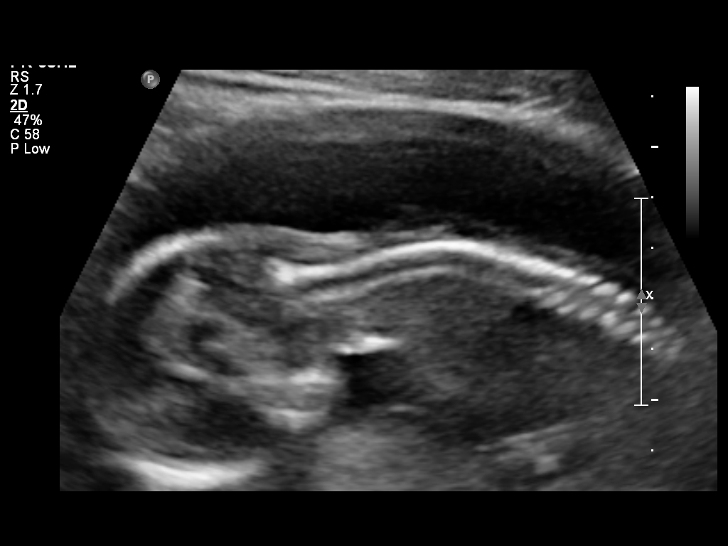
[im 24/61]
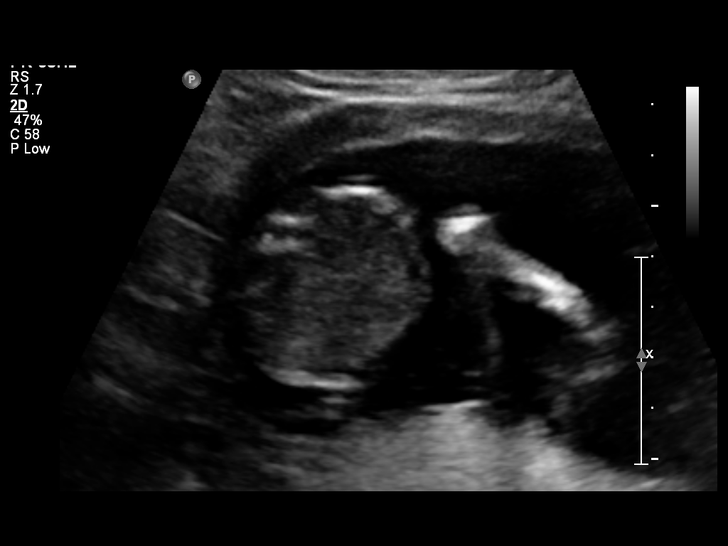
[im 28/61]
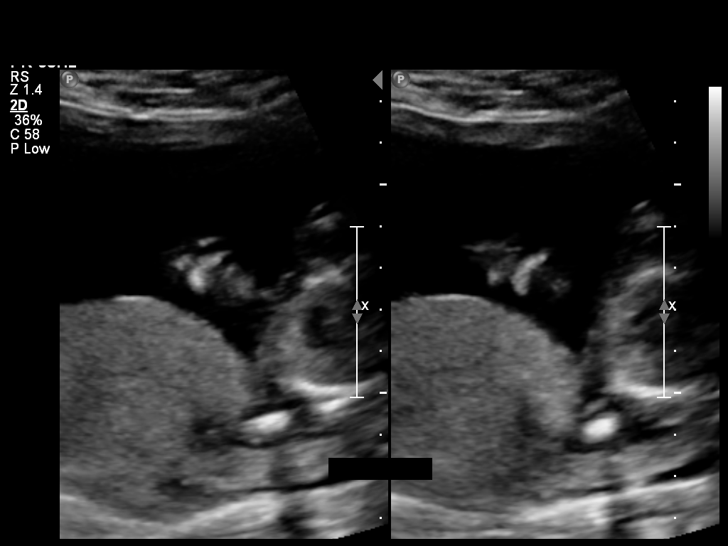
[im 33/61]
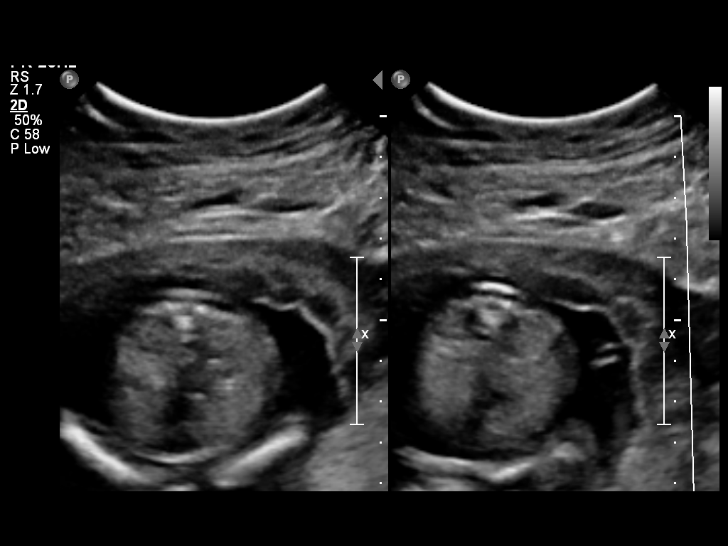
[im 37/61]
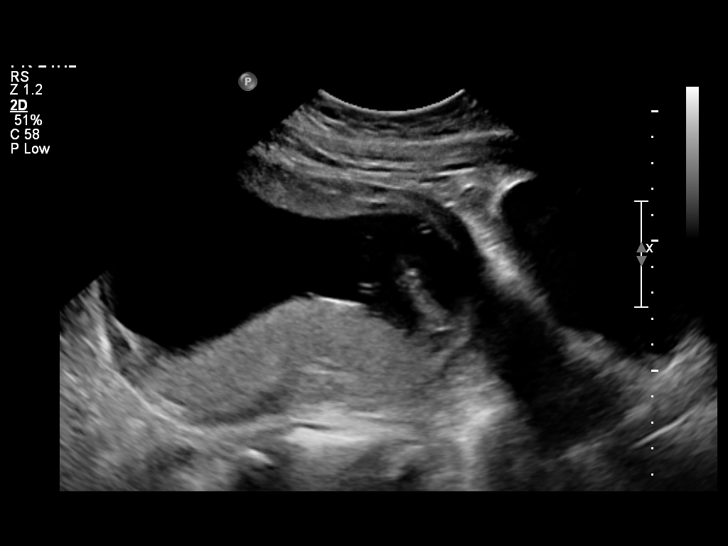
[im 42/61]
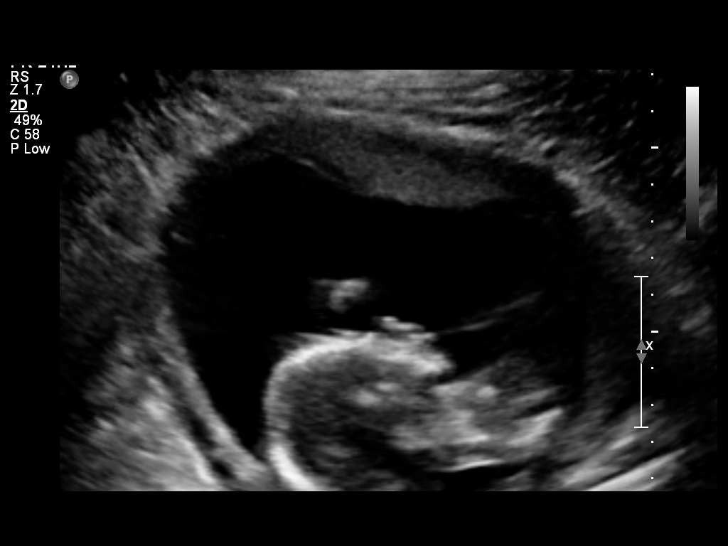
[im 47/61]
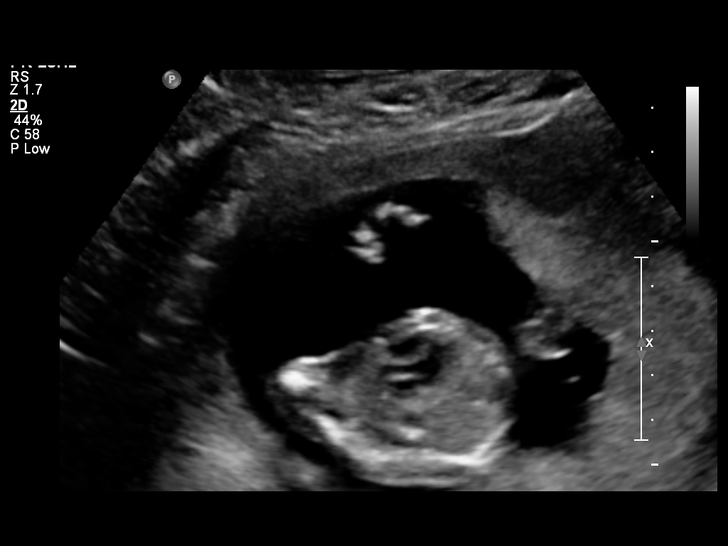
[im 51/61]
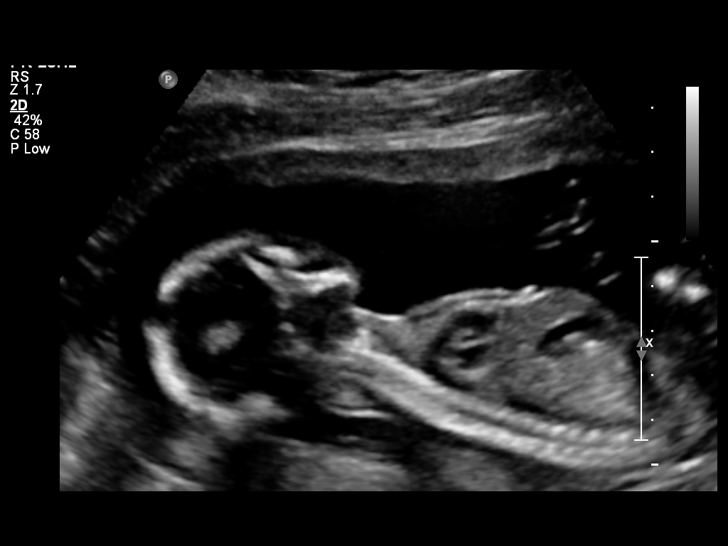
[im 56/61]
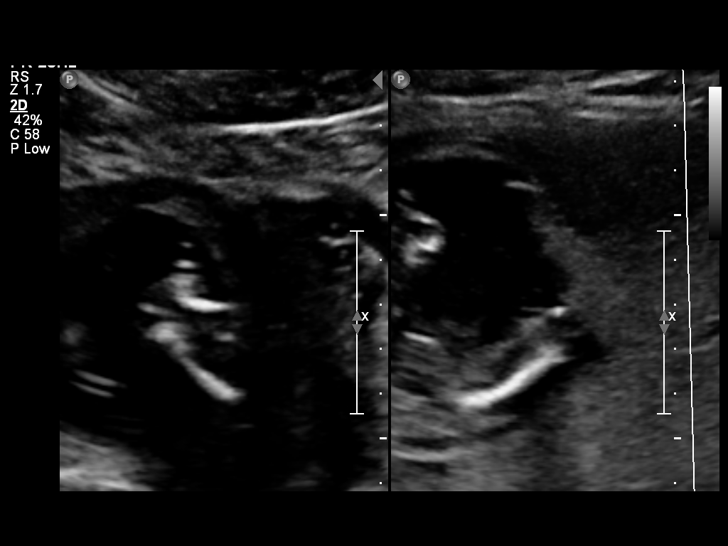
[im 61/61]
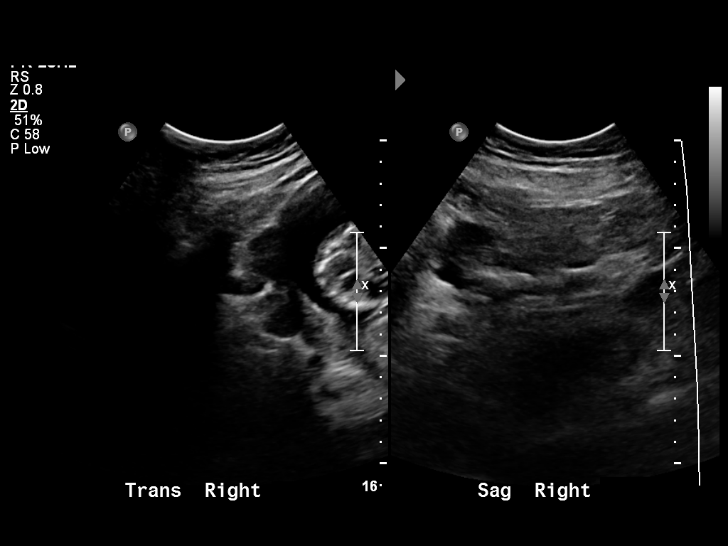

[14 of 28 positions shown; findings below may reference images not displayed]

IMPRESSION: See AS Obstetric US report.

## 2009-03-29 ENCOUNTER — Ambulatory Visit: Payer: Self-pay | Admitting: Family Medicine

## 2009-04-04 ENCOUNTER — Ambulatory Visit: Payer: Self-pay | Admitting: Obstetrics and Gynecology

## 2009-04-12 ENCOUNTER — Ambulatory Visit: Payer: Self-pay | Admitting: Obstetrics and Gynecology

## 2009-04-27 ENCOUNTER — Ambulatory Visit: Payer: Self-pay | Admitting: Obstetrics and Gynecology

## 2009-06-06 ENCOUNTER — Ambulatory Visit: Payer: Self-pay | Admitting: Obstetrics and Gynecology

## 2009-06-06 LAB — CONVERTED CEMR LAB
Amphetamine Screen, Ur: NEGATIVE
Barbiturate Quant, Ur: NEGATIVE
Benzodiazepines.: NEGATIVE
Cocaine Metabolites: POSITIVE — AB
Creatinine,U: 266.1 mg/dL
Marijuana Metabolite: NEGATIVE
Methadone: NEGATIVE
Opiate Screen, Urine: NEGATIVE
Phencyclidine (PCP): NEGATIVE
Propoxyphene: NEGATIVE

## 2009-06-27 ENCOUNTER — Ambulatory Visit: Payer: Self-pay | Admitting: Obstetrics and Gynecology

## 2009-06-28 ENCOUNTER — Encounter: Payer: Self-pay | Admitting: Obstetrics and Gynecology

## 2009-06-28 LAB — CONVERTED CEMR LAB
Amphetamine Screen, Ur: NEGATIVE
Barbiturate Quant, Ur: NEGATIVE
Benzodiazepines.: POSITIVE — AB
Cocaine Metabolites: NEGATIVE
Creatinine,U: 254.1 mg/dL
Marijuana Metabolite: NEGATIVE
Methadone: NEGATIVE
Opiate Screen, Urine: NEGATIVE
Phencyclidine (PCP): NEGATIVE
Propoxyphene: NEGATIVE

## 2009-08-11 ENCOUNTER — Ambulatory Visit: Payer: Self-pay | Admitting: Obstetrics & Gynecology

## 2009-08-11 LAB — CONVERTED CEMR LAB
Amphetamine Screen, Ur: NEGATIVE
Barbiturate Quant, Ur: NEGATIVE
Benzodiazepines.: NEGATIVE
Cocaine Metabolites: POSITIVE — AB
Creatinine,U: 353.3 mg/dL
Marijuana Metabolite: NEGATIVE
Methadone: NEGATIVE

## 2009-08-12 ENCOUNTER — Ambulatory Visit (HOSPITAL_COMMUNITY): Admission: RE | Admit: 2009-08-12 | Discharge: 2009-08-12 | Payer: Self-pay | Admitting: Family Medicine

## 2009-08-12 ENCOUNTER — Encounter: Payer: Self-pay | Admitting: Obstetrics & Gynecology

## 2009-08-12 LAB — CONVERTED CEMR LAB
Chlamydia, DNA Probe: NEGATIVE
GC Probe Amp, Genital: NEGATIVE

## 2009-08-12 IMAGING — US US OB FOLLOW-UP
1 series · 14 of 25 positions shown · non-contrast
Comparison: none

OBSTETRICAL ULTRASOUND:
 This ultrasound exam was performed in the [HOSPITAL] Ultrasound Department.  The OB US report was generated in the AS system, and faxed to the ordering physician.  This report is also available in [HOSPITAL]?s [employer] and in [REDACTED] PACS.

[Series 1: us ob follow up · 0.30mm/px · 14 of 25 slices shown]
[im 1/25]
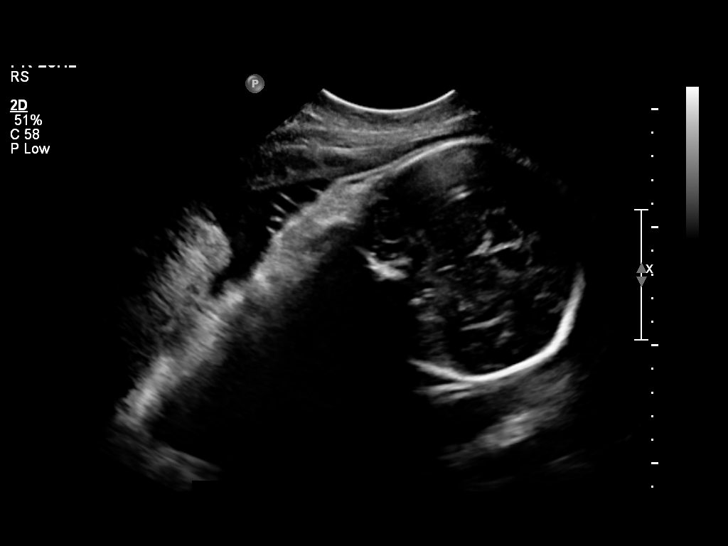
[im 3/25]
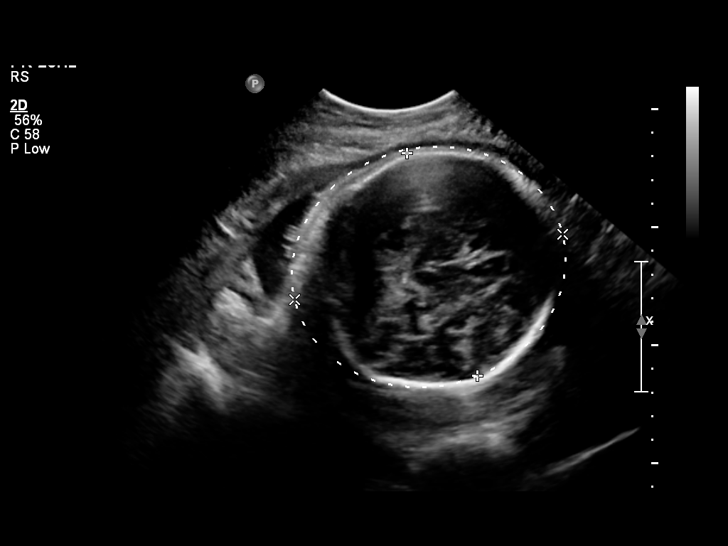
[im 5/25]
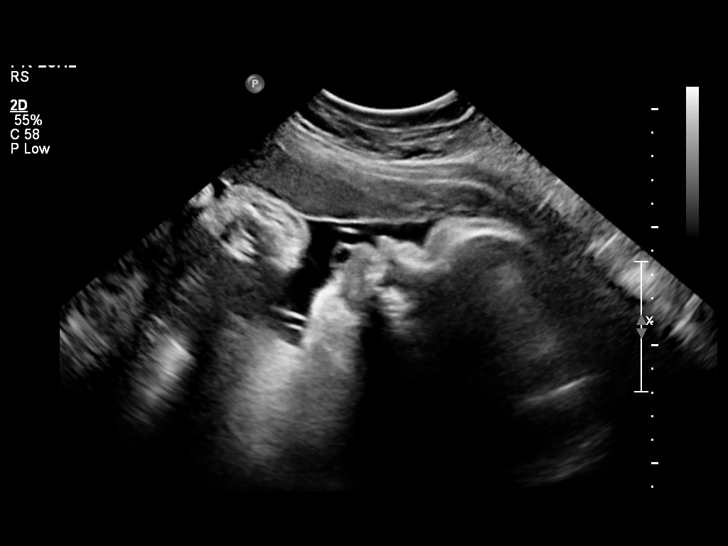
[im 7/25]
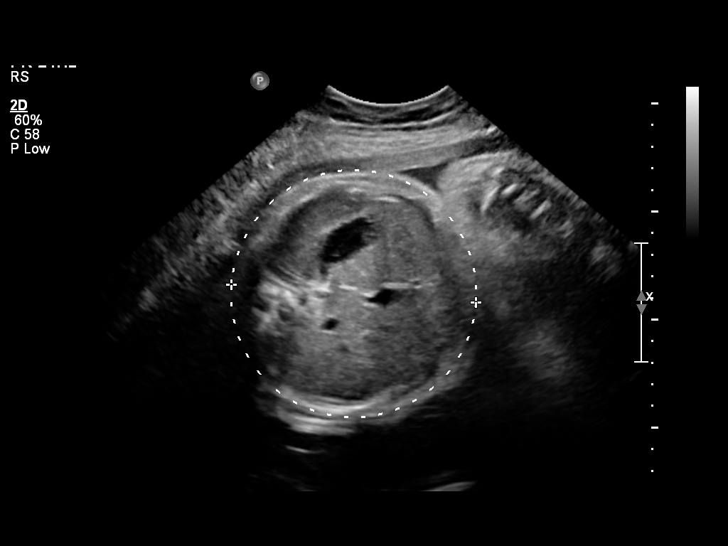
[im 9/25]
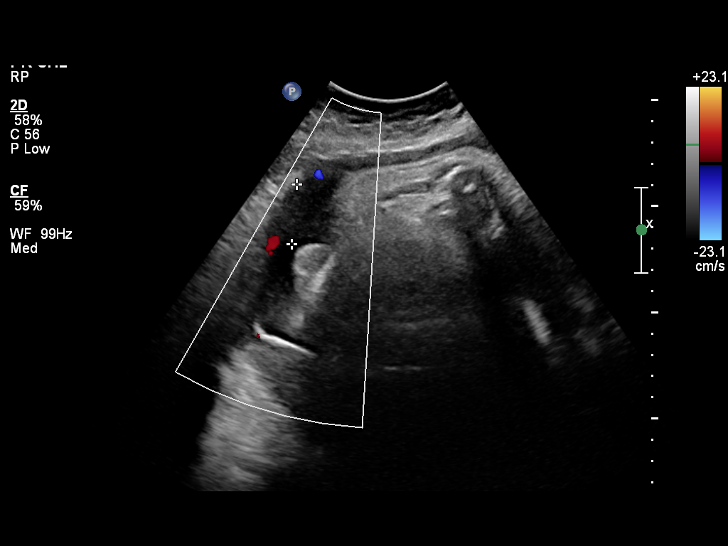
[im 10/25]
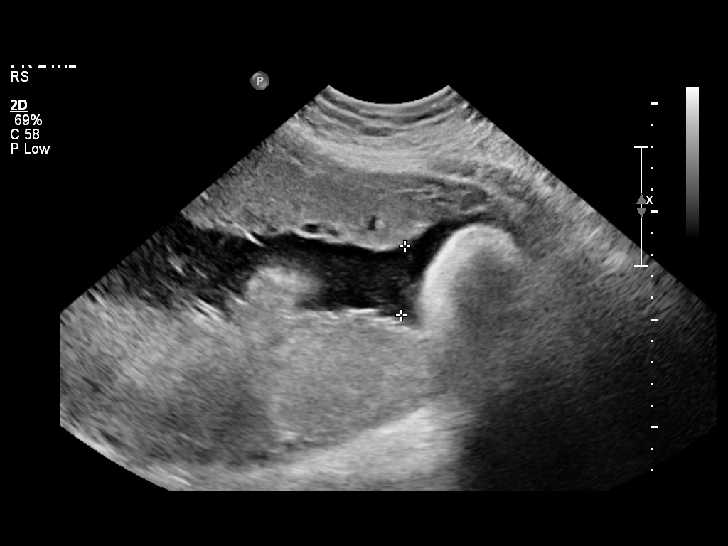
[im 12/25]
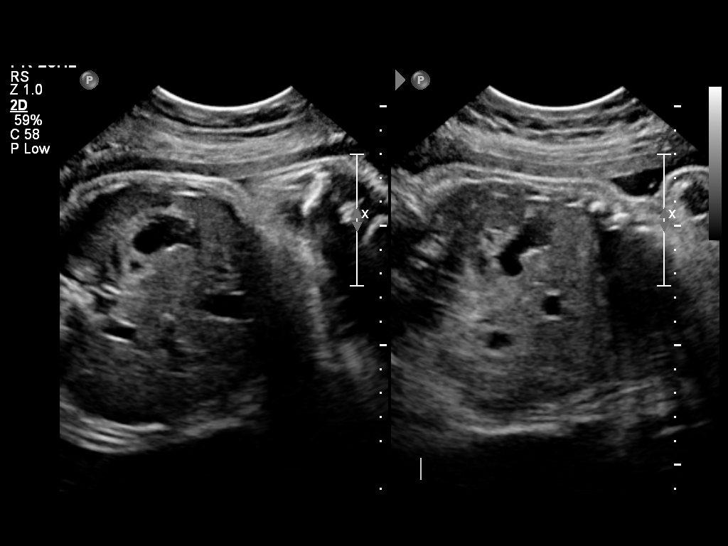
[im 14/25]
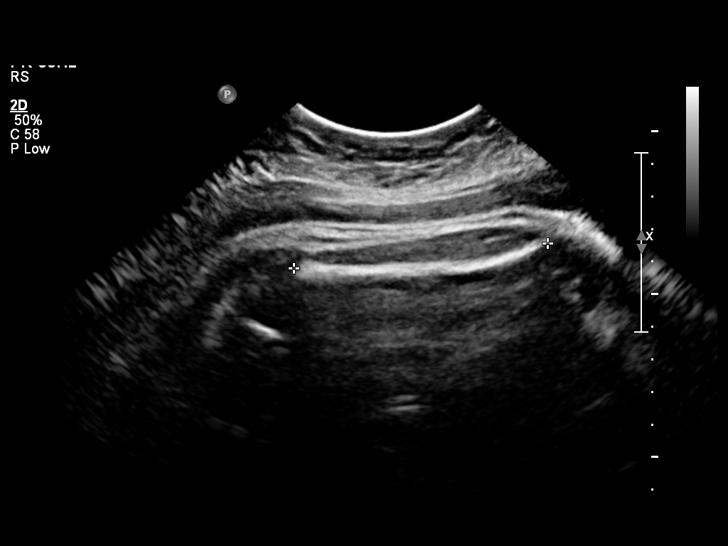
[im 16/25]
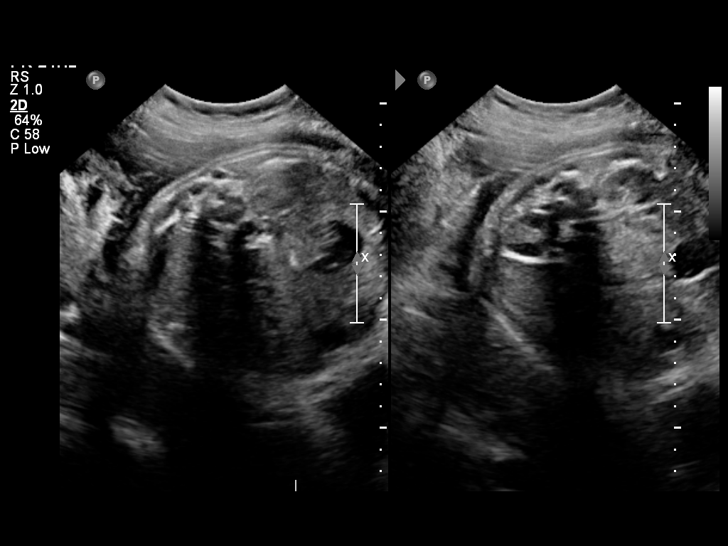
[im 17/25]
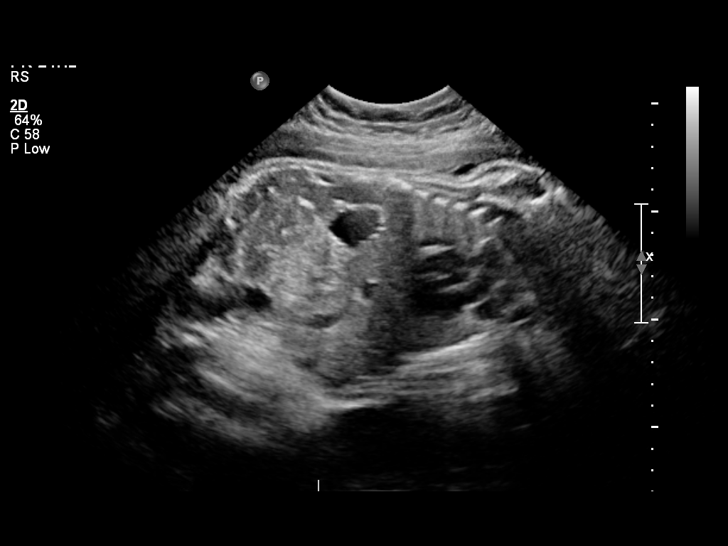
[im 19/25]
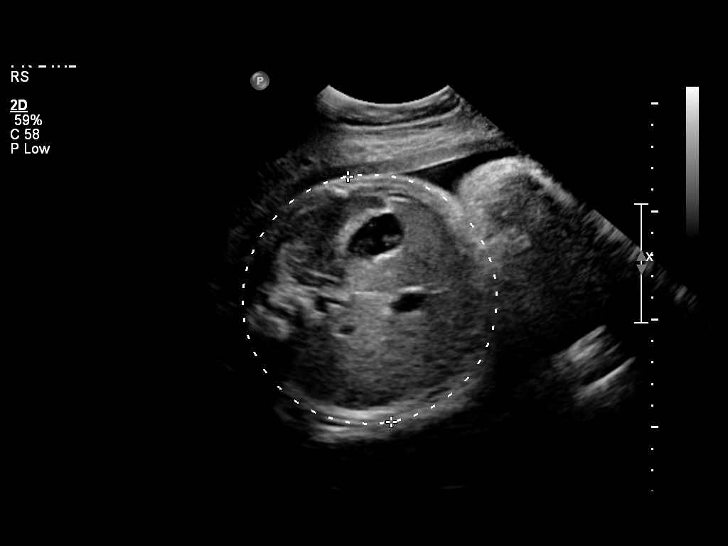
[im 21/25]
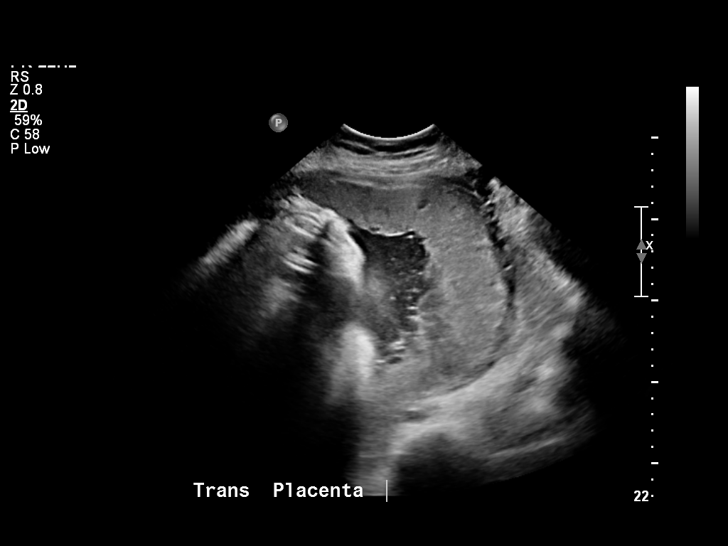
[im 23/25]
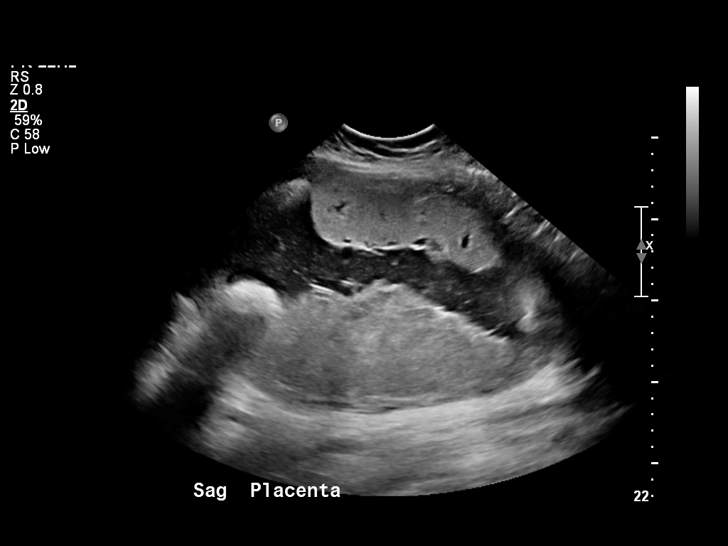
[im 25/25]
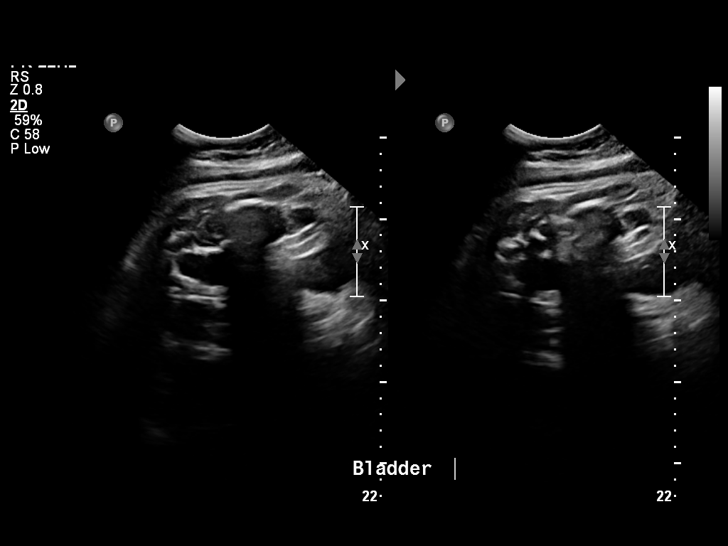

[14 of 25 positions shown; findings below may reference images not displayed]

IMPRESSION: See AS Obstetric US report.

## 2009-08-14 ENCOUNTER — Inpatient Hospital Stay (HOSPITAL_COMMUNITY): Admission: AD | Admit: 2009-08-14 | Discharge: 2009-08-17 | Payer: Self-pay | Admitting: Obstetrics and Gynecology

## 2009-08-14 ENCOUNTER — Ambulatory Visit: Payer: Self-pay | Admitting: Family Medicine

## 2009-08-15 ENCOUNTER — Encounter: Payer: Self-pay | Admitting: Obstetrics and Gynecology

## 2009-08-25 ENCOUNTER — Ambulatory Visit: Payer: Self-pay | Admitting: Obstetrics & Gynecology

## 2009-09-06 ENCOUNTER — Emergency Department: Payer: Self-pay | Admitting: Emergency Medicine

## 2009-09-06 ENCOUNTER — Emergency Department (HOSPITAL_COMMUNITY): Admission: EM | Admit: 2009-09-06 | Discharge: 2009-09-06 | Payer: Self-pay | Admitting: Emergency Medicine

## 2009-09-07 ENCOUNTER — Emergency Department: Payer: Self-pay | Admitting: Emergency Medicine

## 2009-09-21 ENCOUNTER — Ambulatory Visit: Payer: Self-pay | Admitting: Family Medicine

## 2009-09-22 ENCOUNTER — Emergency Department: Payer: Self-pay | Admitting: Emergency Medicine

## 2009-09-22 IMAGING — CR RIGHT ANKLE - 2 VIEW
1 series · 2 of 2 positions shown · non-contrast
Comparison: none

REASON FOR EXAM: twisted  in [place]
COMMENTS:   LMP: Post Partum

[Series 1: view not recorded · 0.17mm/px · 2 of 2 slices shown]
[im 1/2]
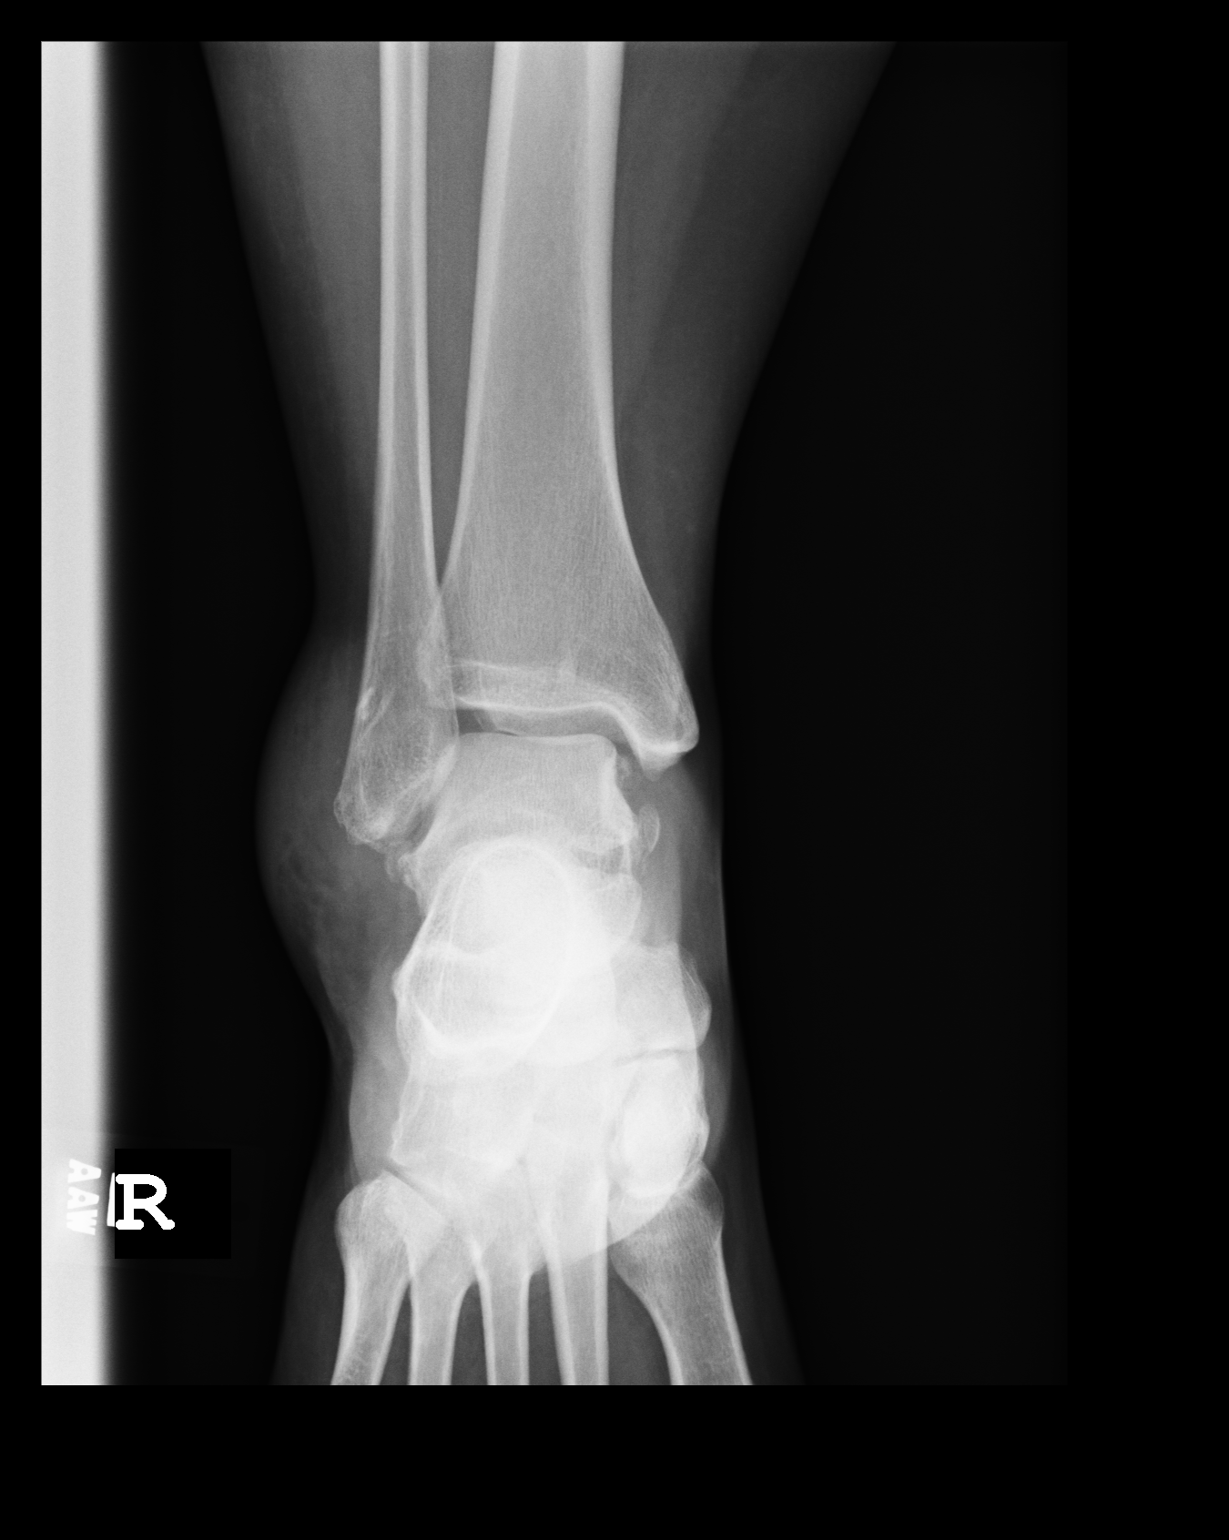
[im 2/2]
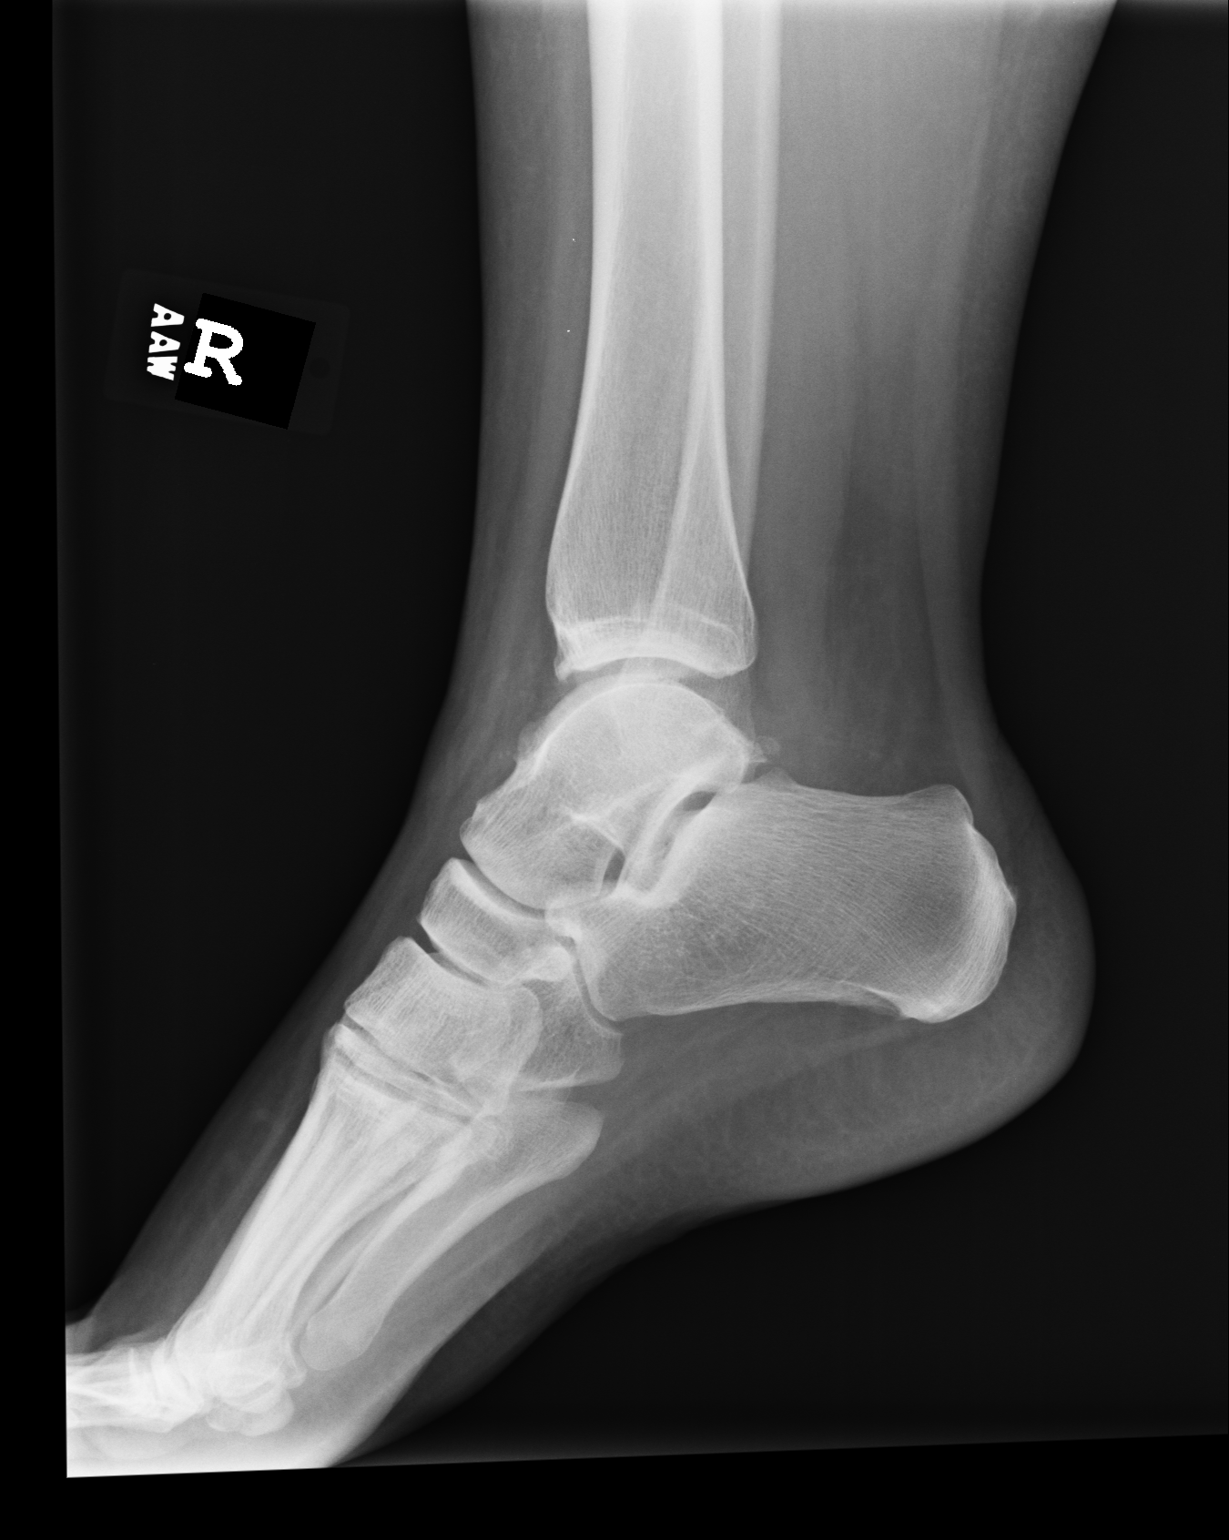

[2 of 2 positions shown; findings below may reference images not displayed]

PROCEDURE:     DXR - DXR ANKLE RIGHT AP AND LATERAL  - [DATE]  [DATE]

RESULT:     Images of the right ankle are compared to the study of [DATE].

Degenerative changes are seen. Accessory ossicles or old avulsions are seen
medially and laterally. No acute bony abnormality is appreciated but there
is significant soft tissue swelling laterally. Some sclerosis is present in
the distal portion of the fibula and an incomplete or nondisplaced fracture
is not completely excluded. The appearance, however, is not significantly
different from that of [TB].
IMPRESSION: 1. Chronic changes.

2. Significant soft tissue swelling. Close clinical follow-up is recommended.

## 2009-09-23 ENCOUNTER — Ambulatory Visit (HOSPITAL_COMMUNITY): Admission: RE | Admit: 2009-09-23 | Discharge: 2009-09-23 | Payer: Self-pay | Admitting: Obstetrics & Gynecology

## 2009-09-23 ENCOUNTER — Ambulatory Visit: Payer: Self-pay | Admitting: Family Medicine

## 2009-12-06 ENCOUNTER — Emergency Department: Payer: Self-pay | Admitting: Emergency Medicine

## 2010-08-01 LAB — RAPID URINE DRUG SCREEN, HOSP PERFORMED
Barbiturates: NOT DETECTED
Benzodiazepines: POSITIVE — AB
Cocaine: POSITIVE — AB
Opiates: NOT DETECTED

## 2010-08-01 LAB — CBC
MCV: 92.4 fL (ref 78.0–100.0)
RBC: 4.35 MIL/uL (ref 3.87–5.11)
WBC: 12.1 10*3/uL — ABNORMAL HIGH (ref 4.0–10.5)

## 2010-08-01 LAB — PREGNANCY, URINE: Preg Test, Ur: NEGATIVE

## 2010-08-02 LAB — CBC
MCHC: 33.4 g/dL (ref 30.0–36.0)
MCV: 92.3 fL (ref 78.0–100.0)
Platelets: 313 10*3/uL (ref 150–400)
RDW: 14.2 % (ref 11.5–15.5)
WBC: 14 10*3/uL — ABNORMAL HIGH (ref 4.0–10.5)

## 2010-08-02 LAB — RPR: RPR Ser Ql: NONREACTIVE

## 2010-08-02 LAB — RAPID URINE DRUG SCREEN, HOSP PERFORMED: Tetrahydrocannabinol: NOT DETECTED

## 2010-08-21 LAB — RAPID STREP SCREEN (MED CTR MEBANE ONLY): Streptococcus, Group A Screen (Direct): NEGATIVE

## 2010-08-24 LAB — POCT PREGNANCY, URINE: Preg Test, Ur: NEGATIVE

## 2010-08-28 LAB — URINALYSIS, ROUTINE W REFLEX MICROSCOPIC
Nitrite: POSITIVE — AB
Protein, ur: NEGATIVE mg/dL
Specific Gravity, Urine: 1.023 (ref 1.005–1.030)
Urobilinogen, UA: 0.2 mg/dL (ref 0.0–1.0)

## 2010-08-28 LAB — BASIC METABOLIC PANEL
BUN: 14 mg/dL (ref 6–23)
Calcium: 9.1 mg/dL (ref 8.4–10.5)
GFR calc non Af Amer: >60 mL/min (ref >60)
Glucose, Bld: 100 mg/dL — ABNORMAL HIGH (ref 70–99)
Potassium: 3.7 mEq/L (ref 3.5–5.1)
Sodium: 138 mEq/L (ref 135–145)

## 2010-08-28 LAB — URINE CULTURE

## 2010-08-28 LAB — CBC
HCT: 40.3 % (ref 36.0–46.0)
Platelets: 377 10*3/uL (ref 150–400)
RDW: 14.5 % (ref 11.5–15.5)
WBC: 14.8 10*3/uL — ABNORMAL HIGH (ref 4.0–10.5)

## 2010-08-28 LAB — URINE MICROSCOPIC-ADD ON

## 2010-08-29 LAB — CBC
HCT: 41.8 % (ref 36.0–46.0)
Platelets: 364 10*3/uL (ref 150–400)
RDW: 14.5 % (ref 11.5–15.5)
WBC: 16.3 10*3/uL — ABNORMAL HIGH (ref 4.0–10.5)

## 2010-08-29 LAB — URINALYSIS, ROUTINE W REFLEX MICROSCOPIC
Leukocytes, UA: NEGATIVE
Nitrite: POSITIVE — AB
Protein, ur: NEGATIVE mg/dL
Specific Gravity, Urine: 1.022 (ref 1.005–1.030)
Urobilinogen, UA: 1 mg/dL (ref 0.0–1.0)

## 2010-08-29 LAB — BASIC METABOLIC PANEL
BUN: 14 mg/dL (ref 6–23)
Calcium: 9.2 mg/dL (ref 8.4–10.5)
Creatinine, Ser: 0.98 mg/dL (ref 0.4–1.2)
GFR calc non Af Amer: >60 mL/min (ref >60)
Glucose, Bld: 107 mg/dL — ABNORMAL HIGH (ref 70–99)

## 2010-08-29 LAB — URINE MICROSCOPIC-ADD ON

## 2010-09-02 ENCOUNTER — Emergency Department: Payer: Self-pay | Admitting: Emergency Medicine

## 2010-09-26 NOTE — Assessment & Plan Note (Signed)
NAMEHARLEM, THRESHER                ACCOUNT NO.:  [id number]   MEDICAL RECORD NO.:  [id number]          PATIENT TYPE:  POB   LOCATION:  [facility]         FACILITY:  [facility]   PHYSICIAN:  Allie Bossier, MD        DATE OF BIRTH:  1970/06/07   DATE OF SERVICE:  09/16/2008                                  CLINIC NOTE   Tzivia is a 40 year old G3, P2, now A1.  She has not had any bleeding or  cramping, but on her quantitative beta-hCG done September 09, 2008, her  Sharene Butters was 75 and on a repeat quant done on Sep 15, 2008, it was half at  4130.  She has no particular complaints tonight today.  Please note an  ultrasound was done on Sep 15, 2008 (yesterday) that showed an irregular  gestational sac.  No embryo-yolk sac  correlation with prior exam is  recommended.  In light of her following hCG and abnormal ultrasound  findings, the diagnosis of missed AB has been made.  Her blood type is  O+.  She is not interested in watchful waiting.  I have offered her  Cytotec and she agrees to this and she prefers up this method.  I gave  her a prescription Cytotec 800 mcg to be used tonight and a prescription  for ibuprofen 800 mg to be used every 8 hours as necessary along with  her prescription for Percocet #20 no refills to be used as necessary.  She will follow up in 2 weeks.  I counseled her that her bleeding should  began in the next 24 hours and it will be like a heavy painful periods.      Allie Bossier, MD     MCD/MEDQ  D:  09/16/2008  T:  09/17/2008  Job:  [id number]

## 2010-09-26 NOTE — Assessment & Plan Note (Signed)
NAME:  [name], [name]                ACCOUNT NO.:  [id number]   MEDICAL RECORD NO.:  [id number]          PATIENT TYPE:  POB   LOCATION:  [facility]         FACILITY:  [facility]   PHYSICIAN:  Tinnie Gens, MD        DATE OF BIRTH:  Sep 14, 1970   DATE OF SERVICE:  09/21/2009                                  CLINIC NOTE   CHIEF COMPLAINT:  Reschedule tubal ligation, depression and anxiety.   HISTORY OF PRESENT ILLNESS:  The patient is a 40 year old, gravida 4,  para 3-0-1-3, who was supposed to have an interval tubal ligation on  Monday, Sep 19, 2009; however, she failed to keep her preop appointment  and so her surgery was cancelled.  She would like to be rescheduled as  quickly as possible.  At this time, the patient has multiple  psychosocial stressors including she has 2 kids involved in the court  system and CPS is investigating her.  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  She was started on Wellbutrin  by Dr. Debroah Loop in April 2011.  She went to somewhere in [place], they  told her to go to [facility] where she went.  They  recommended admission but when she went to the hospital, they would not  admit her secondary to lack of homicidal or suicidal ideation.  She is  very tearful in the office today complaining Klonopin is not helping  her.  She has taken her Wellbutrin dutifully.  She says she is not using  any other drugs and she wants something stronger for her nerves.  Her 71-  year-old son apparently is accused of raping her 67-year-old niece and  she is very upset about this and the court date supposedly is coming.  Again, a very lengthy conversation was held with this patient about  medications, addictive potentials, about the fact there were no  psychiatrists here.  She really needs to see a psychiatrist by getting  back to the [facility].  The patient is unwilling to do  that at this time.  We additionally  discussed her ability to find  further assistance.  She has pregnancy Medicaid.  She presumed she will  get regular Medicaid in approximately 2-3 weeks.  She is encouraged to  keep these appointments in order to follow up tubal and her tubal will  be rescheduled for Sep 23, 2009, at 10:30.           ______________________________  Tinnie Gens, MD     TP/MEDQ  D:  09/21/2009  T:  09/22/2009  Job:  [id number]

## 2010-09-26 NOTE — Assessment & Plan Note (Signed)
[name], [name]                ACCOUNT NO.:  [id number]   MEDICAL RECORD NO.:  [id number]          PATIENT TYPE:  POB   LOCATION:  [facility] at [facility]         FACILITY:  [facility]   PHYSICIAN:  Tinnie Gens, MD        DATE OF BIRTH:  07/03/1970   DATE OF SERVICE:  09/28/2008                                  CLINIC NOTE   Patient comes to office today for headache consultation.  Patient has  had migraine without aura since she was approximately in her 30s.  She  does feel in the last few years they have gotten worse.  She did have a  spontaneous miscarriage approximately 2 weeks ago.  Since then, she has  had a significant headache.  She went to [facility];  they gave her antibiotics for a sinus infection that were of no help.  She then went to the ER at [facility] and that was no help either.  She  has in the past had an MRI and a CT which were negative.  She has some  difficulties because she does not have any health insurance and she has  had sporadic health care.  She has seen Dr. Vear Clock, primary care  doctor, as well as Patric Dykes in [place] in the past.  She has been  given mostly pain medicines including Vicodin, Percocet, and Ultram.  Her pain now is in her left temple.  It can occasionally move to her  right side but predominantly is in the right temple.  Her pain is now  also in the right occipital and right neck and shoulder region.  With a  severe headache, she can have photophobia and nausea, occasionally  vomiting.  In a 40-month period, she may have 4 severe, 4 to 5 moderate,  and may have an almost daily mild headache.  The patient also smokes  usually 1 package of cigarettes a day but since her miscarriage she has  been smoking 2 packs a day.  She also has severe allergies and has  worsening of her headaches with the weather.  She has been taking over-  the-counter medications for this only.  She has been seen in this office  and on Sep 16, 2008, she  was given 20 Percocet.  On Sep 20, 2008, she was  given 20 Vicodin.  Socially, the patient is engaged to be married.  She  has 2 children.  She does not work.  She lost her job, was on  unemployment.  She again does not have health insurance.  For birth  control, she has not resumed sexual activity since the miscarriage and  has not been using birth control for the past 1 year.   VITAL SIGNS:  Blood pressure is 132/89.  Pulse is 108.  Weight is 244.  Height is 5 feet 10.   ALLERGIES:  NO KNOWN DRUG ALLERGIES.   MENSTRUAL PERIOD:  Patient has not had her menstrual cycle.   PHYSICAL EXAM:  Well-developed, well-nourished, obese, 40 year old,  smoking, Caucasian female.  HEENT:  Head is normocephalic and atraumatic.  Patient is slightly  photophobic today.  She has significant tenderness in  her left temple,  her right occipital, neck, and traps.  CARDIAC:  Regular rate and rhythm.  She is slightly tachycardiac.  LUNGS:  Clear bilaterally.  EXTREMITIES:  Warm and dry.   PROCEDURES:  Trigger point injections done today.  Please see trigger  point injection sheet.   ASSESSMENT:  1. Migraine without aura.  2. Smoker.  3. Obesity.  4. Status post spontaneous miscarriage.  5. Questionable depression.   PLAN:  1. We had a lengthy discussion today concerning migraine mechanisms      and we did do significant education.  She was also encouraged to      stop smoking or at least reduce her cigarette intake and try more      healthy lifestyle habits.  2. We did trigger point injections today and she had immediate relief      and felt very good about that.  3. She will start on Effexor 75 mg XR one p.o. q.a.m., #30 with 3      refills.  She was also given prescription for Imitrex 100 mg one      p.o. p.r.n. migraine. #9 with p.r.n. refills.  4. She was given a prescription for muscle spasm Flexeril 10 mg one      p.o. every 8 hours, #60 with 2 refills.  Patient was also given       Biofreeze samples to help with her shoulder.  She is asked to heat      and ice tonight.  She will return to the clinic in 1 week.  She      will call sooner if she has any problems.      Remonia Richter, NP    ______________________________  Tinnie Gens, MD    LR/MEDQ  D:  09/28/2008  T:  09/28/2008  Job:  [id number]

## 2010-09-26 NOTE — Assessment & Plan Note (Signed)
NAME:  ROBERT, SUNGA                ACCOUNT NO.:  [id number]   MEDICAL RECORD NO.:  [id number]          PATIENT TYPE:  POB   LOCATION:  [facility] at [facility]         FACILITY:  [facility]   PHYSICIAN:  Scheryl Darter, MD       DATE OF BIRTH:  23-Nov-1970   DATE OF SERVICE:                                  CLINIC NOTE   The patient is postpartum for vaginal delivery on April 30.  She is  scheduled for a laparoscopic tubal ligation next month.  She has a  history of anxiety.  She has had some significant social and family  issues involving her son and her daughter.  She is scheduled for  counseling next week in [place].  She has been on Klonopin and Ambien  p.r.n. for anxiety during the pregnancy, but she is currently not on a  medication.  When she delivered, she was positive for cocaine and  opiates.  She has significant anxiety and tearfulness.  She denies any  suicidal ideation.  She is quite tearful today.  We will start her on  Wellbutrin 150 mg daily.  Gave her prescription for Klonopin 1 mg q.12  h. 20 tablets.  For sleep, I gave her a prescription for Ambien 10 mg 1  p.o. nightly 20 tablets.  She will return in 1 week to review her  progress and she was urge to keep her appointment with a counselor next  week as well.      Scheryl Darter, MD     JA/MEDQ  D:  08/25/2009  T:  08/26/2009  Job:  [id number]

## 2011-02-01 LAB — URINALYSIS, ROUTINE W REFLEX MICROSCOPIC
Bilirubin Urine: NEGATIVE
Leukocytes, UA: NEGATIVE
Nitrite: NEGATIVE
Specific Gravity, Urine: 1.025
Urobilinogen, UA: 0.2
pH: 6.5

## 2011-02-01 LAB — URINE MICROSCOPIC-ADD ON: WBC, UA: NONE SEEN

## 2011-02-01 LAB — POCT PREGNANCY, URINE: Operator id: 11741

## 2011-02-01 LAB — WET PREP, GENITAL: Yeast Wet Prep HPF POC: NONE SEEN

## 2011-02-01 LAB — GC/CHLAMYDIA PROBE AMP, GENITAL: GC Probe Amp, Genital: NEGATIVE

## 2011-02-01 LAB — CBC
MCHC: 34.4
MCV: 92
Platelets: 389
WBC: 11.8 — ABNORMAL HIGH

## 2011-02-01 LAB — DIFFERENTIAL
Basophils Relative: 1
Eosinophils Absolute: 0.2
Neutrophils Relative %: 64

## 2011-02-06 LAB — POCT PREGNANCY, URINE
Operator id: 288331
Preg Test, Ur: NEGATIVE

## 2011-02-06 LAB — POCT I-STAT, CHEM 8
Calcium, Ion: 1.19
HCT: 43
TCO2: 24

## 2011-02-07 LAB — URINALYSIS, ROUTINE W REFLEX MICROSCOPIC
Glucose, UA: 100 — AB
Ketones, ur: NEGATIVE
Leukocytes, UA: NEGATIVE
Nitrite: NEGATIVE
Protein, ur: NEGATIVE

## 2011-02-07 LAB — CBC
HCT: 39.8
MCHC: 34.1
Platelets: 321
RDW: 14.8

## 2011-02-07 LAB — GC/CHLAMYDIA PROBE AMP, GENITAL: Chlamydia, DNA Probe: NEGATIVE

## 2011-02-07 LAB — WET PREP, GENITAL: Trich, Wet Prep: NONE SEEN

## 2011-02-07 LAB — POCT PREGNANCY, URINE: Operator id: 12921

## 2011-02-09 LAB — CBC
Hemoglobin: 13.7
Platelets: 331
RDW: 12.5

## 2011-02-09 LAB — CULTURE, ROUTINE-ABSCESS

## 2011-02-09 LAB — DIFFERENTIAL
Basophils Absolute: 0
Lymphocytes Relative: 29
Monocytes Absolute: 0.8
Neutro Abs: 6.9
Neutrophils Relative %: 61

## 2011-02-09 LAB — BASIC METABOLIC PANEL
Chloride: 101
GFR calc Af Amer: >60
GFR calc non Af Amer: >60
Potassium: 4.4
Sodium: 134 — ABNORMAL LOW

## 2011-02-16 LAB — URINALYSIS, ROUTINE W REFLEX MICROSCOPIC
Bilirubin Urine: NEGATIVE
Glucose, UA: NEGATIVE
Protein, ur: NEGATIVE
Urobilinogen, UA: 1

## 2011-02-16 LAB — I-STAT 8, (EC8 V) (CONVERTED LAB)
Chloride: 106
HCT: 46
Hemoglobin: 15.6 — ABNORMAL HIGH
Potassium: 4
Sodium: 138
TCO2: 26
pH, Ven: 7.409 — ABNORMAL HIGH

## 2011-02-16 LAB — POCT I-STAT CREATININE
Creatinine, Ser: 0.9
Operator id: 198171

## 2011-02-16 LAB — PREGNANCY, URINE: Preg Test, Ur: NEGATIVE

## 2011-02-16 LAB — CBC
Hemoglobin: 14
MCHC: 33.9
RBC: 4.52
RDW: 13.1

## 2011-02-16 LAB — DIFFERENTIAL
Basophils Absolute: 0
Basophils Relative: 0
Lymphocytes Relative: 12
Monocytes Absolute: 0.7
Neutro Abs: 11.3 — ABNORMAL HIGH
Neutrophils Relative %: 82 — ABNORMAL HIGH

## 2011-02-16 LAB — URINE MICROSCOPIC-ADD ON

## 2011-02-19 LAB — BASIC METABOLIC PANEL
BUN: 15
CO2: 22
Calcium: 9.3
Chloride: 107
Creatinine, Ser: 0.85
GFR calc Af Amer: >60
GFR calc non Af Amer: >60
Glucose, Bld: 89
Potassium: 3.7
Sodium: 137

## 2011-02-19 LAB — URINALYSIS, ROUTINE W REFLEX MICROSCOPIC
Bilirubin Urine: NEGATIVE
Glucose, UA: NEGATIVE
Ketones, ur: 15 — AB
Nitrite: NEGATIVE
Protein, ur: NEGATIVE
Specific Gravity, Urine: 1.023
Urobilinogen, UA: 1
pH: 6

## 2011-02-19 LAB — CBC
HCT: 38.9
Hemoglobin: 13.4
MCHC: 34.5
MCV: 90.9
Platelets: 341
RBC: 4.28
RDW: 13.3
WBC: 15.6 — ABNORMAL HIGH

## 2011-02-19 LAB — URINE MICROSCOPIC-ADD ON

## 2011-02-19 LAB — DIFFERENTIAL
Basophils Absolute: 0
Basophils Relative: 0
Eosinophils Absolute: 0.2
Eosinophils Relative: 1
Lymphocytes Relative: 16
Lymphs Abs: 2.4
Monocytes Absolute: 1.3 — ABNORMAL HIGH
Monocytes Relative: 8
Neutro Abs: 11.7 — ABNORMAL HIGH
Neutrophils Relative %: 75

## 2011-02-19 LAB — WET PREP, GENITAL
Trich, Wet Prep: NONE SEEN
Yeast Wet Prep HPF POC: NONE SEEN

## 2011-02-19 LAB — GC/CHLAMYDIA PROBE AMP, GENITAL: GC Probe Amp, Genital: NEGATIVE

## 2011-02-19 LAB — POCT PREGNANCY, URINE
Operator id: 27011
Preg Test, Ur: NEGATIVE

## 2011-12-09 ENCOUNTER — Emergency Department: Payer: Self-pay | Admitting: Emergency Medicine

## 2011-12-09 IMAGING — CR DG THORACIC SPINE 2-3V
1 series · 3 of 3 positions shown · non-contrast
Comparison: None

REASON FOR EXAM: fall
COMMENTS:

PROCEDURE:     DXR - DXR THORACIC  AP AND LATERAL  - [DATE] [DATE]
RESULT:     History: Pain

[Series 1: ap · 0.17mm/px · 3 of 3 slices shown]
[im 1/3]
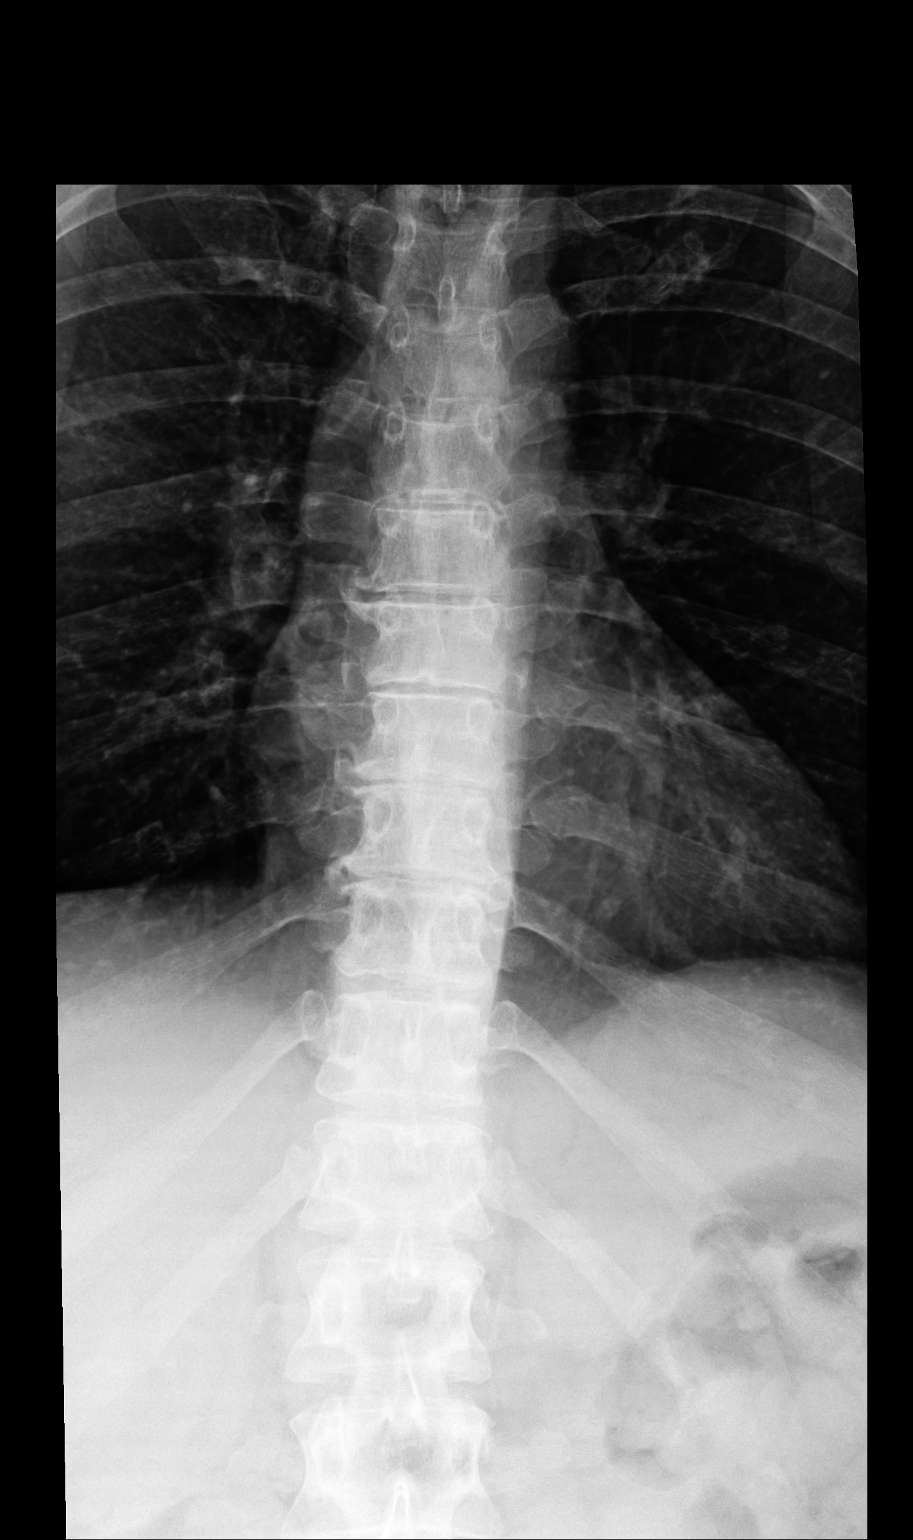
[im 2/3]
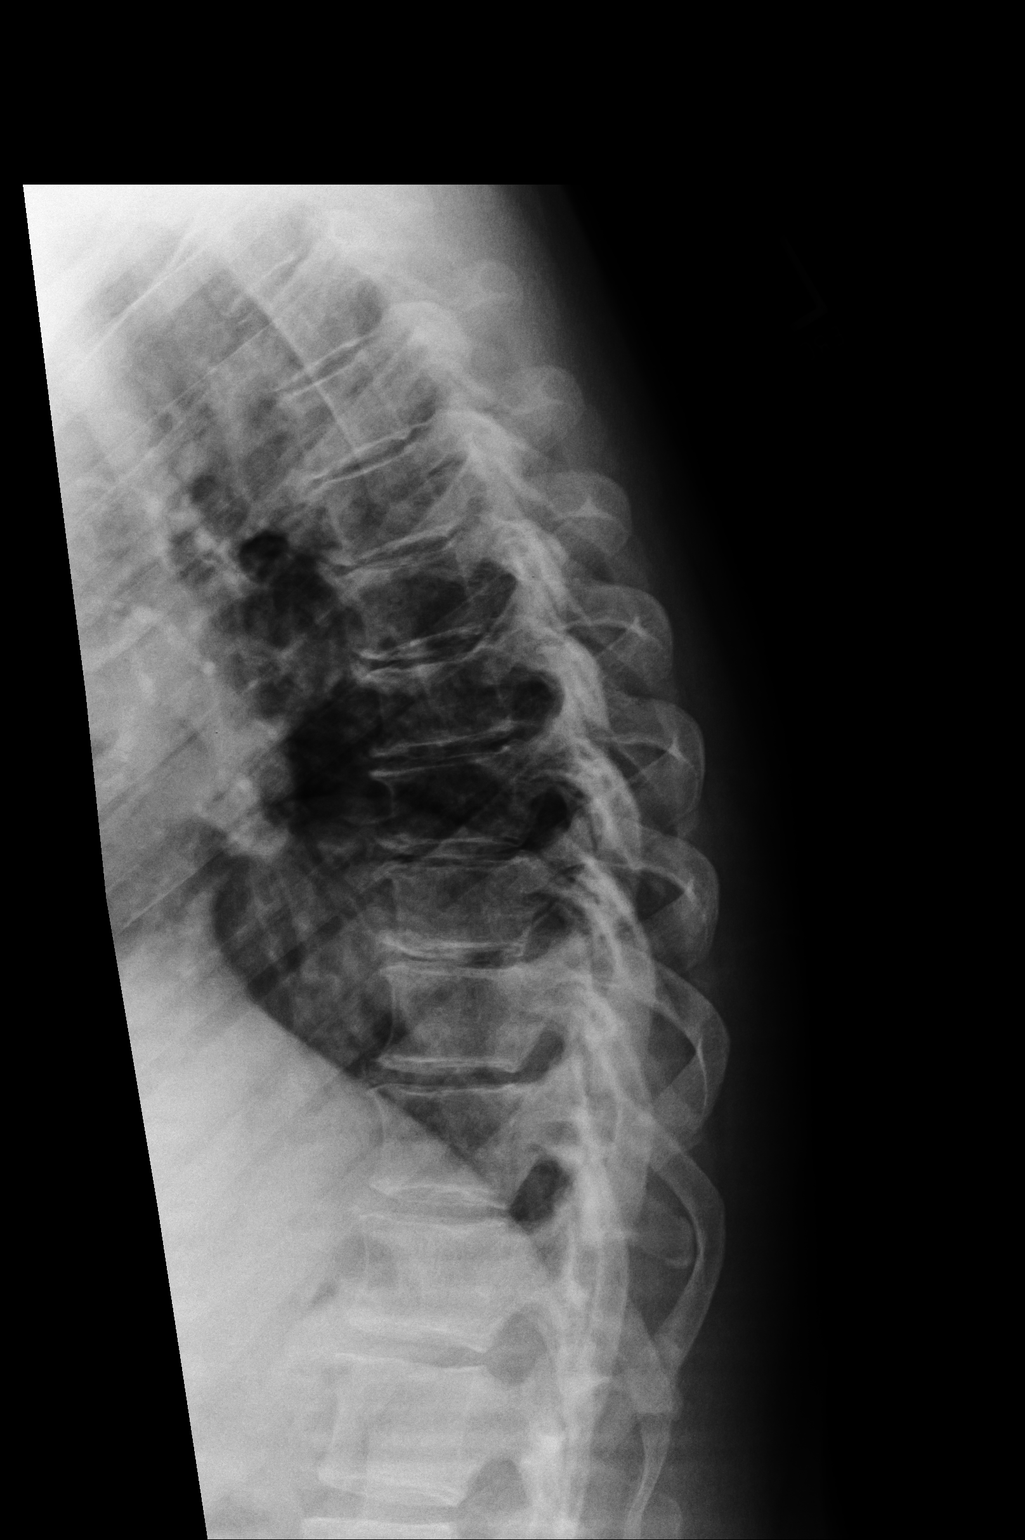
[im 3/3]
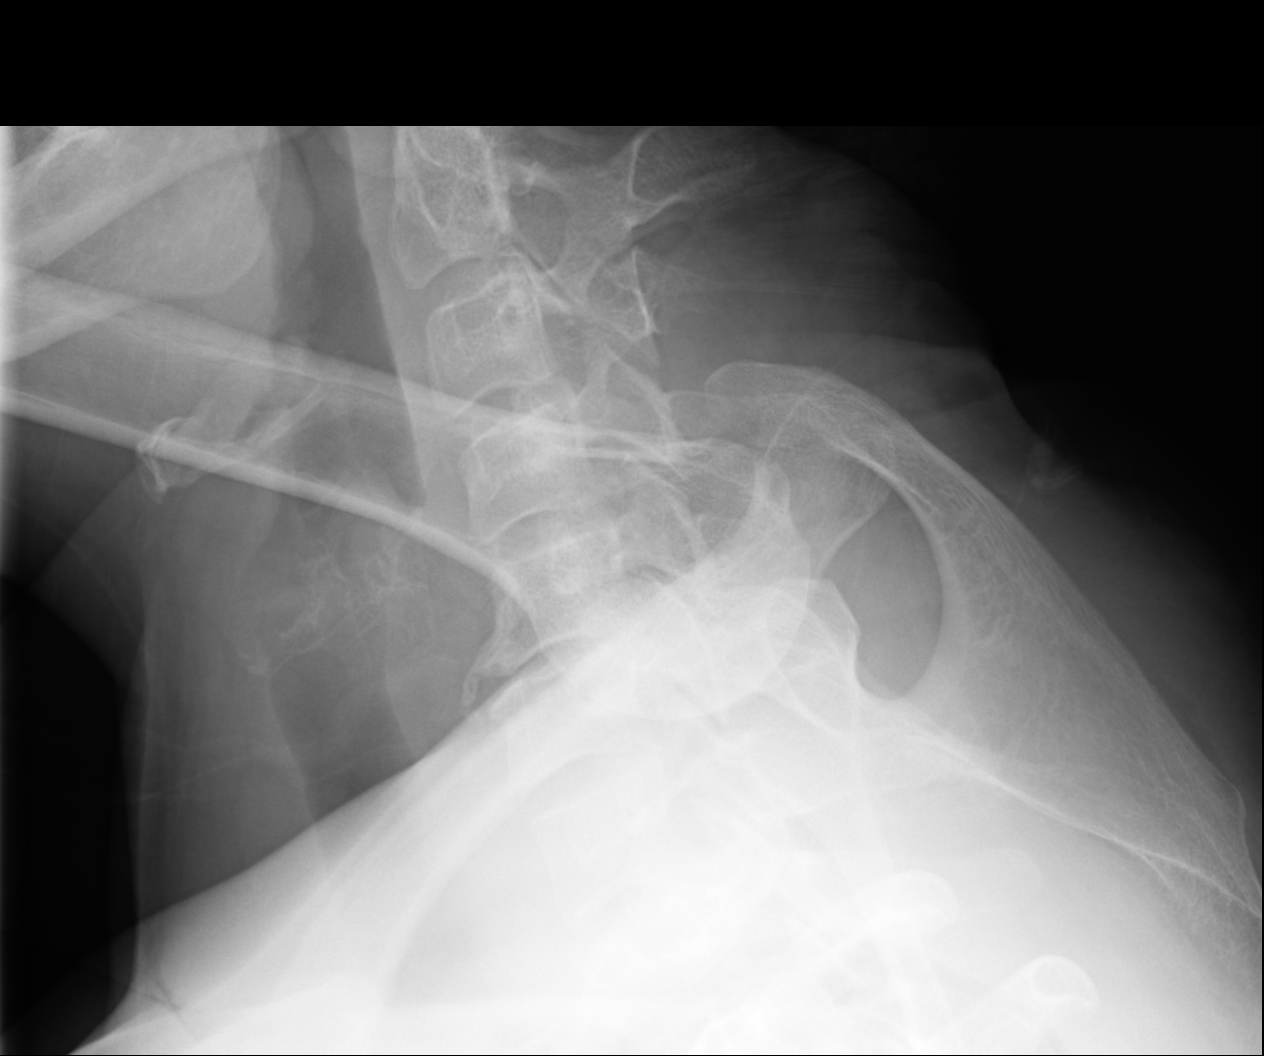

[3 of 3 positions shown; findings below may reference images not displayed]

FINDINGS: AP and lateral views of the thoracic spine are provided.

The vertebral body heights are maintained. The alignment is anatomic. There
is no spondylolysis. There is no acute fracture or static listhesis. The
disc spaces are maintained.

The visualized portions of the lungs are clear.
IMPRESSION: 1. No acute osseous abnormality of the thoracic spine.

[REDACTED]

## 2011-12-09 IMAGING — CT CT HEAD WITHOUT CONTRAST
2 series · 16 of 30 positions shown, 20 images · non-contrast
Comparison: none

REASON FOR EXAM: fall
COMMENTS:

PROCEDURE:     CT  - CT HEAD WITHOUT CONTRAST  - [DATE] [DATE]
RESULT:     Comparison:  None
TECHNIQUE: Multiple axial images from the foramen magnum to the vertex were
obtained without IV contrast.

[Series 2: without · axial · non-contrast · 0.42mm/px · z∈[-182,-47]mm · 13 of 33 slices shown, 17 images]
[im 3/33  brain]
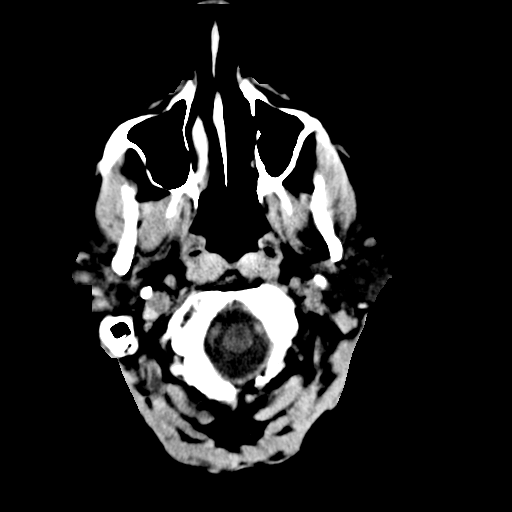
[im 3/33  bone]
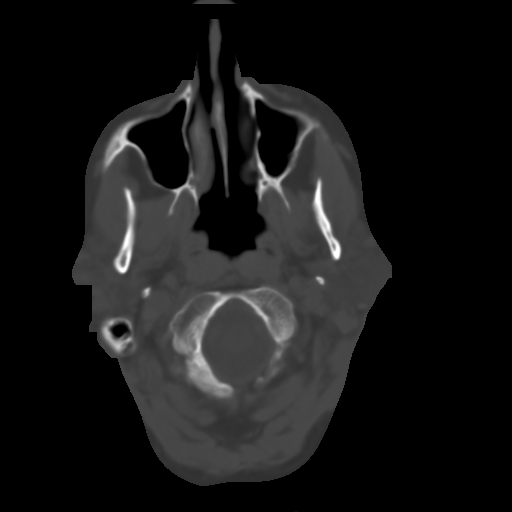
[im 5/33  brain]
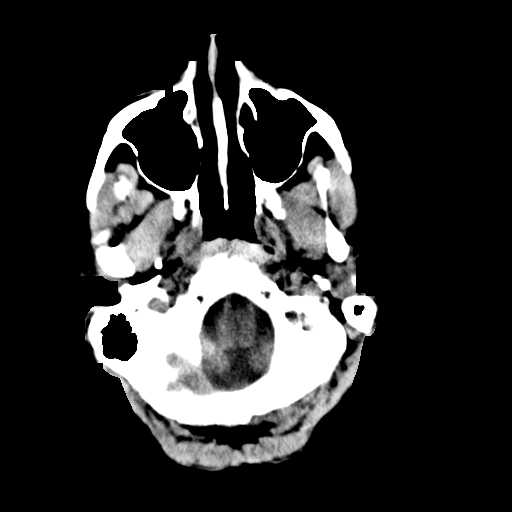
[im 7/33  brain]
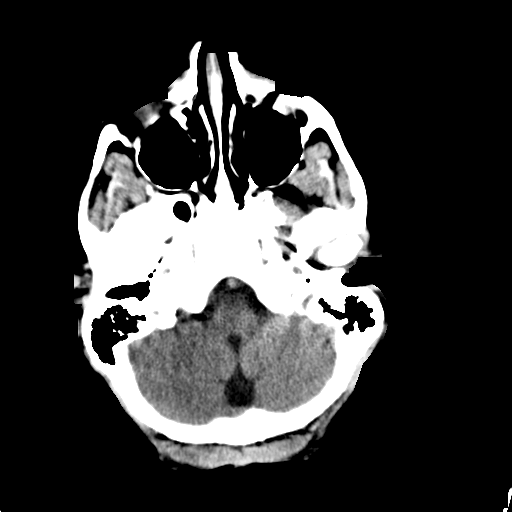
[im 10/33  brain]
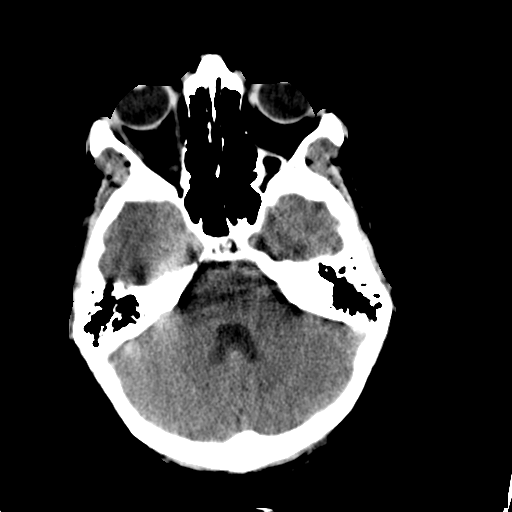
[im 12/33  brain]
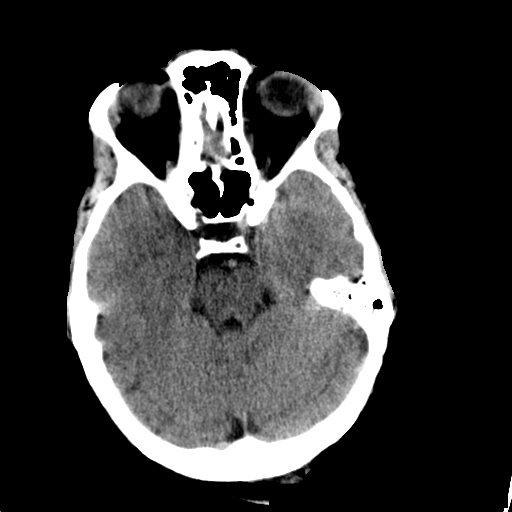
[im 12/33  bone]
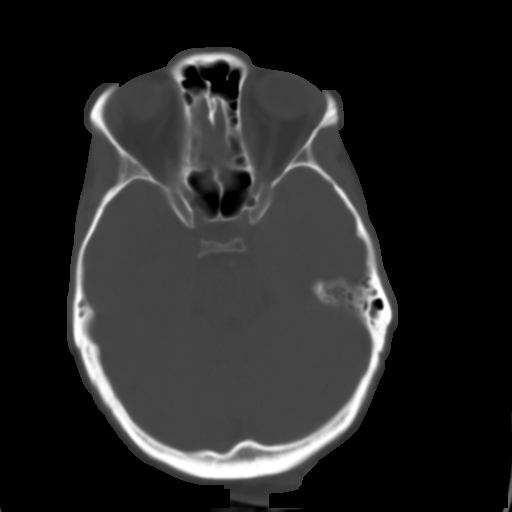
[im 14/33  brain]
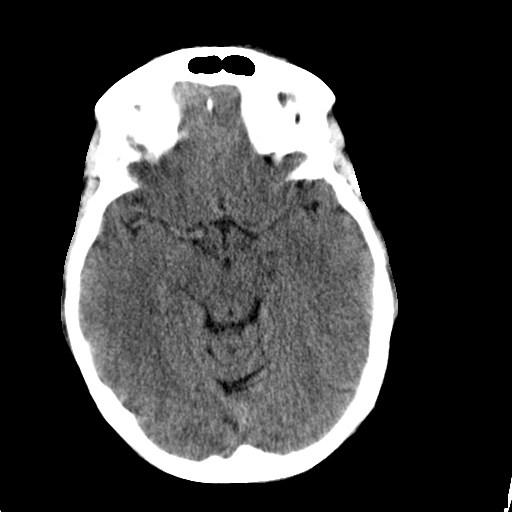
[im 17/33  brain]
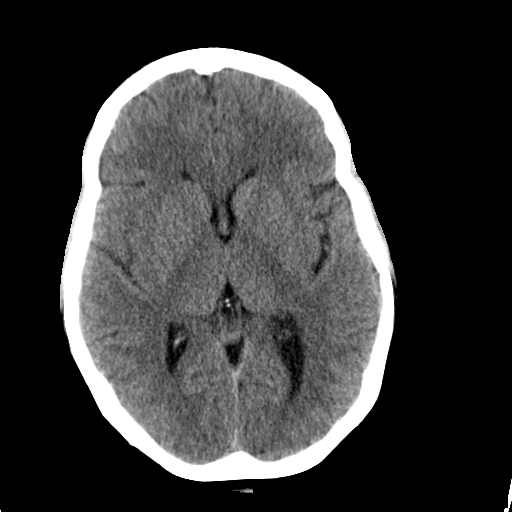
[im 19/33  brain]
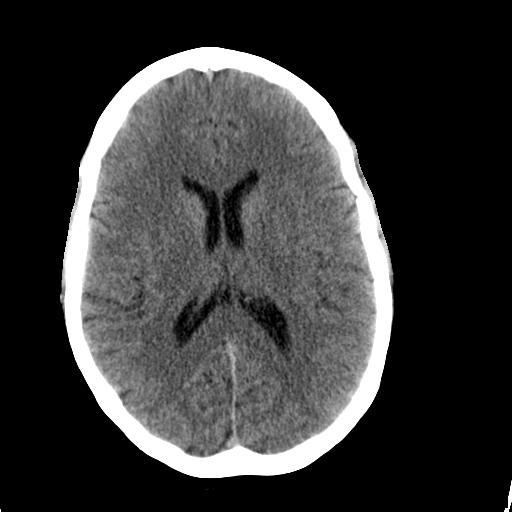
[im 21/33  brain]
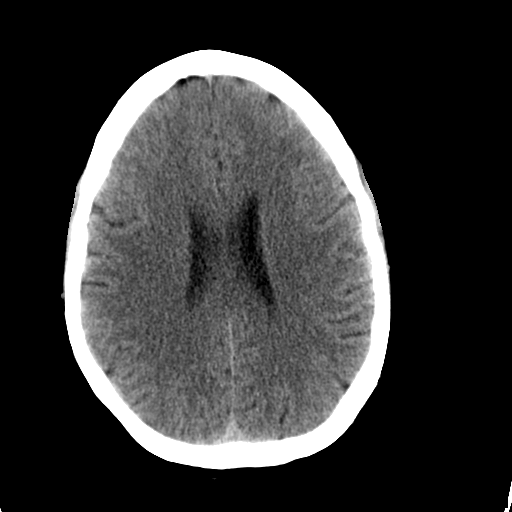
[im 21/33  bone]
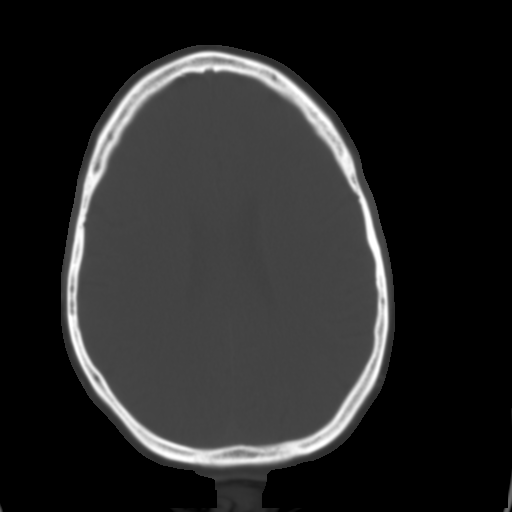
[im 23/33  brain]
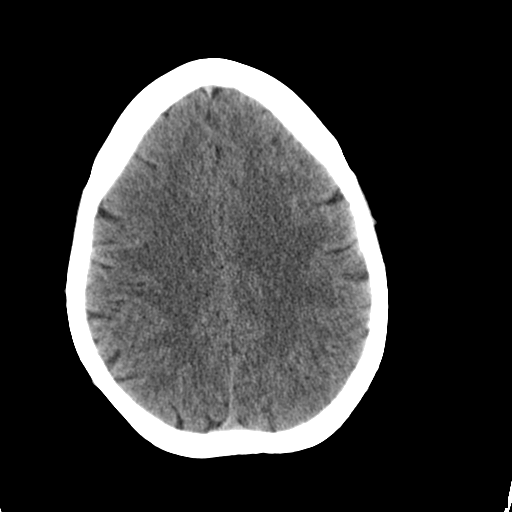
[im 26/33  brain]
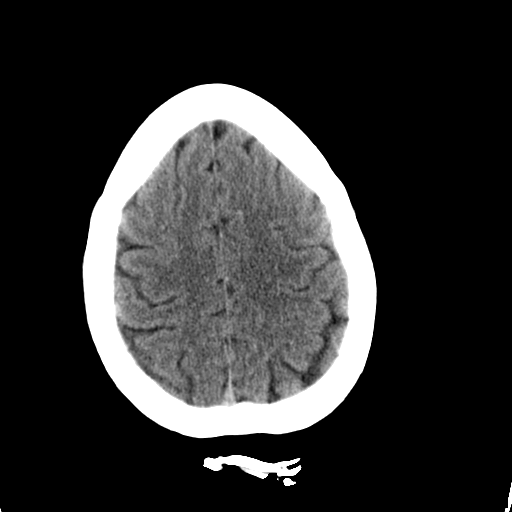
[im 28/33  brain]
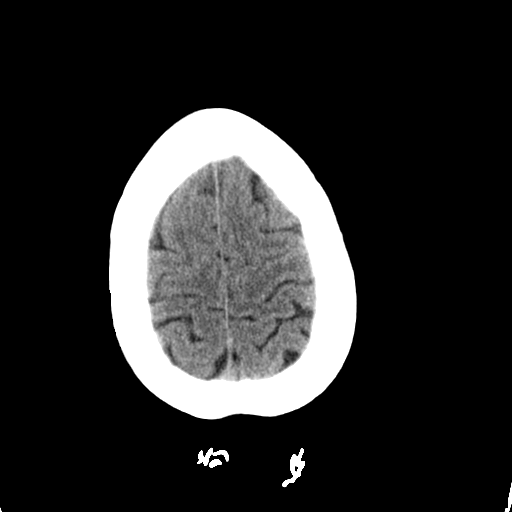
[im 30/33  brain]
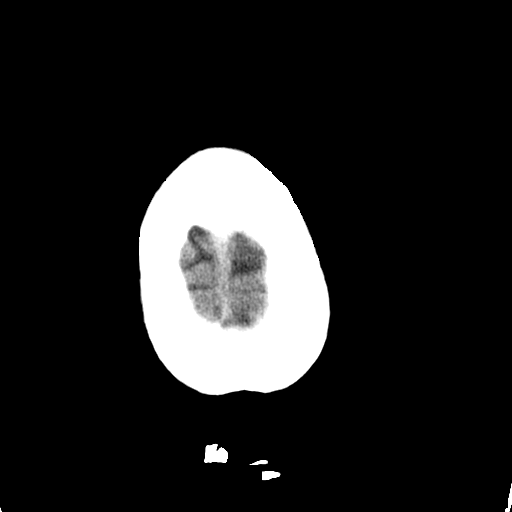
[im 30/33  bone]
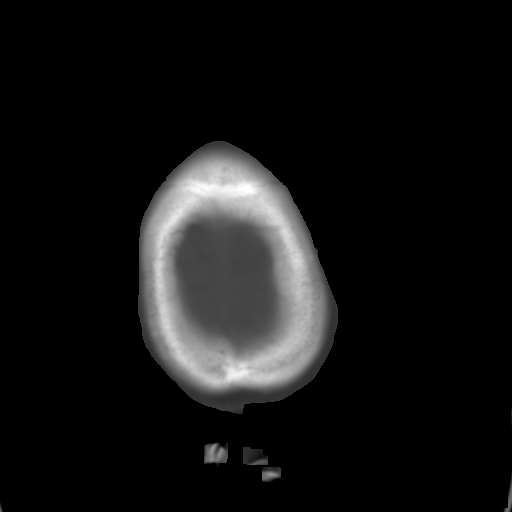

[Series 3: bone · axial · 0.42mm/px · z∈[-182,-137]mm · 3 of 33 slices shown]
[im 3/33  bone]
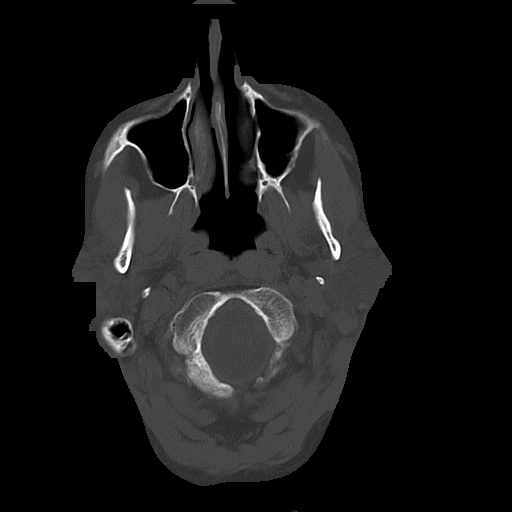
[im 7/33  bone]
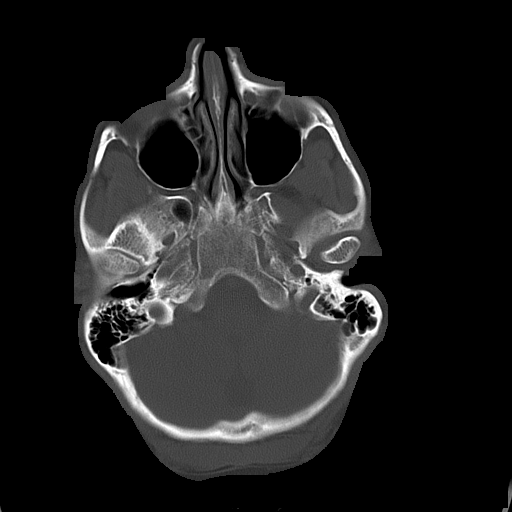
[im 12/33  bone]
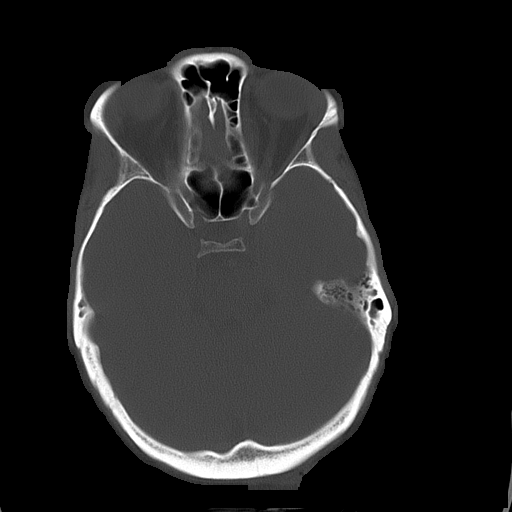

[16 of 30 positions shown; findings below may reference images not displayed]

FINDINGS: There is no evidence for mass effect, midline shift, or extra-axial fluid
collections. There is no evidence for space-occupying lesion, intracranial
hemorrhage, or cortical-based area of infarction. There is a laceration
along the left occipital scalp.

The osseous structures are unremarkable.
IMPRESSION: No acute intracranial process.

## 2011-12-09 IMAGING — CR DG LUMBAR SPINE 2-3V
1 series · 4 of 4 positions shown · non-contrast
Comparison: none

REASON FOR EXAM: fall
COMMENTS:

PROCEDURE:     DXR - DXR LUMBAR SPINE AP AND LATERAL  - [DATE] [DATE]
RESULT:     Comparison: None

[Series 1: ap · 0.17mm/px · 4 of 4 slices shown]
[im 1/4]
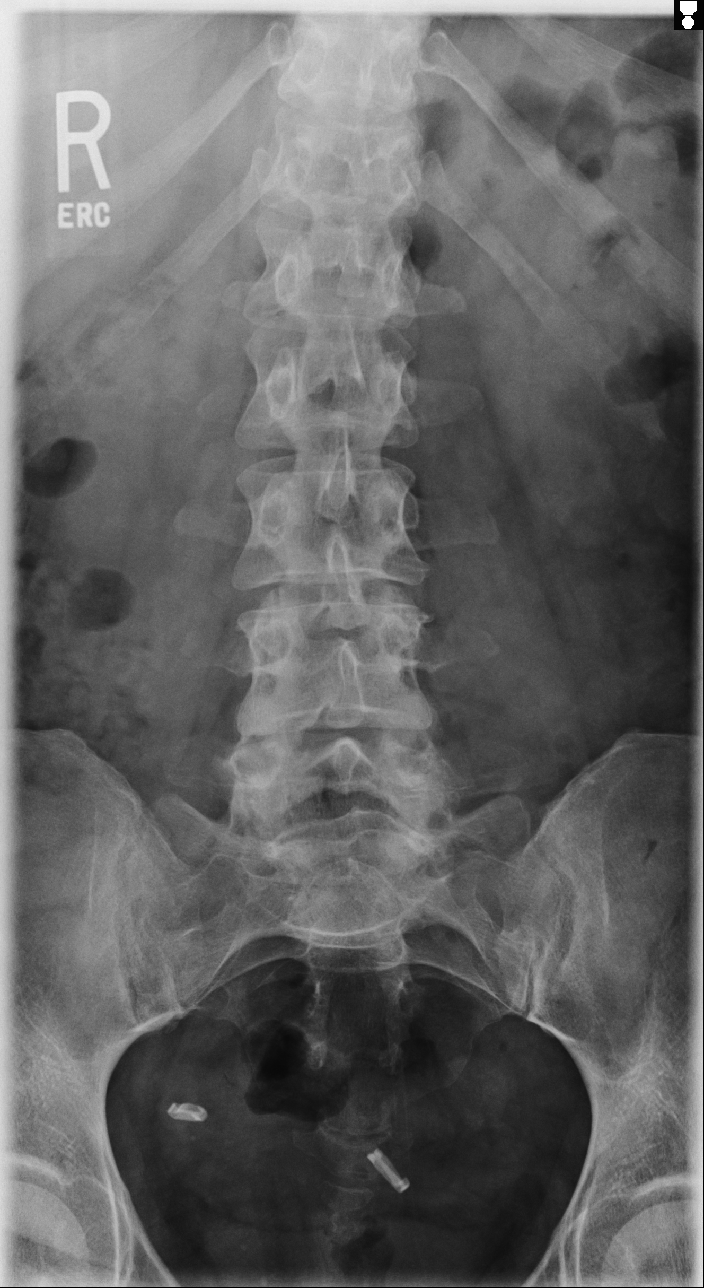
[im 2/4]
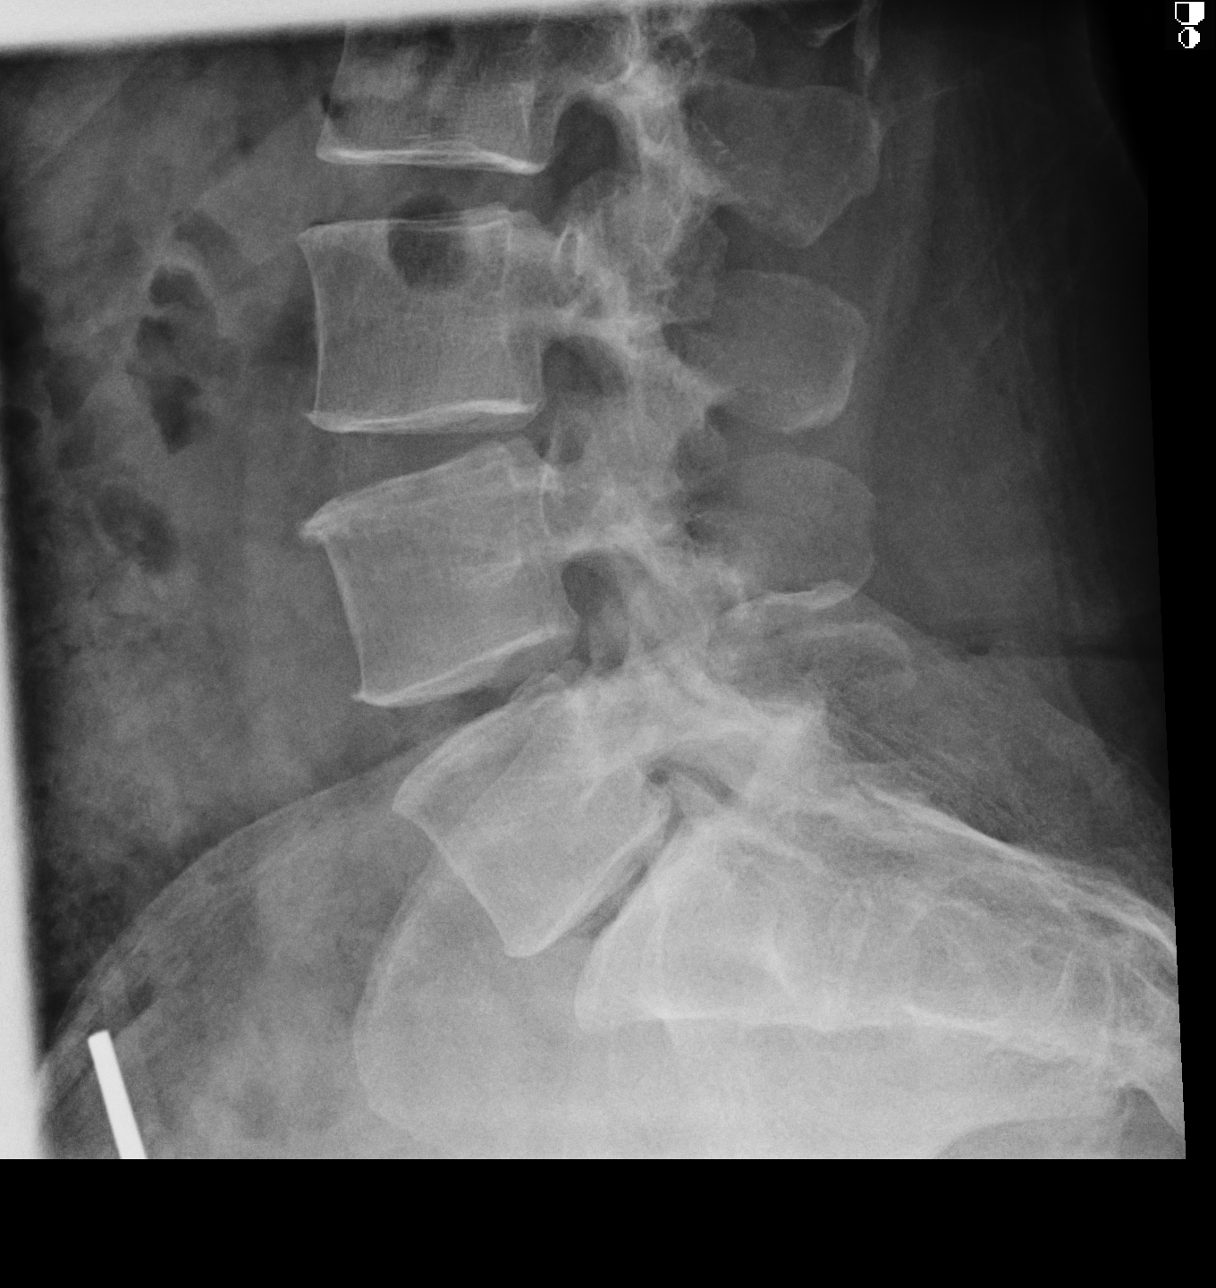
[im 3/4]
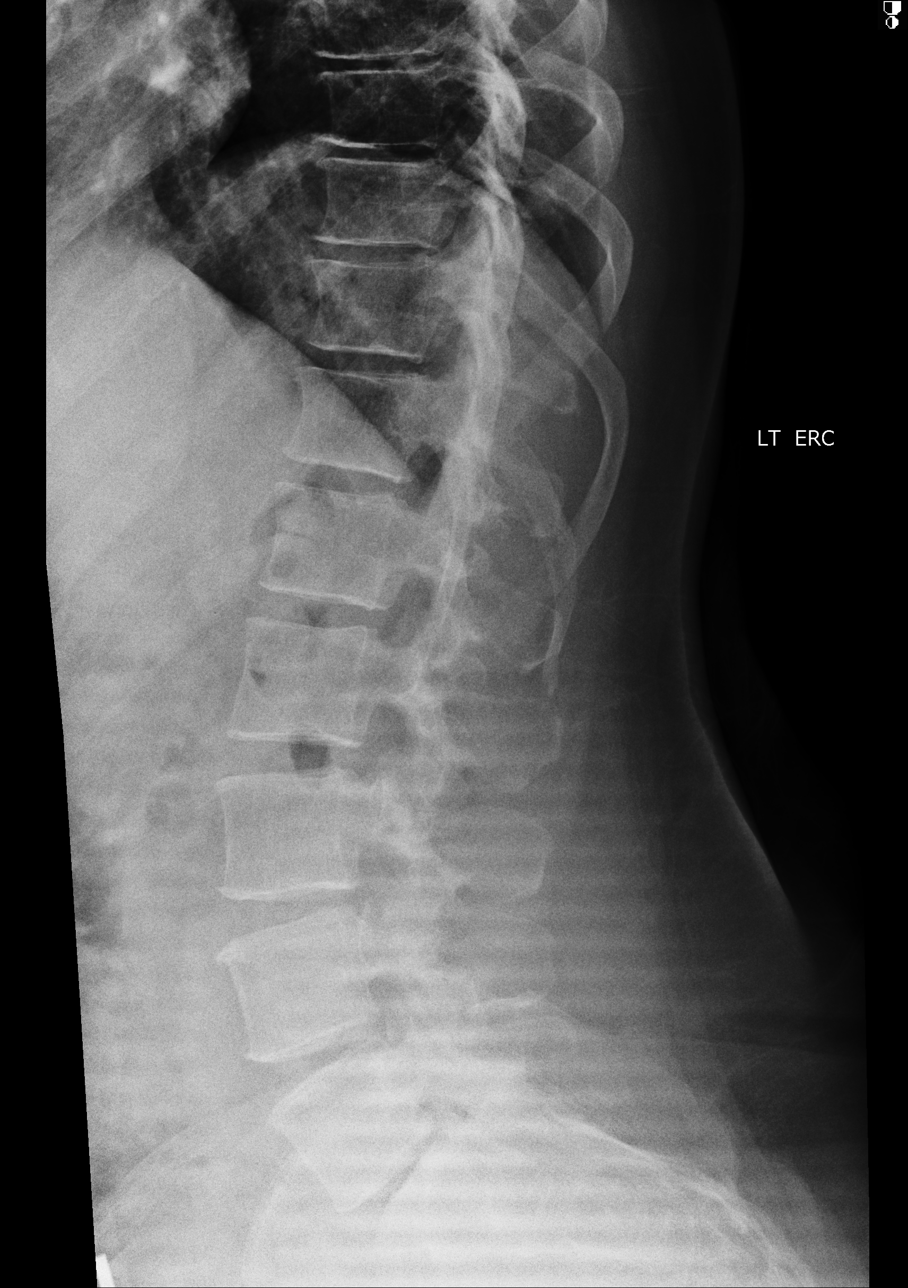
[im 4/4]
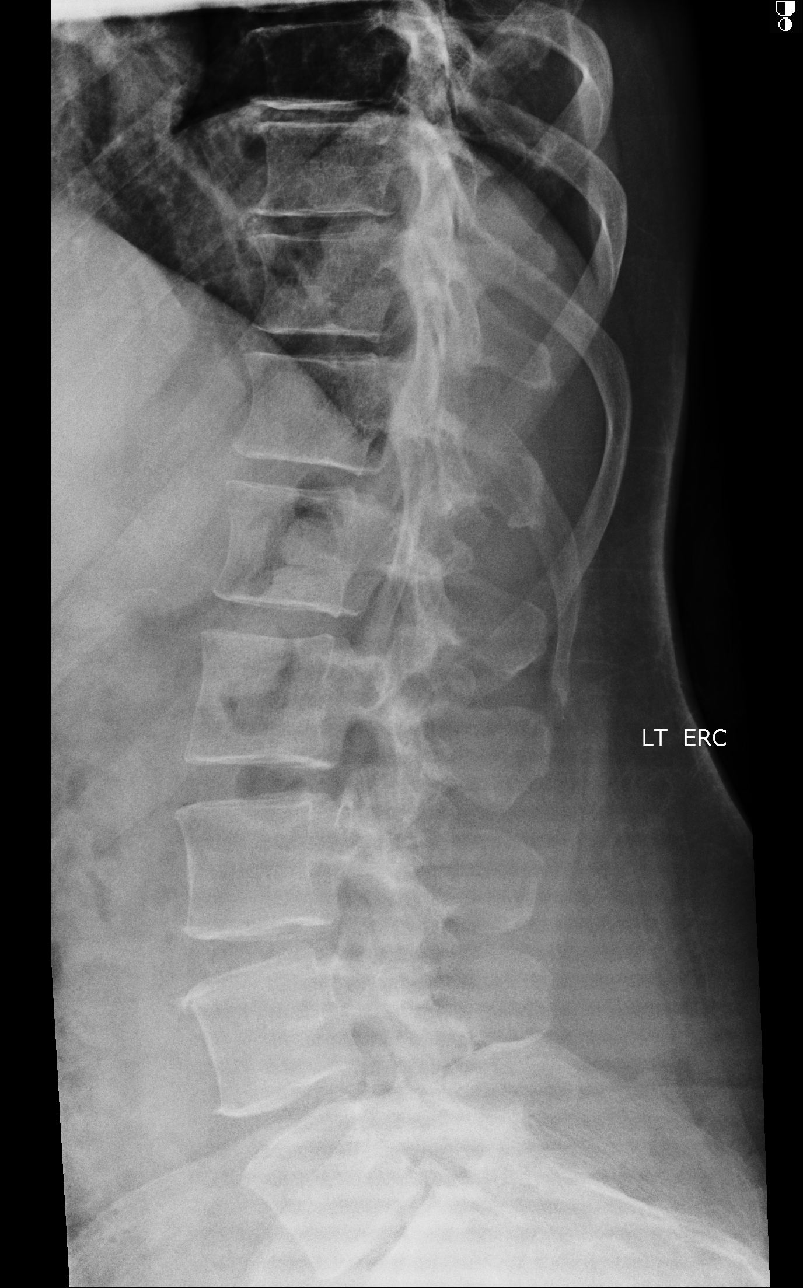

[4 of 4 positions shown; findings below may reference images not displayed]

FINDINGS: AP and lateral views of the lumbar spine and a coned down view of the
lumbosacral junction are provided.

There are 5 nonrib bearing lumbar-type vertebral bodies. The vertebral body
heights are maintained. The alignment is anatomic. There is no
spondylolysis. There is no acute fracture or static listhesis. There is
degenerative disc disease and degenerative facet arthropathy at L5-S1.

The SI joints are unremarkable.
IMPRESSION: 1. No acute osseous abnormality of the lumbar spine.

[REDACTED]

## 2013-01-21 ENCOUNTER — Emergency Department: Payer: Self-pay | Admitting: Internal Medicine

## 2013-02-25 ENCOUNTER — Ambulatory Visit (INDEPENDENT_AMBULATORY_CARE_PROVIDER_SITE_OTHER): Payer: Medicaid Other | Admitting: General Surgery

## 2013-02-25 ENCOUNTER — Encounter: Payer: Self-pay | Admitting: General Surgery

## 2013-02-25 VITALS — BP 142/80 | HR 76 | Resp 14 | Ht 70.0 in | Wt 274.0 lb

## 2013-02-25 DIAGNOSIS — K8010 Calculus of gallbladder with chronic cholecystitis without obstruction: Secondary | ICD-10-CM | POA: Insufficient documentation

## 2013-02-25 HISTORY — DX: Calculus of gallbladder with chronic cholecystitis without obstruction: K80.10

## 2013-02-25 NOTE — Progress Notes (Signed)
Patient ID: [name], female   DOB: 1970/12/12, 43 y.o.   MRN: [id number]  Chief Complaint  Patient presents with  . Other    gallstones    HPI [name] is a 42 y.o. female  Here for gallstones.  Ultrasound done 02-02-13.  Stats she has been having right upper quadrant abdominal pain off/on for about 1 year.  Recently over the past 4-6 months it has been more frequent and pain up around her right shoulder and shoulder blades.  She has nausea daily but no vomiting.  Pain does not seem to be triggered by a particular food. She has been avoiding spicy foods. Her reflux has gotten worse.  HPI  Past Medical History  Diagnosis Date  . Arthritis   . Migraine   . Hypertension   . GERD (gastroesophageal reflux disease)     Past Surgical History  Procedure Laterality Date  . Tonsillectomy    . Tubal ligation      History reviewed. No pertinent family history.  Social History History  Substance Use Topics  . Smoking status: Current Every Day Smoker    Types: Cigarettes  . Smokeless tobacco: Never Used  . Alcohol Use: Yes    No Known Allergies  Current Outpatient Prescriptions  Medication Sig Dispense Refill  . butalbital-acetaminophen-caffeine (FIORICET, ESGIC) 50-325-40 MG per tablet Take 1 tablet by mouth every 6 (six) hours as needed for headache.      . ondansetron (ZOFRAN) 4 MG tablet Take 4 mg by mouth every 8 (eight) hours as needed for nausea.       No current facility-administered medications for this visit.    Review of Systems Review of Systems  Constitutional: Negative.   Respiratory: Negative.   Cardiovascular: Negative.   Gastrointestinal: Positive for diarrhea.    Blood pressure 142/80, pulse 76, resp. rate 14, height 5\' 10"  (1.778 m), weight 274 lb (124.286 kg).  Physical Exam Physical Exam  Constitutional: She is oriented to person, place, and time. She appears well-developed and well-nourished.  Eyes: Conjunctivae are normal. No scleral  icterus.  Neck: Neck supple.  Cardiovascular: Normal rate, regular rhythm and normal heart sounds.   Pulmonary/Chest: Effort normal and breath sounds normal.  Abdominal: Soft. Normal appearance and bowel sounds are normal. Mass: right upper quadrant on deep breathing a distended gall bladder is palpable.   There is no hepatosplenomegaly. There is tenderness in the right upper quadrant.  Lymphadenopathy:    She has no cervical adenopathy.  Neurological: She is alert and oriented to person, place, and time.  Skin: Skin is warm and dry.    Data Reviewed Ultrasound and lab work from Dr Lacie Scotts office.  Assessment    Abdominal tenderness right abdomen.  Symptomatic cholelithiasis.     Plan    Laparoscopic Cholecystectomy with Intraoperative Cholangiogram. The procedure, including it's potential risks and complications (including but not limited to infection, bleeding, injury to intra-abdominal organs or bile ducts, bile leak, poor cosmetic result, sepsis and death) were discussed with the patient in detail. Non-operative options, including their inherent risks (acute calculous cholecystitis with possible choledocholithiasis or gallstone pancreatitis, with the risk of ascending cholangitis, sepsis, and death) were discussed as well. The patient expressed and understanding of what we discussed and wishes to proceed with laparoscopic cholecystectomy. The patient further understands that if it is technically not possible, or it is unsafe to proceed laparoscopically, that I will convert to an open cholecystectomy.     Patient's surgery has  been scheduled for 03-12-13 at [facility].  SANKAR,SEEPLAPUTHUR G 02/25/2013, 4:31 PM

## 2013-02-25 NOTE — Patient Instructions (Addendum)
The patient is aware to call back for any questions or concerns.   Laparoscopic Cholecystectomy Laparoscopic cholecystectomy is surgery to remove the gallbladder. The gallbladder is located slightly to the right of center in the abdomen, behind the liver. It is a concentrating and storage sac for the bile produced in the liver. Bile aids in the digestion and absorption of fats. Gallbladder disease (cholecystitis) is an inflammation of your gallbladder. This condition is usually caused by a buildup of gallstones (cholelithiasis) in your gallbladder. Gallstones can block the flow of bile, resulting in inflammation and pain. In severe cases, emergency surgery may be required. When emergency surgery is not required, you will have time to prepare for the procedure. Laparoscopic surgery is an alternative to open surgery. Laparoscopic surgery usually has a shorter recovery time. Your common bile duct may also need to be examined and explored. Your caregiver will discuss this with you if he or she feels this should be done. If stones are found in the common bile duct, they may be removed. LET YOUR CAREGIVER KNOW ABOUT:  Allergies to food or medicine.  Medicines taken, including vitamins, herbs, eyedrops, over-the-counter medicines, and creams.  Use of steroids (by mouth or creams).  Previous problems with anesthetics or numbing medicines.  History of bleeding problems or blood clots.  Previous surgery.  Other health problems, including diabetes and kidney problems.  Possibility of pregnancy, if this applies. RISKS AND COMPLICATIONS All surgery is associated with risks. Some problems that may occur following this procedure include:  Infection.  Damage to the common bile duct, nerves, arteries, veins, or other internal organs such as the stomach or intestines.  Bleeding.  A stone may remain in the common bile duct. BEFORE THE PROCEDURE  Do not take aspirin for 3 days prior to surgery or  blood thinners for 1 week prior to surgery.  Do not eat or drink anything after midnight the night before surgery.  Let your caregiver know if you develop a cold or other infectious problem prior to surgery.  You should be present 60 minutes before the procedure or as directed. PROCEDURE  You will be given medicine that makes you sleep (general anesthetic). When you are asleep, your surgeon will make several small cuts (incisions) in your abdomen. One of these incisions is used to insert a small, lighted scope (laparoscope) into the abdomen. The laparoscope helps the surgeon see into your abdomen. Carbon dioxide gas will be pumped into your abdomen. The gas allows more room for the surgeon to perform your surgery. Other operating instruments are inserted through the other incisions. Laparoscopic procedures may not be appropriate when:  There is major scarring from previous surgery.  The gallbladder is extremely inflamed.  There are bleeding disorders or unexpected cirrhosis of the liver.  A pregnancy is near term.  Other conditions make the laparoscopic procedure impossible. If your surgeon feels it is not safe to continue with a laparoscopic procedure, he or she will perform an open abdominal procedure. In this case, the surgeon will make an incision to open the abdomen. This gives the surgeon a larger view and field to work within. This may allow the surgeon to perform procedures that sometimes cannot be performed with a laparoscope alone. Open surgery has a longer recovery time. AFTER THE PROCEDURE  You will be taken to the recovery area where a nurse will watch and check your progress.  You may be allowed to go home the same day.  Do not resume physical  activities until directed by your caregiver.  You may resume a normal diet and activities as directed. Document Released: 04/30/2005 Document Revised: 07/23/2011 Document Reviewed: 10/13/2010 [facility] Patient Information 2014  [place], [place].  Patient's surgery has been scheduled for 03-12-13 at [facility].

## 2013-02-26 ENCOUNTER — Other Ambulatory Visit: Payer: Self-pay | Admitting: General Surgery

## 2013-02-26 DIAGNOSIS — K8010 Calculus of gallbladder with chronic cholecystitis without obstruction: Secondary | ICD-10-CM

## 2013-03-05 ENCOUNTER — Ambulatory Visit: Payer: Self-pay | Admitting: General Surgery

## 2013-03-11 ENCOUNTER — Ambulatory Visit: Payer: Self-pay | Admitting: Anesthesiology

## 2013-03-12 ENCOUNTER — Encounter: Payer: Self-pay | Admitting: General Surgery

## 2013-03-12 ENCOUNTER — Ambulatory Visit: Payer: Self-pay | Admitting: General Surgery

## 2013-03-12 DIAGNOSIS — K8010 Calculus of gallbladder with chronic cholecystitis without obstruction: Secondary | ICD-10-CM

## 2013-03-12 IMAGING — CR DG CHOLANGIOGRAM OPERATIVE
1 series · 2 of 2 positions shown · non-contrast
Comparison: none

REASON FOR EXAM: surgery
COMMENTS:

[Series 6001: (person_name) · 2 of 2 slices shown]
[im 1/2]
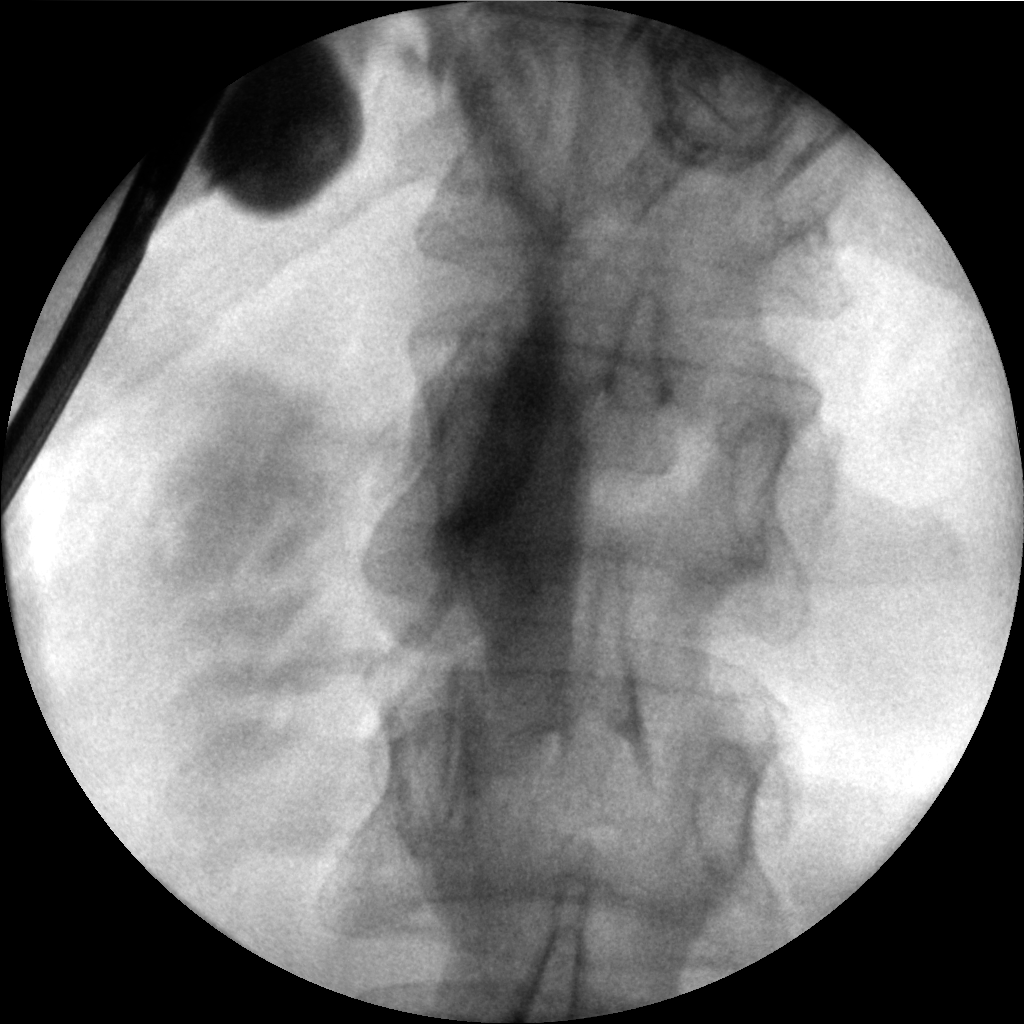
[im 2/2]
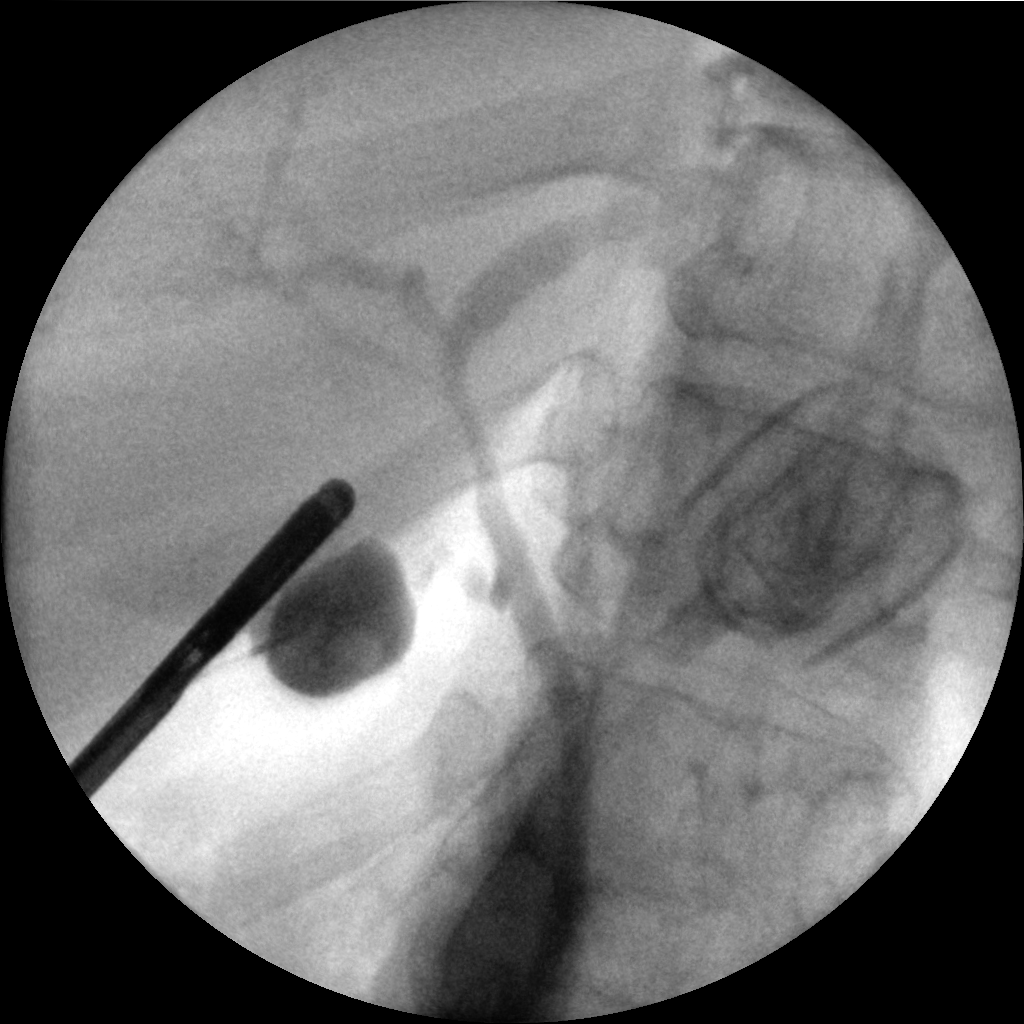

[2 of 2 positions shown; findings below may reference images not displayed]

PROCEDURE:     DXR - DXR CHOLANGIOGRAM OP (INITIAL)  - [DATE]  [DATE]

RESULT:     In images from an intraoperative laparoscopic cholangiogram
showed the common bile duct, common hepatic duct and left and right hepatic
ducts as well as the cystic duct without definite filling defect or
obstructive change. Contrast is seen in the small bowel. The distal common
duct is nondistended. There is a portion of the mid to proximal common bile
duct which is not well seen.
IMPRESSION: Please see above.

[REDACTED]

## 2013-03-13 ENCOUNTER — Encounter: Payer: Self-pay | Admitting: General Surgery

## 2013-03-13 LAB — PATHOLOGY REPORT

## 2013-03-16 ENCOUNTER — Encounter: Payer: Self-pay | Admitting: General Surgery

## 2013-03-17 ENCOUNTER — Ambulatory Visit: Payer: Self-pay | Admitting: General Surgery

## 2013-03-19 ENCOUNTER — Other Ambulatory Visit: Payer: Self-pay

## 2013-03-23 ENCOUNTER — Ambulatory Visit: Payer: Medicaid Other | Admitting: General Surgery

## 2013-04-22 ENCOUNTER — Encounter: Payer: Self-pay | Admitting: Anesthesiology

## 2013-08-14 ENCOUNTER — Emergency Department: Payer: Self-pay | Admitting: Emergency Medicine

## 2014-03-15 ENCOUNTER — Encounter: Payer: Self-pay | Admitting: General Surgery

## 2014-04-22 ENCOUNTER — Emergency Department: Payer: Self-pay | Admitting: Emergency Medicine

## 2014-09-03 NOTE — Op Note (Signed)
PATIENT NAME:  Alicia MannsHAMES, Velencia MR#:  [id number] DATE OF BIRTH:  05-09-1971  DATE OF PROCEDURE:  03/12/2013  PREOPERATIVE DIAGNOSIS: Chronic cholecystitis, cholelithiasis.   POSTOPERATIVE DIAGNOSIS: Chronic cholecystitis, cholelithiasis.   OPERATION: Laparoscopy and cholecystectomy with intraoperative cholangiogram.   SURGEON: S.G. Evette CristalSankar, MD  ANESTHESIA: General.   COMPLICATIONS: None.   ESTIMATED BLOOD LOSS: Less than 25 mL.   DRAINS: None.   DESCRIPTION OF PROCEDURE: The patient was put to sleep in the supine position on the operating table. The abdomen was prepped and draped out as a sterile field. A small incision was made just above the umbilicus, and the Veress needle with the InnerDyne sleeve was positioned within the peritoneal cavity and verified with the hanging drop method. Pneumoperitoneum was obtained, and a 10 mm port was placed. The camera was introduced, with good visualization of the peritoneal cavity and no apparent injury to the underlying structures from initial entry. Epigastric and 2 lateral 5 mm ports were placed. The gallbladder was moderately distended, but showed no acute changes. There was thickening of its wall and some adhesions in the midportion down around the Hartmann's pouch area. With careful retraction and exposure, the adhesions were taken down. The Hartmann's pouch was then pulled up and noted to contain a fairly large stone, though it was not impacted, but movable. This was milked upward so the Hartmann's pouch could be adequately dissected further. The cystic duct was identified and freed. Kumar clamp and catheter were positioned, and a cholangiogram was performed. There was good filling of the bile duct both proximal and distal, with no evidence of obstruction or retained stone. The catheter was used to decompress the gallbladder and then removed, and the cystic duct was hemo-clipped and cut. The cystic artery was then identified with its main and its posterior  branch, and it was freed, hemo-clipped and cut. The gallbladder was dissected free from its bed using cautery for control of bleeding. A small amount of fluid was used to irrigate out the area and suctioned out. The gallbladder was then placed in the retrieval bag and brought out through the umbilical port site. it required enlargement of the skin the fascial incision of a couple of millimeters to allow removal due to a large stone of over 2.5 cm. After this was removed, the fascial opening at the umbilicus was closed with 2 sutures of 0 Vicryl placed with a suture passer. After ensuring hemostasis, the pneumoperitoneum was released. The ports were removed. The skin incisions were closed with subcuticular 4-0 Vicryl reinforced with Steri-Strips. A dry sterile dressing was placed. The patient tolerated the procedure well. She was subsequently extubated and returned to the recovery room in stable condition.   ____________________________ S.Wynona LunaG. Odean Mcelwain, MD sgs:lb D: 03/13/2013 08:53:59 ET T: 03/13/2013 09:01:28 ET JOB#: [id number]  cc: Timoteo ExposeS.G. Evette CristalSankar, MD, <Dictator> [facility]EEPLAPUTH Wynona LunaG Brookie Wayment MD ELECTRONICALLY SIGNED 03/16/2013 19:02

## 2016-02-09 ENCOUNTER — Emergency Department
Admission: EM | Admit: 2016-02-09 | Discharge: 2016-02-09 | Disposition: A | Discharge status: Home / Self Care | Payer: Medicaid Other | Attending: Emergency Medicine | Admitting: Emergency Medicine

## 2016-02-09 ENCOUNTER — Encounter: Payer: Self-pay | Admitting: Emergency Medicine

## 2016-02-09 DIAGNOSIS — L089 Local infection of the skin and subcutaneous tissue, unspecified: Secondary | ICD-10-CM

## 2016-02-09 DIAGNOSIS — N390 Urinary tract infection, site not specified: Secondary | ICD-10-CM | POA: Insufficient documentation

## 2016-02-09 DIAGNOSIS — F17210 Nicotine dependence, cigarettes, uncomplicated: Secondary | ICD-10-CM | POA: Insufficient documentation

## 2016-02-09 DIAGNOSIS — B373 Candidiasis of vulva and vagina: Secondary | ICD-10-CM | POA: Diagnosis not present

## 2016-02-09 DIAGNOSIS — B9562 Methicillin resistant Staphylococcus aureus infection as the cause of diseases classified elsewhere: Secondary | ICD-10-CM | POA: Insufficient documentation

## 2016-02-09 DIAGNOSIS — L292 Pruritus vulvae: Secondary | ICD-10-CM | POA: Diagnosis present

## 2016-02-09 DIAGNOSIS — I10 Essential (primary) hypertension: Secondary | ICD-10-CM | POA: Diagnosis not present

## 2016-02-09 DIAGNOSIS — B3731 Acute candidiasis of vulva and vagina: Secondary | ICD-10-CM

## 2016-02-09 DIAGNOSIS — A4902 Methicillin resistant Staphylococcus aureus infection, unspecified site: Secondary | ICD-10-CM

## 2016-02-09 HISTORY — DX: Carrier or suspected carrier of methicillin resistant Staphylococcus aureus: Z22.322

## 2016-02-09 LAB — URINALYSIS COMPLETE WITH MICROSCOPIC (ARMC ONLY)
Bilirubin Urine: NEGATIVE
Glucose, UA: NEGATIVE mg/dL
KETONES UR: NEGATIVE mg/dL
Nitrite: NEGATIVE
PH: 6 (ref 5.0–8.0)
PROTEIN: 100 mg/dL — AB
Specific Gravity, Urine: 1.018 (ref 1.005–1.030)

## 2016-02-09 LAB — POCT PREGNANCY, URINE: PREG TEST UR: NEGATIVE

## 2016-02-09 MED ORDER — FLUCONAZOLE 150 MG PO TABS: 150.0000 mg | ORAL_TABLET | Freq: Every day | ORAL | 0 refills | Status: DC

## 2016-02-09 MED ORDER — MUPIROCIN 2 % EX OINT: 1.0000 "application " | TOPICAL_OINTMENT | Freq: Two times a day (BID) | CUTANEOUS | 0 refills | Status: DC

## 2016-02-09 MED ORDER — SULFAMETHOXAZOLE-TRIMETHOPRIM 800-160 MG PO TABS: 1.0000 | ORAL_TABLET | Freq: Two times a day (BID) | ORAL | 0 refills | Status: DC

## 2016-02-09 NOTE — ED Triage Notes (Signed)
Patient presents to ED with urinary discomfort, burning and frequency with some vaginal itching. Patient reports that she tried OTC yeast infection treatment for 3 days and is still having some itching. Patient reports that she had an dental infection and was on an antibiotic for this. Patient also reports some bumps above her bikini line and is concerned since she has a history of MRSA.

## 2016-02-09 NOTE — ED Provider Notes (Signed)
[facility] Emergency Department Provider Note  ____________________________________________  Time seen: Approximately 3:50 PM  I have reviewed the triage vital signs and the nursing notes.   HISTORY  Chief Complaint Vaginal Itching    HPI [name] is a 45 y.o. female , NAD, presents to the emergency department with four-day history of vaginal itching, dysuria, urinary urgency and increased urinary frequency as well as skin sores to the lower abdomen. Patient states she has been on amoxicillin for a dental infection over the last week. Has noted vaginal irritation and itching with some white clumpy discharge over the last 4-5 days. Used a 3 day over-the-counter yeast infection treatmentin which she states helped decrease the amount of discharge but she continues to have itching and irritation. Also notes some increase in urinary frequency and dysuria over the last 24 hours. States she has a history of MRSA and has had multiple open skin sores that are painful about her lower abdomen. Denies fevers, chills, body aches. Has had nausea or vomiting. Has not had any lower back pain. Denies abdominal pain, flank pain. Denies hematuria, urinary urgency. No pelvic pain. No chest pain or shortness of breath. Patient denies any history of exposure to STDs and declines evaluation of such.   Past Medical History:  Diagnosis Date  . Arthritis   . GERD (gastroesophageal reflux disease)   . Hypertension   . Migraine   . MRSA carrier     Patient Active Problem List   Diagnosis Date Noted  . Cholecystitis with cholelithiasis 02/25/2013    Past Surgical History:  Procedure Laterality Date  . CHOLECYSTECTOMY    . TONSILLECTOMY    . TUBAL LIGATION      Prior to Admission medications   Medication Sig Start Date End Date Taking? Authorizing Provider  butalbital-acetaminophen-caffeine (FIORICET, ESGIC) 50-325-40 MG per tablet Take 1 tablet by mouth every 6 (six) hours  as needed for headache.    Historical Provider, MD  fluconazole (DIFLUCAN) 150 MG tablet Take 1 tablet (150 mg total) by mouth daily. Take one tablet today and another in 3 days if no better 02/09/16   Jami L Hagler, PA-C  mupirocin ointment (BACTROBAN) 2 % Apply 1 application topically 2 (two) times daily. 02/09/16   Jami L Hagler, PA-C  ondansetron (ZOFRAN) 4 MG tablet Take 4 mg by mouth every 8 (eight) hours as needed for nausea.    Historical Provider, MD  sulfamethoxazole-trimethoprim (BACTRIM DS,SEPTRA DS) 800-160 MG tablet Take 1 tablet by mouth 2 (two) times daily. 02/09/16   Jami L Hagler, PA-C    Allergies Review of patient's allergies indicates no known allergies.  No family history on file.  Social History Social History  Substance Use Topics  . Smoking status: Current Every Day Smoker    Types: Cigarettes  . Smokeless tobacco: Never Used  . Alcohol use Yes     Comment: socially about 1 a month      Review of Systems  Constitutional: No fever/chills. Cardiovascular: No chest pain. Respiratory: No shortness of breath.   Gastrointestinal: No abdominal pain nor flank pain.  No nausea, vomiting.   Genitourinary: Positive for dysuria, increased urinary frequency, urinary urgency. Positive vaginal itching and discharge. No pelvic pain. No hematuria, urinary hesitancy. Musculoskeletal: Negative for back pain.  Skin: Positive skin sores abdomen. Negative for rash, swelling. Neurological: Negative for headaches. 10-point ROS otherwise negative.  ____________________________________________   PHYSICAL EXAM:  VITAL SIGNS: ED Triage Vitals  Enc Vitals Group  BP 02/09/16 1530 (!) 151/82     Pulse Rate 02/09/16 1530 88     Resp 02/09/16 1530 16     Temp 02/09/16 1530 98.4 F (36.9 C)     Temp Source 02/09/16 1530 Oral     SpO2 02/09/16 1530 98 %     Weight 02/09/16 1531 280 lb (127 kg)     Height 02/09/16 1531 5\' 10"  (1.778 m)     Head Circumference --      Peak Flow  --      Pain Score 02/09/16 1543 4     Pain Loc --      Pain Edu? --      Excl. in GC? --      Constitutional: Alert and oriented. Well appearing and in no acute distress. Eyes: Conjunctivae are normal.  Head: Atraumatic. Cardiovascular: Normal rate, regular rhythm. Normal S1 and S2.  Good peripheral circulation with 2+ pulses in bilateral lower extremities. Respiratory: Normal respiratory effort without tachypnea or retractions. Lungs CTAB with breath sounds noted in all lung fields. Gastrointestinal: Soft and nontender without distention or guarding in all quadrants. No rebound or rigidity. Bowel sounds grossly normal active in all quadrants. Musculoskeletal: No lower extremity tenderness nor edema.  No joint effusions. Neurologic:  Normal speech and language. No gross focal neurologic deficits are appreciated.  Skin:  6 open skin sores noted about the lower abdomen with scant purulent drainage. No induration is noted about these lesions and all are soft. Moderate tenderness to palpation of these lesions. No vesicles noted. Skin is warm, dry and intact.  Psychiatric: Mood and affect are normal. Speech and behavior are normal. Patient exhibits appropriate insight and judgement.   ____________________________________________   LABS (all labs ordered are listed, but only abnormal results are displayed)  Labs Reviewed  URINALYSIS COMPLETEWITH MICROSCOPIC ([facility] ONLY) - Abnormal; Notable for the following:       Result Value   Color, Urine YELLOW (*)    APPearance CLOUDY (*)    Hgb urine dipstick 2+ (*)    Protein, ur 100 (*)    Leukocytes, UA 3+ (*)    Bacteria, UA RARE (*)    Squamous Epithelial / LPF TOO NUMEROUS TO COUNT (*)    All other components within normal limits  URINE CULTURE  POC URINE PREG, ED  POCT PREGNANCY, URINE     ____________________________________________  EKG  None ____________________________________________  RADIOLOGY  None ____________________________________________    PROCEDURES  Procedure(s) performed: None   Procedures   Medications - No data to display   ____________________________________________   INITIAL IMPRESSION / ASSESSMENT AND PLAN / ED COURSE  Pertinent labs & imaging results that were available during my care of the patient were reviewed by me and considered in my medical decision making (see chart for details).  Clinical Course    Patient's diagnosis is consistent with Lower urinary tract infection, vaginal yeast infection and MRSA skin infection. Urine culture was sent to the lab for evaluation and results called to the patient if medications need to be changed. Patient will be discharged home with prescriptions for Bactrim DS, Bactroban ointment and fluconazole to take as directed. Patient is to follow up with her primary care provider if symptoms persist past this treatment course. Patient is given ED precautions to return to the ED for any worsening or new symptoms.      ____________________________________________  FINAL CLINICAL IMPRESSION(S) / ED DIAGNOSES  Final diagnoses:  Lower urinary tract infection  Vaginal yeast infection  Infection of skin due to methicillin resistant Staphylococcus aureus (MRSA)      NEW MEDICATIONS STARTED DURING THIS VISIT:  Discharge Medication List as of 02/09/2016  4:41 PM    START taking these medications   Details  fluconazole (DIFLUCAN) 150 MG tablet Take 1 tablet (150 mg total) by mouth daily. Take one tablet today and another in 3 days if no better, Starting Thu 02/09/2016, Print    mupirocin ointment (BACTROBAN) 2 % Apply 1 application topically 2 (two) times daily., Starting Thu 02/09/2016, Print    sulfamethoxazole-trimethoprim (BACTRIM DS,SEPTRA DS) 800-160 MG tablet Take 1 tablet by mouth 2  (two) times daily., Starting Thu 02/09/2016, Print             Ernestene Kiel Chester, PA-C 02/09/16 1738    Emily Filbert, MD 02/09/16 [phone]

## 2016-02-10 LAB — URINE CULTURE

## 2017-04-12 DIAGNOSIS — F32A Depression, unspecified: Secondary | ICD-10-CM

## 2017-04-12 DIAGNOSIS — F339 Major depressive disorder, recurrent, unspecified: Secondary | ICD-10-CM | POA: Insufficient documentation

## 2017-04-12 DIAGNOSIS — I10 Essential (primary) hypertension: Secondary | ICD-10-CM | POA: Insufficient documentation

## 2017-04-12 HISTORY — DX: Depression, unspecified: F32.A

## 2021-05-21 ENCOUNTER — Other Ambulatory Visit: Payer: Self-pay

## 2021-05-21 ENCOUNTER — Emergency Department: Payer: Medicaid Other

## 2021-05-21 ENCOUNTER — Encounter: Payer: Self-pay | Admitting: Emergency Medicine

## 2021-05-21 ENCOUNTER — Emergency Department
Admission: EM | Admit: 2021-05-21 | Discharge: 2021-05-21 | Disposition: A | Discharge status: Home / Self Care | Payer: Medicaid Other | Attending: Emergency Medicine | Admitting: Emergency Medicine

## 2021-05-21 DIAGNOSIS — R600 Localized edema: Secondary | ICD-10-CM | POA: Diagnosis not present

## 2021-05-21 DIAGNOSIS — M25569 Pain in unspecified knee: Secondary | ICD-10-CM | POA: Insufficient documentation

## 2021-05-21 DIAGNOSIS — G8929 Other chronic pain: Secondary | ICD-10-CM | POA: Insufficient documentation

## 2021-05-21 DIAGNOSIS — M25559 Pain in unspecified hip: Secondary | ICD-10-CM | POA: Diagnosis not present

## 2021-05-21 DIAGNOSIS — R609 Edema, unspecified: Secondary | ICD-10-CM

## 2021-05-21 DIAGNOSIS — I10 Essential (primary) hypertension: Secondary | ICD-10-CM | POA: Diagnosis not present

## 2021-05-21 LAB — CBC WITH DIFFERENTIAL/PLATELET
Abs Immature Granulocytes: 0.04 10*3/uL (ref 0.00–0.07)
Basophils Absolute: 0.1 10*3/uL (ref 0.0–0.1)
Basophils Relative: 1 %
Eosinophils Absolute: 0.1 10*3/uL (ref 0.0–0.5)
Eosinophils Relative: 1 %
HCT: 44.3 % (ref 36.0–46.0)
Hemoglobin: 14.4 g/dL (ref 12.0–15.0)
Immature Granulocytes: 0 %
Lymphocytes Relative: 22 %
Lymphs Abs: 2.6 10*3/uL (ref 0.7–4.0)
MCH: 28.9 pg (ref 26.0–34.0)
MCHC: 32.5 g/dL (ref 30.0–36.0)
MCV: 89 fL (ref 80.0–100.0)
Monocytes Absolute: 0.5 10*3/uL (ref 0.1–1.0)
Monocytes Relative: 4 %
Neutro Abs: 8.4 10*3/uL — ABNORMAL HIGH (ref 1.7–7.7)
Neutrophils Relative %: 72 %
Platelets: 374 10*3/uL (ref 150–400)
RBC: 4.98 MIL/uL (ref 3.87–5.11)
RDW: 14.3 % (ref 11.5–15.5)
WBC: 11.8 10*3/uL — ABNORMAL HIGH (ref 4.0–10.5)
nRBC: 0 % (ref 0.0–0.2)

## 2021-05-21 LAB — COMPREHENSIVE METABOLIC PANEL
ALT: 16 U/L (ref 0–44)
AST: 15 U/L (ref 15–41)
Albumin: 3.8 g/dL (ref 3.5–5.0)
Alkaline Phosphatase: 77 U/L (ref 38–126)
Anion gap: 8 (ref 5–15)
BUN: 13 mg/dL (ref 6–20)
CO2: 27 mmol/L (ref 22–32)
Calcium: 9.4 mg/dL (ref 8.9–10.3)
Chloride: 101 mmol/L (ref 98–111)
Creatinine, Ser: 0.7 mg/dL (ref 0.44–1.00)
GFR, Estimated: >60 mL/min (ref >60)
Glucose, Bld: 123 mg/dL — ABNORMAL HIGH (ref 70–99)
Potassium: 3.9 mmol/L (ref 3.5–5.1)
Sodium: 136 mmol/L (ref 135–145)
Total Bilirubin: 0.7 mg/dL (ref 0.3–1.2)
Total Protein: 7.8 g/dL (ref 6.5–8.1)

## 2021-05-21 IMAGING — US US EXTREM LOW VENOUS
1 series · 14 of 24 positions shown · non-contrast
Comparison: None.

CLINICAL DATA: Leg swelling for 3-4 days

EXAM:
BILATERAL LOWER EXTREMITY VENOUS DOPPLER ULTRASOUND
TECHNIQUE: Gray-scale sonography with compression, as well as color and duplex
ultrasound, were performed to evaluate the deep venous system(s)
from the level of the common femoral vein through the popliteal and
proximal calf veins.

[Series 1: us venous img lower bilat (dvt) · portal-venous · 14 of 64 slices shown]
[im 1/64]
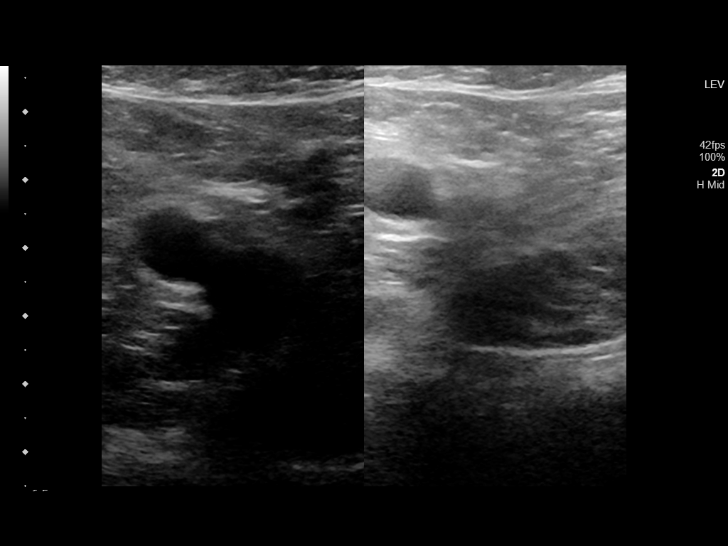
[im 6/64]
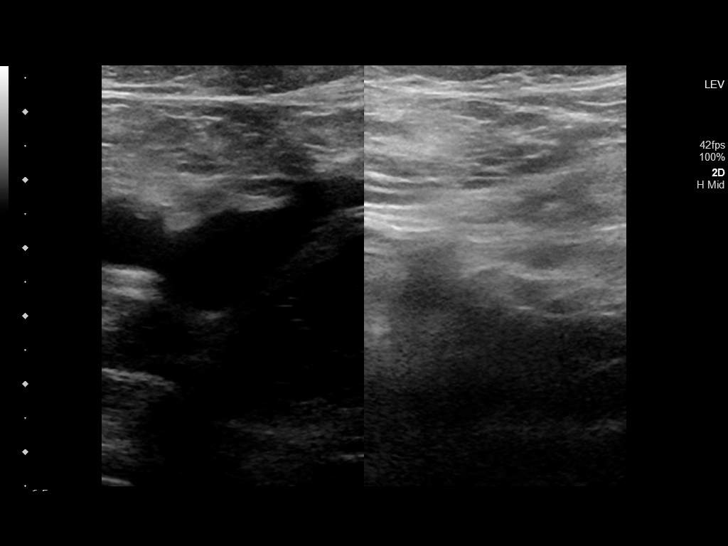
[im 11/64]
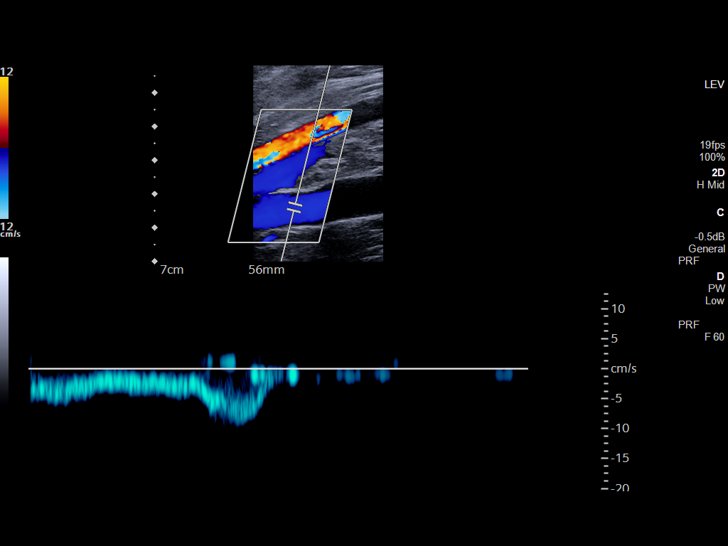
[im 17/64]
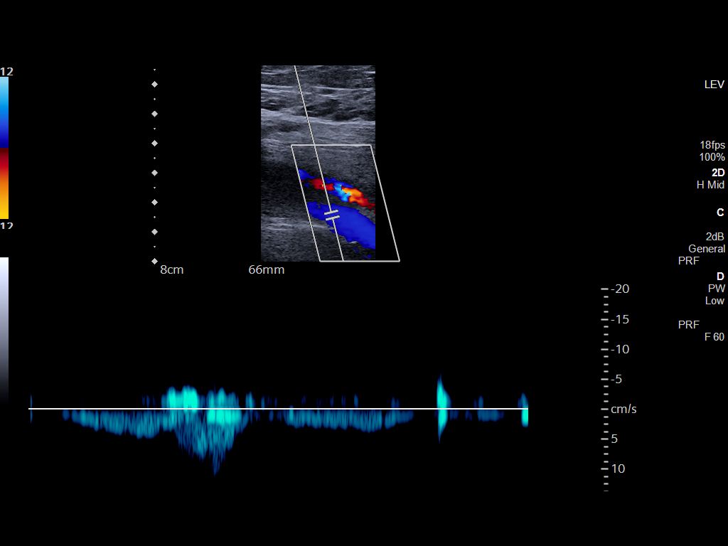
[im 20/64]
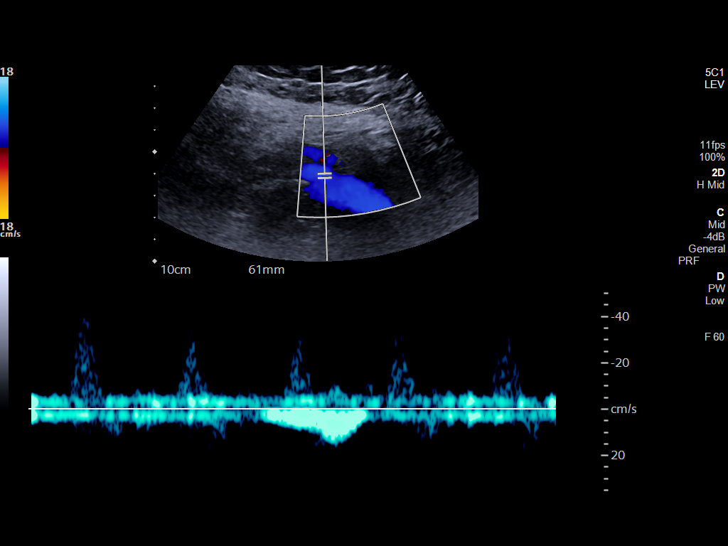
[im 25/64]
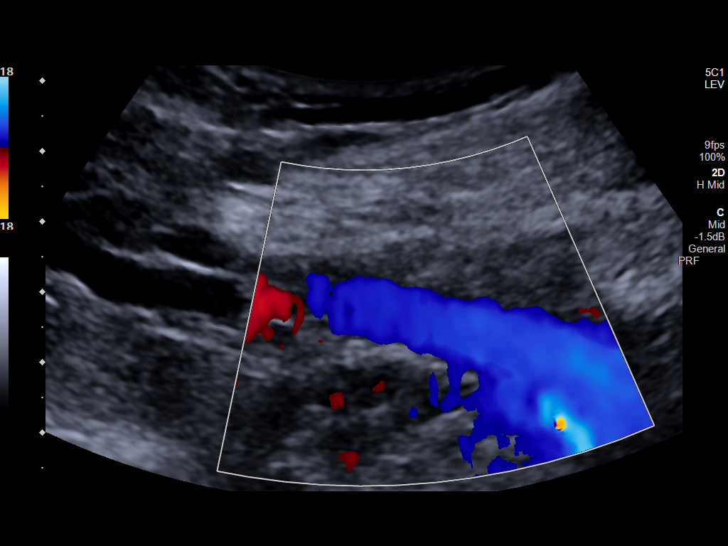
[im 31/64]
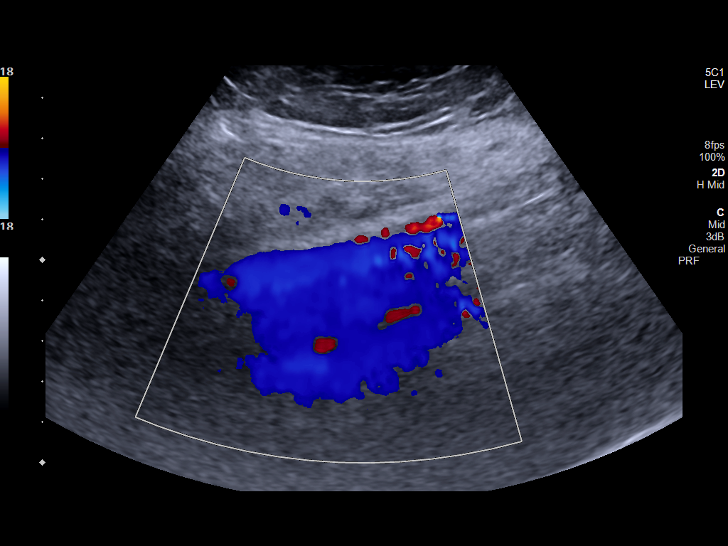
[im 33/64]
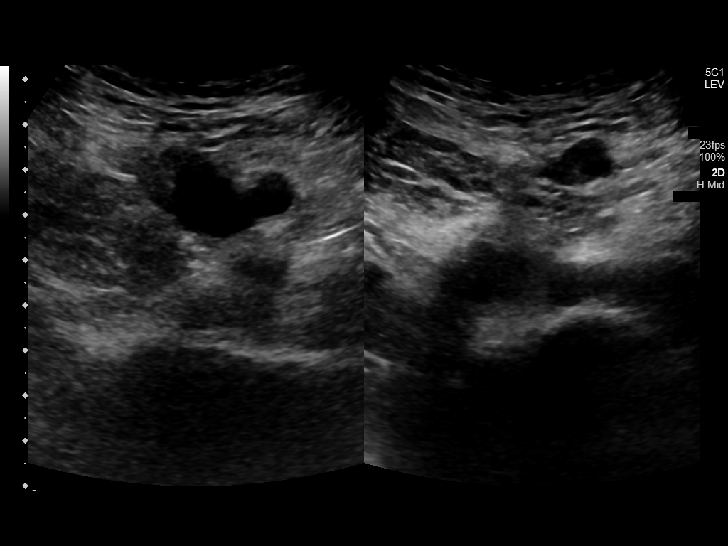
[im 39/64]
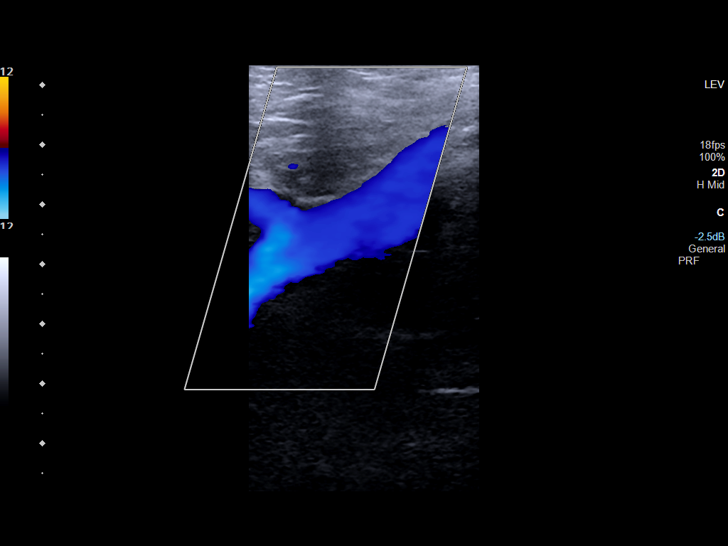
[im 44/64]
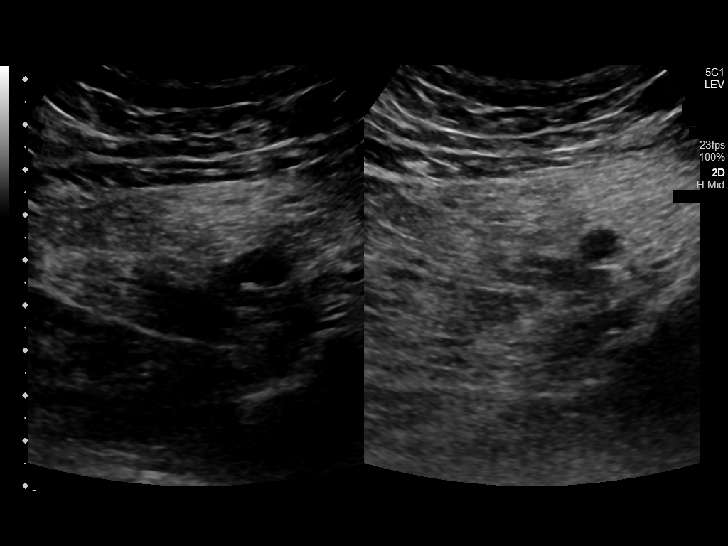
[im 50/64]
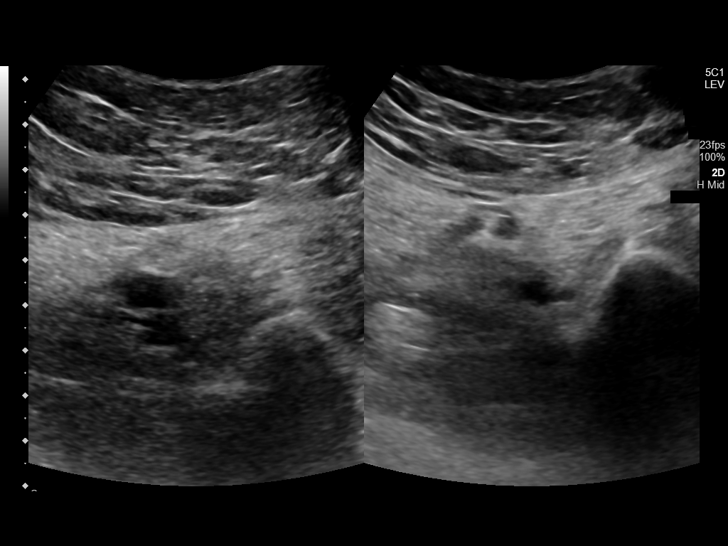
[im 53/64]
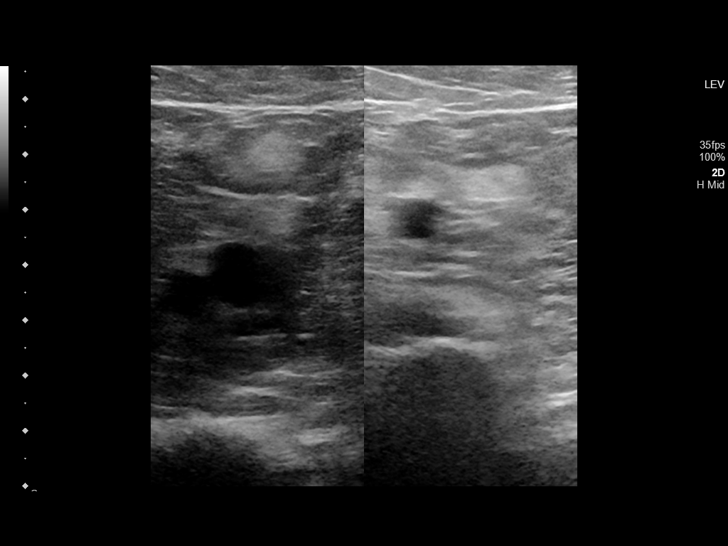
[im 58/64]
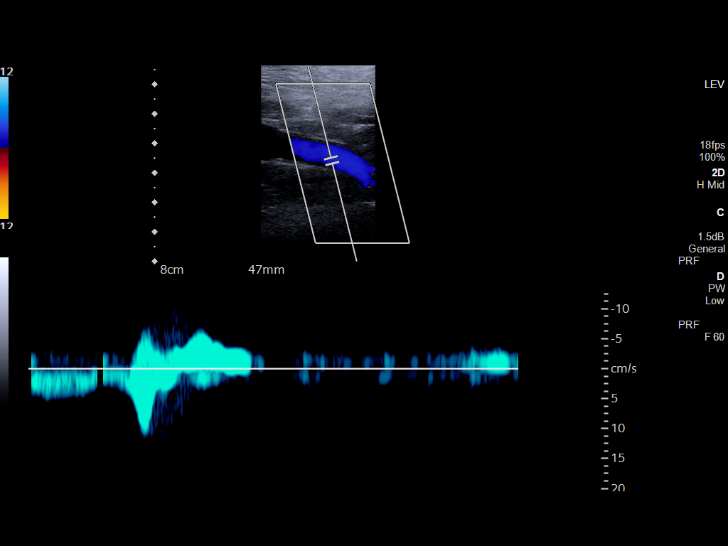
[im 64/64]
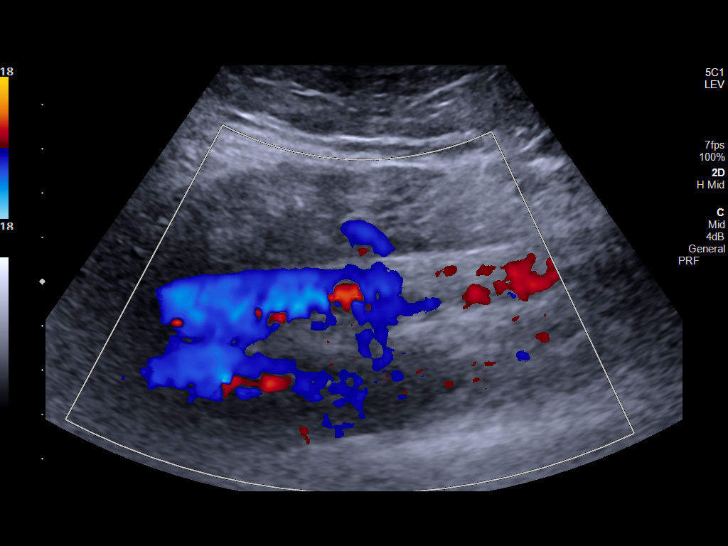

[14 of 24 positions shown; findings below may reference images not displayed]

FINDINGS: VENOUS

Normal compressibility of the common femoral, superficial femoral,
and popliteal veins, as well as the visualized calf veins.
Visualized portions of profunda femoral vein and great saphenous
vein unremarkable. No filling defects to suggest DVT on grayscale or
color Doppler imaging. Doppler waveforms show normal direction of
venous flow, normal respiratory plasticity and response to
augmentation.

Limited views of the contralateral common femoral vein are
unremarkable.

OTHER

None.

Limitations: none
IMPRESSION: Negative.

## 2021-05-21 MED ORDER — ORPHENADRINE CITRATE 30 MG/ML IJ SOLN
60.0000 mg | Freq: Two times a day (BID) | INTRAMUSCULAR | Status: DC
  Administered 2021-05-21: 60 mg via INTRAMUSCULAR
  Filled 2021-05-21: qty 2

## 2021-05-21 MED ORDER — LOSARTAN POTASSIUM-HCTZ 100-25 MG PO TABS: 1.0000 | ORAL_TABLET | Freq: Every day | ORAL | 0 refills | Status: DC

## 2021-05-21 MED ORDER — FUROSEMIDE 20 MG PO TABS: 20.0000 mg | ORAL_TABLET | Freq: Every day | ORAL | 11 refills | Status: DC

## 2021-05-21 NOTE — ED Provider Triage Note (Signed)
Emergency Medicine Provider Triage Evaluation Note  [name] , a 51 y.o. female  was evaluated in triage.  Pt complains of bilateral lower extremity swelling and right knee pain.  Patient states that her blood pressure has been elevated at home.  She reports that she has recently been laid off of her job and has been unable to take her blood pressure medication.  She has been sitting more than usual.  She denies chest pain, chest tightness or shortness of breath.  Review of Systems  Positive: Patient has leg swelling.  Negative: No chest pain, chest tightness or shortness of breath.   Physical Exam  Ht 5\' 10"  (1.778 m)    Wt (!) 140.6 kg    BMI 44.48 kg/m  Gen:   Awake, no distress   Resp:  Normal effort  MSK:   Moves extremities without difficulty    Medical Decision Making  Medically screening exam initiated at 5:51 PM.  Appropriate orders placed.  ASHANTEE DEUPREE was informed that the remainder of the evaluation will be completed by another provider, this initial triage assessment does not replace that evaluation, and the importance of remaining in the ED until their evaluation is complete.     Art Buff Caballo, [place] 05/21/21 1753

## 2021-05-21 NOTE — ED Triage Notes (Signed)
Pt to ED via POV. Pt states that she has been having issues with her blood pressure being high and her ankles are swelling. Pt states that she fell 3 weeks ago and again on Friday. Pt states that on Friday her knee gave out and she fell. Pt is having pain in both hip and the right leg. Pt is in NAD.

## 2021-05-21 NOTE — ED Provider Notes (Signed)
[facility] Provider Note    Event Date/Time   First MD Initiated Contact with Patient 05/21/21 1833     (approximate)   History   Blood Pressure Check and Edema   HPI  [name] is a 51 y.o. female who presents for evaluation of elevated blood pressure and lower extremity edema.  Patient reports she had been prescribed on Lasix months ago but has not been taking it.  She has chronic hip and knee pain which makes it difficult for her to get to the bathroom.  Has seen orthopedist for this.  Reports mixed compliance with her blood pressure medication.  She thinks it is worsened since she had a fall over the last 3 weeks.  No chest pain, no shortness of breath     Physical Exam   Triage Vital Signs: ED Triage Vitals  Enc Vitals Group     BP 05/21/21 1752 (!) 184/106     Pulse Rate 05/21/21 1752 (!) 103     Resp 05/21/21 1752 16     Temp 05/21/21 1752 98.2 F (36.8 C)     Temp Source 05/21/21 1752 Oral     SpO2 05/21/21 1752 95 %     Weight 05/21/21 1750 (!) 140.6 kg (310 lb)     Height 05/21/21 1750 1.778 m (5\' 10" )     Head Circumference --      Peak Flow --      Pain Score 05/21/21 1750 2     Pain Loc --      Pain Edu? --      Excl. in GC? --     Most recent vital signs: Vitals:   05/21/21 1752  BP: (!) 184/106  Pulse: (!) 103  Resp: 16  Temp: 98.2 F (36.8 C)  SpO2: 95%     General: Awake, no distress.  CV:  Good peripheral perfusion.  Resp:  Normal effort.  No rales Abd:  No distention.  Other:  Lower extremities: 1+ edema bilaterally up to the level of the knee.   ED Results / Procedures / Treatments   Labs (all labs ordered are listed, but only abnormal results are displayed) Labs Reviewed  CBC WITH DIFFERENTIAL/PLATELET - Abnormal; Notable for the following components:      Result Value   WBC 11.8 (*)    Neutro Abs 8.4 (*)    All other components within normal limits  COMPREHENSIVE METABOLIC PANEL - Abnormal;  Notable for the following components:   Glucose, Bld 123 (*)    All other components within normal limits  URINALYSIS, COMPLETE (UACMP) WITH MICROSCOPIC     EKG     RADIOLOGY Reviewed ultrasound of the lower extremities, no evidence of DVT    PROCEDURES:  Critical Care performed:   Procedures   MEDICATIONS ORDERED IN ED: Medications  orphenadrine (NORFLEX) injection 60 mg (has no administration in time range)     IMPRESSION / MDM / ASSESSMENT AND PLAN / ED COURSE  I reviewed the triage vital signs and the nursing notes.   Patient presents with concerns for elevated blood pressure, lower extremity edema.  She is not having any chest pain or shortness of breath.  It sounds like she has not been on her blood pressure medication as prescribed, will refill.  Also she has not been on her Lasix either, will refill this.  We will give a dose of IM Norflex here in the emergency department to help with her  muscle pain in her legs.  Will refer to pain management.  Patient's labs reviewed and are quite reassuring, CBC, CMP are normal, glucose is normal  Appropriate for discharge with close outpatient follow-up for blood pressure recheck.         FINAL CLINICAL IMPRESSION(S) / ED DIAGNOSES   Final diagnoses:  Peripheral edema  Hypertension, unspecified type     Rx / DC Orders   ED Discharge Orders          Ordered    losartan-hydrochlorothiazide (HYZAAR) 100-25 MG tablet  Daily        05/21/21 1930    furosemide (LASIX) 20 MG tablet  Daily        05/21/21 1930             Note:  This document was prepared using Dragon voice recognition software and may include unintentional dictation errors.   Jene Every, MD 05/21/21 Barry Brunner

## 2021-05-21 NOTE — ED Triage Notes (Signed)
Pt called from [facility] to treatment room, no response 

## 2021-07-04 ENCOUNTER — Other Ambulatory Visit (HOSPITAL_COMMUNITY): Payer: Self-pay | Admitting: Orthopedic Surgery

## 2021-07-04 DIAGNOSIS — M25551 Pain in right hip: Secondary | ICD-10-CM

## 2021-07-04 DIAGNOSIS — M25561 Pain in right knee: Secondary | ICD-10-CM

## 2021-07-04 DIAGNOSIS — M25552 Pain in left hip: Secondary | ICD-10-CM

## 2021-08-07 ENCOUNTER — Encounter (HOSPITAL_COMMUNITY): Payer: Self-pay | Admitting: *Deleted

## 2021-08-07 ENCOUNTER — Other Ambulatory Visit: Payer: Self-pay

## 2021-08-07 NOTE — Progress Notes (Addendum)
Spoke with pt for pre-op call. Pt denies cardiac history and Diabetes. She is treated for HTN. Pt states she does not check her BP at home. Pt states she is not able to take the Lasix because it makes her urinate and she can't get to the bathroom fast because of her knee pain. She states she is taking the Hyzaar. Pt is on Suboxone because she has a hx of substance abuse and doesn't want to fall back into that.  ? ?Chart sent to Anesthesia PA. ?

## 2021-08-07 NOTE — Anesthesia Preprocedure Evaluation (Addendum)
Anesthesia Evaluation  ?Patient identified by MRN, date of birth, ID band ?Patient awake ? ? ? ?Reviewed: ?Allergy & Precautions, NPO status , Patient's Chart, lab work & pertinent test results ? ?History of Anesthesia Complications ?Negative for: history of anesthetic complications ? ?Airway ?Mallampati: III ? ?TM Distance: >3 FB ?Neck ROM: Full ? ? ? Dental ? ?(+) Edentulous Upper, Edentulous Lower ?  ?Pulmonary ?Current Smoker,  ?  ?Pulmonary exam normal ? ? ? ? ? ? ? Cardiovascular ?hypertension, Pt. on medications ?Normal cardiovascular exam ? ? ?  ?Neuro/Psych ?Anxiety Depression negative neurological ROS ?   ? GI/Hepatic ?GERD  ,(+)  ?  ? substance abuse (suboxone) ? ,   ?Endo/Other  ?Morbid obesity ? Renal/GU ?negative Renal ROS  ?negative genitourinary ?  ?Musculoskeletal ? ? Abdominal ?  ?Peds ? Hematology ?negative hematology ROS ?(+)   ?Anesthesia Other Findings ? ? Reproductive/Obstetrics ? ?  ? ? ? ? ? ? ? ? ? ? ? ? ? ?  ?  ? ? ? ? ? ?Anesthesia Physical ?Anesthesia Plan ? ?ASA: 3 ? ?Anesthesia Plan: General  ? ?Post-op Pain Management: Minimal or no pain anticipated  ? ?Induction: Intravenous ? ?PONV Risk Score and Plan: 2 and Ondansetron, Dexamethasone, Midazolam and Treatment may vary due to age or medical condition ? ?Airway Management Planned: LMA and Oral ETT ? ?Additional Equipment: None ? ?Intra-op Plan:  ? ?Post-operative Plan: Extubation in OR ? ?Informed Consent: I have reviewed the patients History and Physical, chart, labs and discussed the procedure including the risks, benefits and alternatives for the proposed anesthesia with the patient or authorized representative who has indicated his/her understanding and acceptance.  ? ? ? ?Dental advisory given ? ?Plan Discussed with:  ? ?Anesthesia Plan Comments:   ? ? ? ? ?Anesthesia Quick Evaluation ? ?

## 2021-08-08 ENCOUNTER — Encounter (HOSPITAL_COMMUNITY): Admission: RE | Disposition: A | Discharge status: Home / Self Care | Payer: Self-pay | Source: Home / Self Care

## 2021-08-08 ENCOUNTER — Ambulatory Visit (HOSPITAL_COMMUNITY)
Admission: RE | Admit: 2021-08-08 | Discharge: 2021-08-08 | Disposition: A | Discharge status: Home / Self Care | Payer: Medicaid Other | Source: Ambulatory Visit | Attending: Orthopedic Surgery | Admitting: Orthopedic Surgery

## 2021-08-08 ENCOUNTER — Encounter (HOSPITAL_COMMUNITY): Payer: Self-pay

## 2021-08-08 ENCOUNTER — Other Ambulatory Visit: Payer: Self-pay

## 2021-08-08 ENCOUNTER — Ambulatory Visit (HOSPITAL_COMMUNITY): Payer: Medicaid Other | Admitting: Certified Registered Nurse Anesthetist

## 2021-08-08 ENCOUNTER — Ambulatory Visit (HOSPITAL_COMMUNITY): Payer: Medicaid Other

## 2021-08-08 ENCOUNTER — Ambulatory Visit (HOSPITAL_BASED_OUTPATIENT_CLINIC_OR_DEPARTMENT_OTHER): Payer: Medicaid Other | Admitting: Certified Registered Nurse Anesthetist

## 2021-08-08 ENCOUNTER — Ambulatory Visit (HOSPITAL_COMMUNITY)
Admission: RE | Admit: 2021-08-08 | Discharge: 2021-08-08 | Disposition: A | Discharge status: Home / Self Care | Payer: Medicaid Other | Attending: Orthopedic Surgery | Admitting: Orthopedic Surgery

## 2021-08-08 DIAGNOSIS — M25561 Pain in right knee: Secondary | ICD-10-CM | POA: Diagnosis not present

## 2021-08-08 DIAGNOSIS — M25551 Pain in right hip: Secondary | ICD-10-CM | POA: Diagnosis not present

## 2021-08-08 DIAGNOSIS — M25552 Pain in left hip: Secondary | ICD-10-CM

## 2021-08-08 DIAGNOSIS — M1711 Unilateral primary osteoarthritis, right knee: Secondary | ICD-10-CM | POA: Insufficient documentation

## 2021-08-08 DIAGNOSIS — I10 Essential (primary) hypertension: Secondary | ICD-10-CM | POA: Diagnosis not present

## 2021-08-08 DIAGNOSIS — F17210 Nicotine dependence, cigarettes, uncomplicated: Secondary | ICD-10-CM

## 2021-08-08 DIAGNOSIS — M160 Bilateral primary osteoarthritis of hip: Secondary | ICD-10-CM | POA: Diagnosis not present

## 2021-08-08 HISTORY — DX: Depression, unspecified: F32.A

## 2021-08-08 HISTORY — DX: Other complications of anesthesia, initial encounter: T88.59XA

## 2021-08-08 HISTORY — DX: Anxiety disorder, unspecified: F41.9

## 2021-08-08 HISTORY — PX: RADIOLOGY WITH ANESTHESIA: SHX6223

## 2021-08-08 IMAGING — MR MR KNEE*R* W/O CM
4 of 7 series · 10 of 40 positions shown · non-contrast
Comparison: None available. Report only from remote radiographs
[DATE].

CLINICAL DATA: Bilateral hip and right knee pain since falling 5
months ago.

EXAM:
MRI OF THE RIGHT KNEE WITHOUT CONTRAST
TECHNIQUE: Multiplanar, multisequence MR imaging of the knee was performed. No
intravenous contrast was administered.

[Series 3: T2 fat-sat · axial · 4.0mm · 0.31mm/px · 1 of 34 slices shown]
[im 5/34]
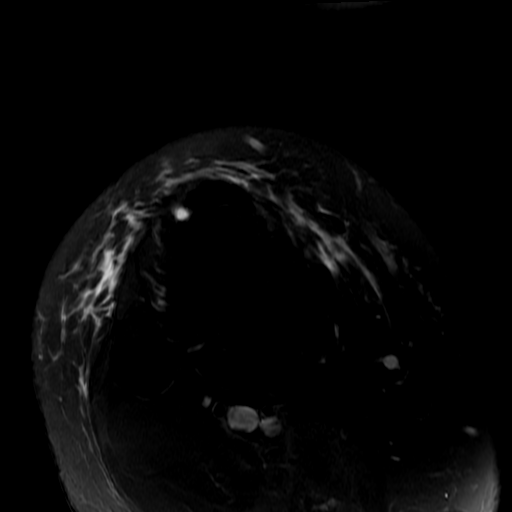

[Series 5: PD fat-sat · oblique · 4.0mm · 0.16mm/px · 3 of 23 slices shown (1 of 3)]
[im 1/23]
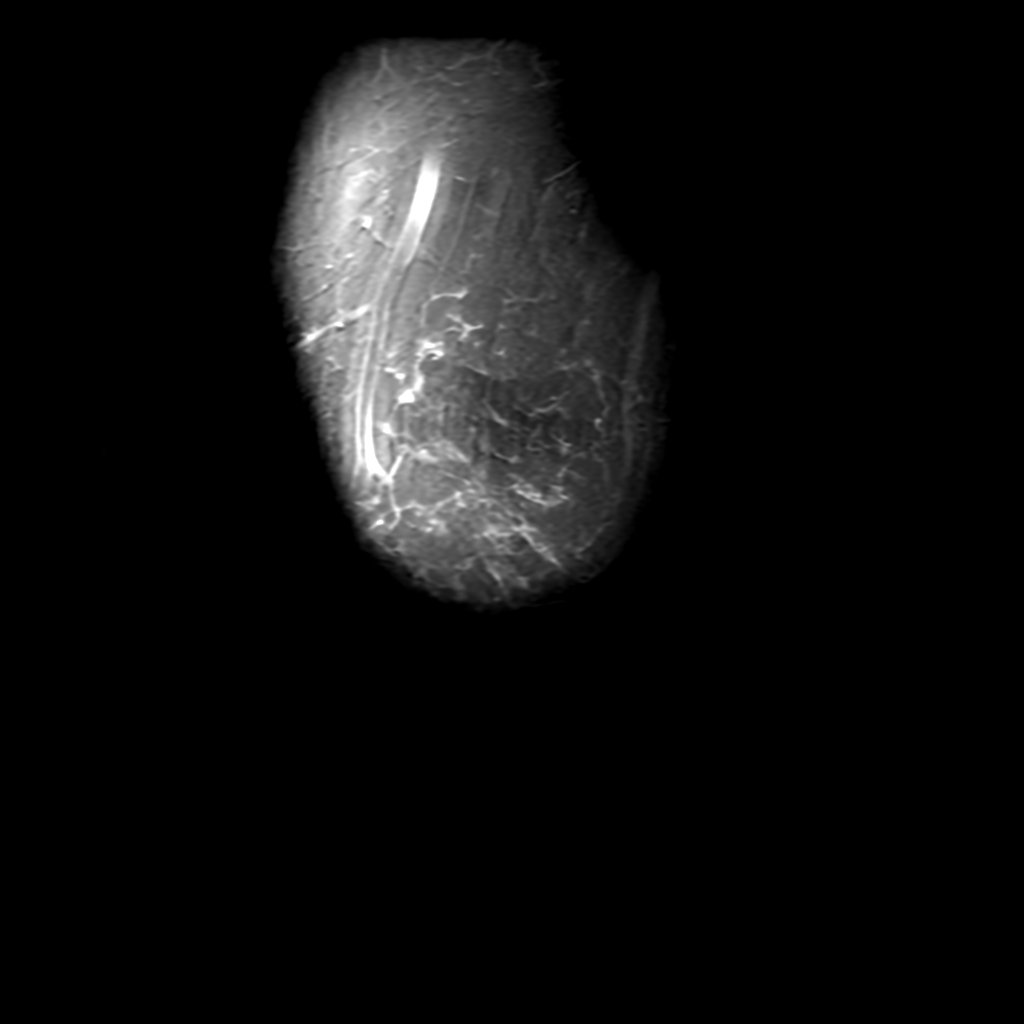
[im 12/23]
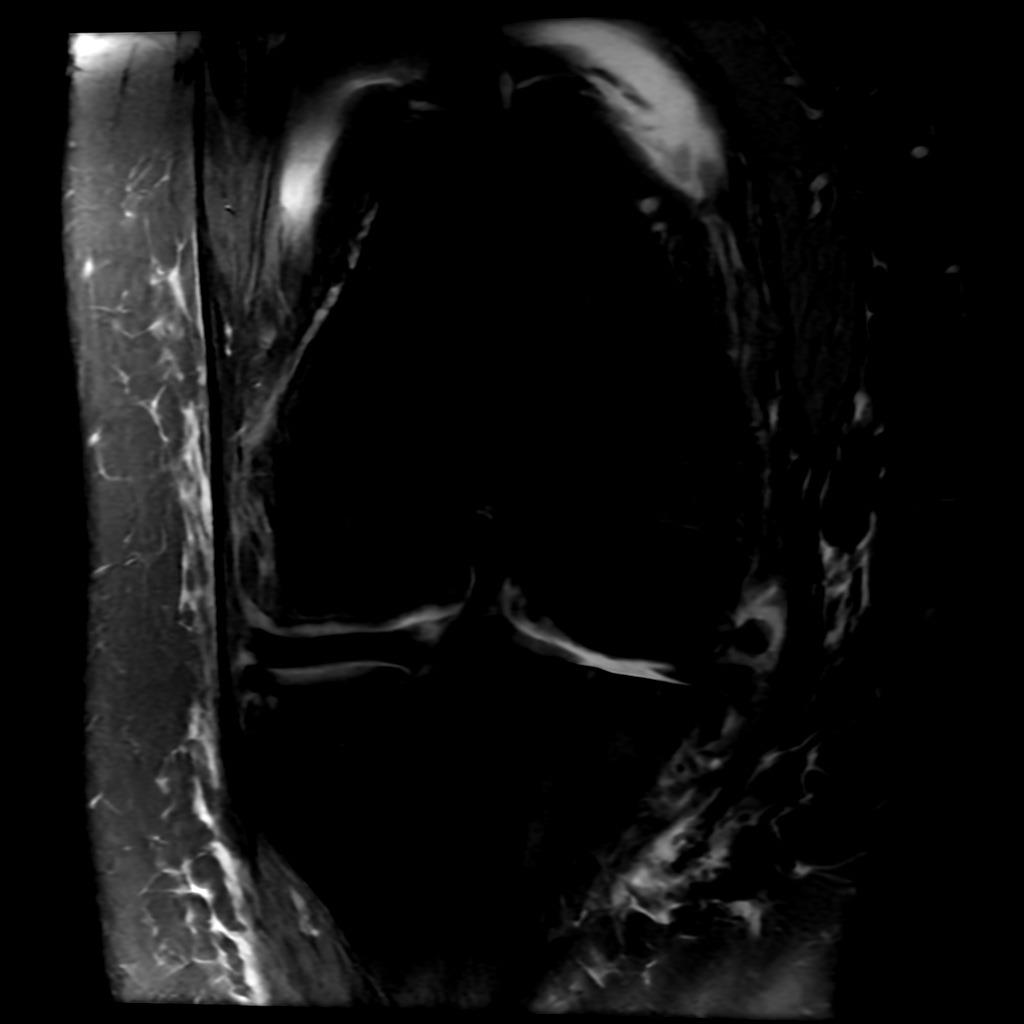
[im 23/23]
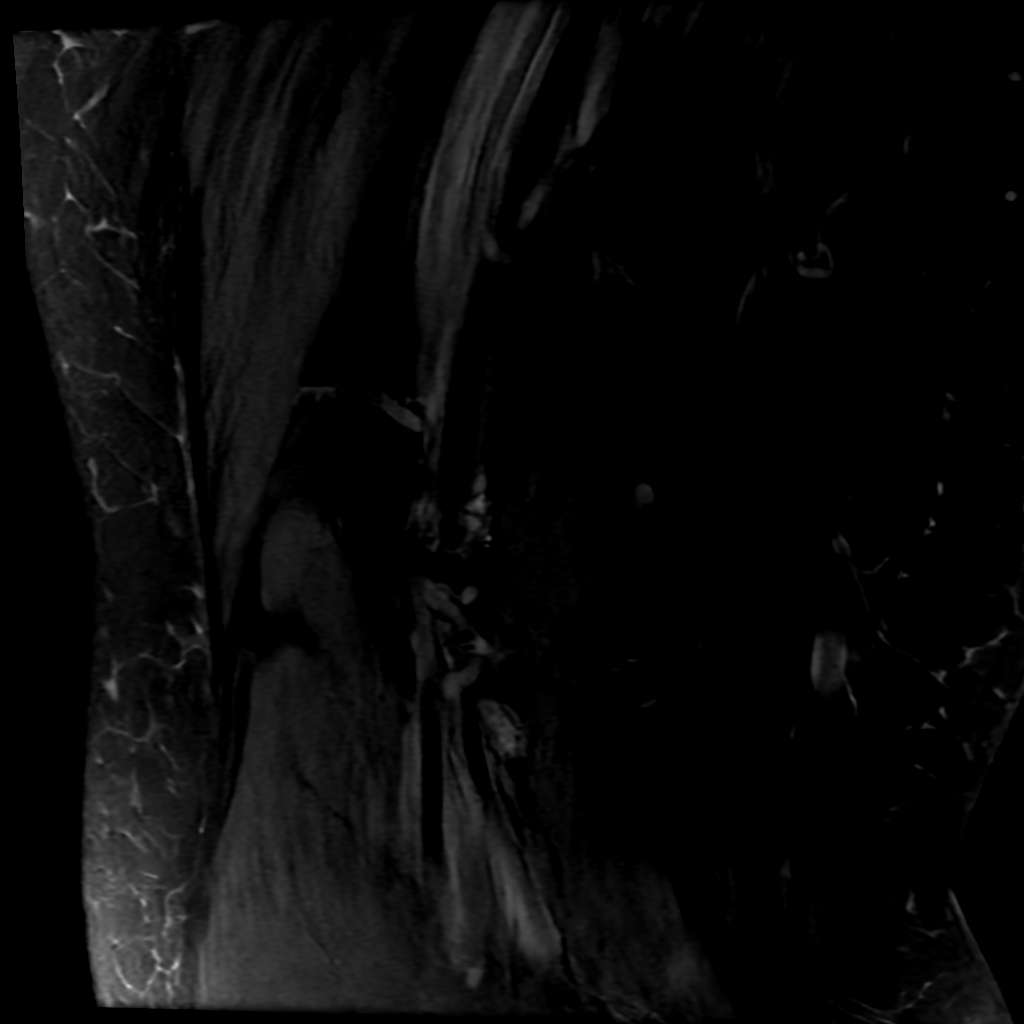

[Series 6: PD fat-sat · sagittal · 3.0mm · 0.29mm/px · 3 of 32 slices shown (2 of 3)]
[im 6/32]
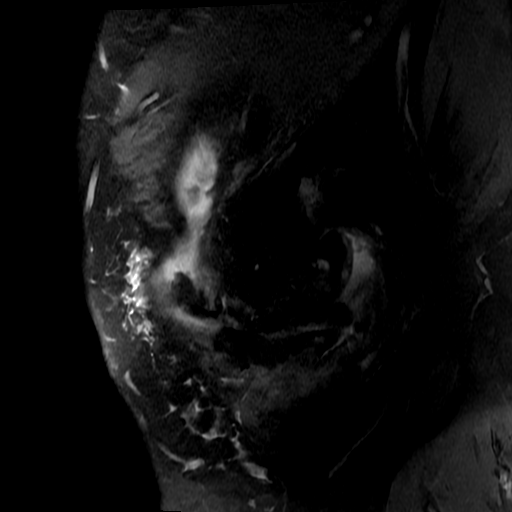
[im 16/32]
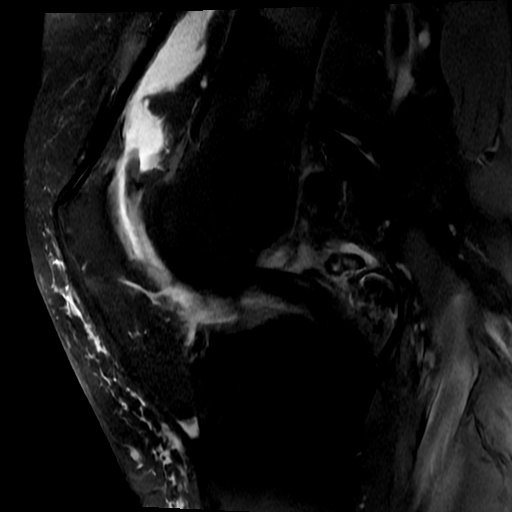
[im 26/32]
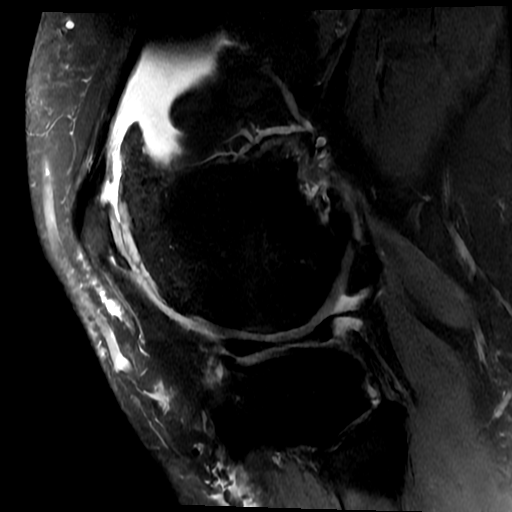

[Series 9: PD fat-sat · oblique · 2.0mm · 0.35mm/px · 3 of 15 slices shown (3 of 3)]
[im 1/15]
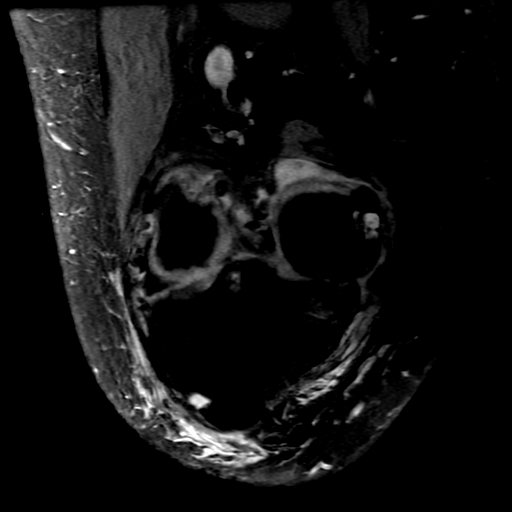
[im 8/15]
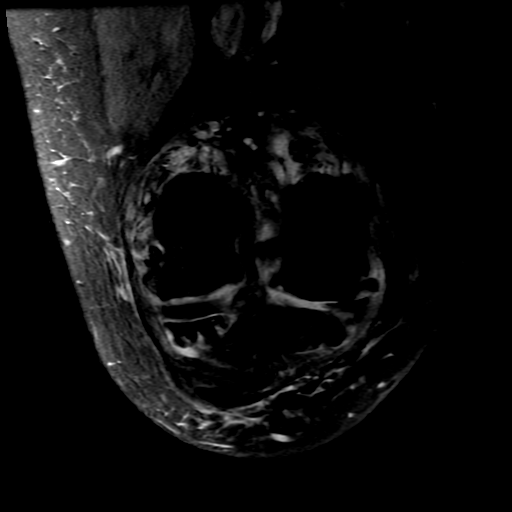
[im 15/15]
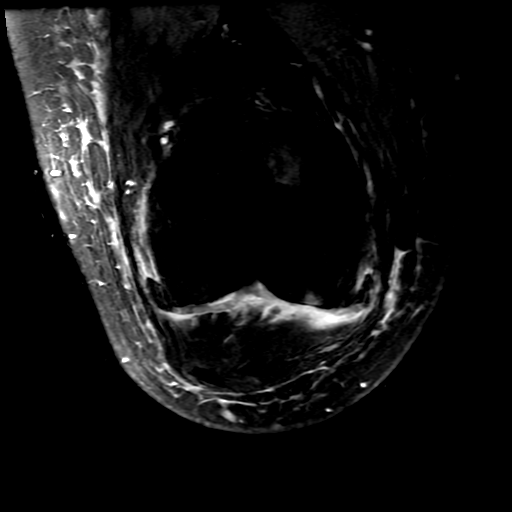

[10 of 40 positions shown; findings below may reference images not displayed]

FINDINGS: MENISCI

Medial meniscus: Diffusely degenerated and largely extruded
peripherally from the joint. There is diffuse free edge fraying and
mild attenuation of the meniscal root, although no discrete radial
tear or centrally displaced meniscal fragments identified.

Lateral meniscus:  Intact with normal morphology.

LIGAMENTS

Cruciates:  Intact.

Collaterals: Intact. There is moderate medial buckling of the MCL
related to the medial meniscal extrusion.

CARTILAGE

Patellofemoral: Age advanced patellofemoral degenerative changes
with chondral thinning, surface irregularity and large
patellofemoral osteophytes.

Medial: Age advanced degenerative changes with diffuse chondral
thinning, subchondral eburnation and large peripheral osteophytes.

Lateral: Age advanced degenerative changes with chondral thinning,
surface irregularity and peripheral osteophyte formation. Near
full-thickness chondral defect involving the central aspect of the
lateral femoral condyle.

MISCELLANEOUS

Joint: Moderate-sized joint effusion. Posterior intra-articular
loose bodies and fragmented osteophytes.

Popliteal Fossa: The popliteus muscle and tendon are intact. No
significant Baker's cyst.

Extensor Mechanism:  Intact.

Bones:  No acute or significant extra-articular osseous findings.

Other: Mild prepatellar subcutaneous edema.
IMPRESSION: 1. Age advanced tricompartmental osteoarthritis with large
osteophytes, a joint effusion and multiple intra-articular loose
bodies. No acute osseous findings identified.
2. The medial meniscus is diffusely degenerated and largely extruded
peripherally from the joint.
3. The lateral meniscus, cruciate and collateral ligaments are
intact.

## 2021-08-08 IMAGING — MR MR HIP*L* W/O CM
4 of 8 series · 19 of 40 positions shown · non-contrast
Comparison: None.

CLINICAL DATA: Patient fell in [REDACTED] and injured right knee.
Bilateral hip pain

EXAM:
MR OF THE LEFT HIP WITHOUT CONTRAST
TECHNIQUE: Multiplanar, multisequence MR imaging was performed. No intravenous
contrast was administered.

[Series 2: T2 fat-sat · coronal · 4.0mm · 0.70mm/px · 5 of 30 slices shown (1 of 2)]
[im 1/30]
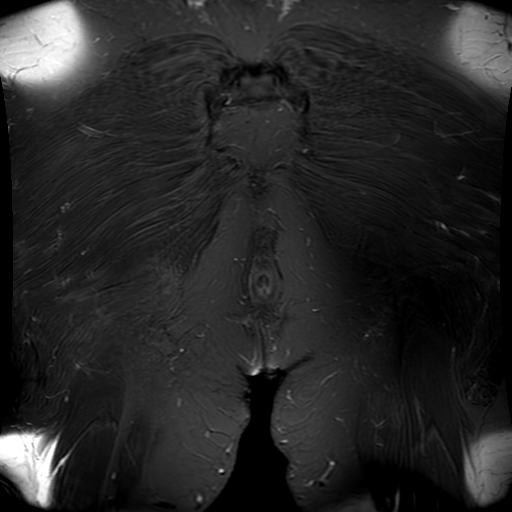
[im 8/30]
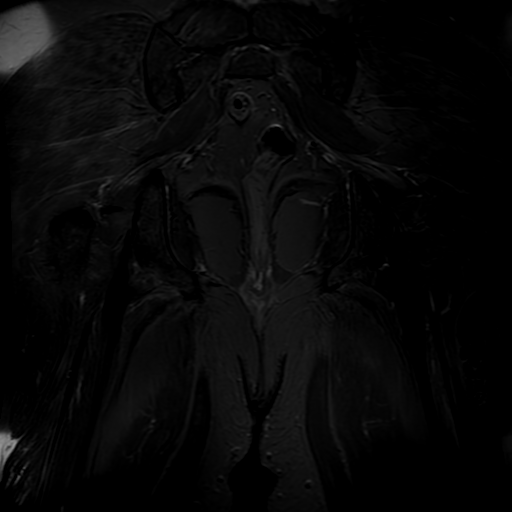
[im 15/30]
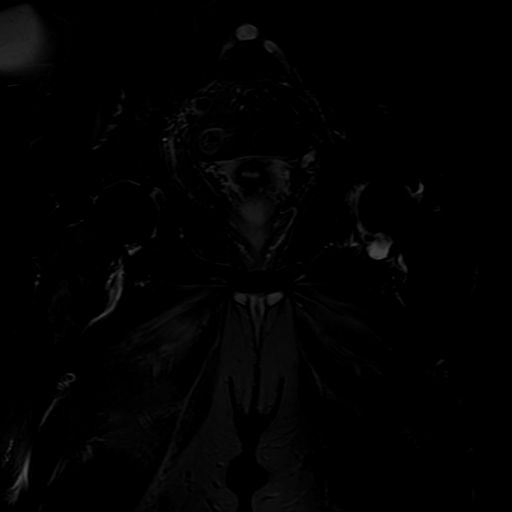
[im 22/30]
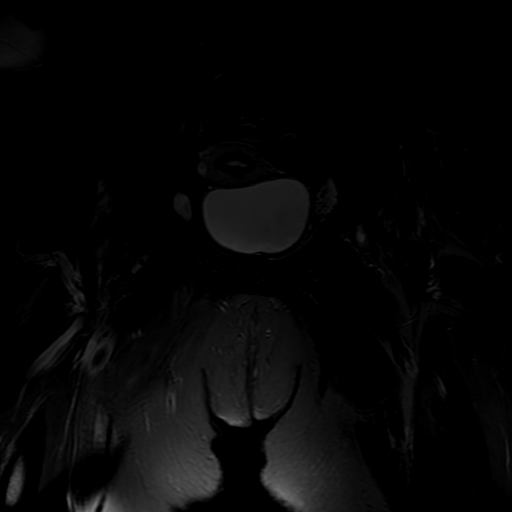
[im 30/30]
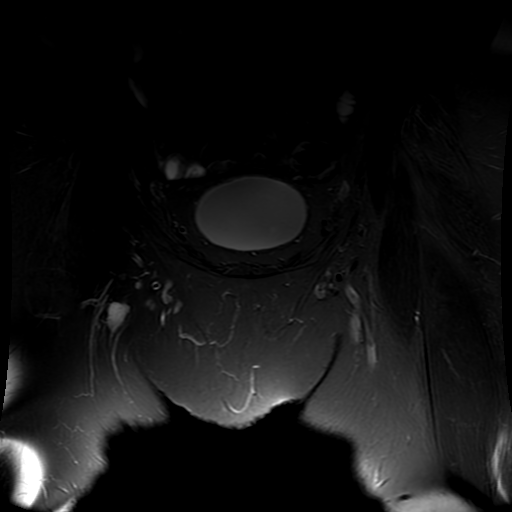

[Series 4: T2 fat-sat · axial · 4.0mm · 0.70mm/px · z∈[-39,+106]mm · 4 of 30 slices shown (2 of 2)]
[im 1/30]
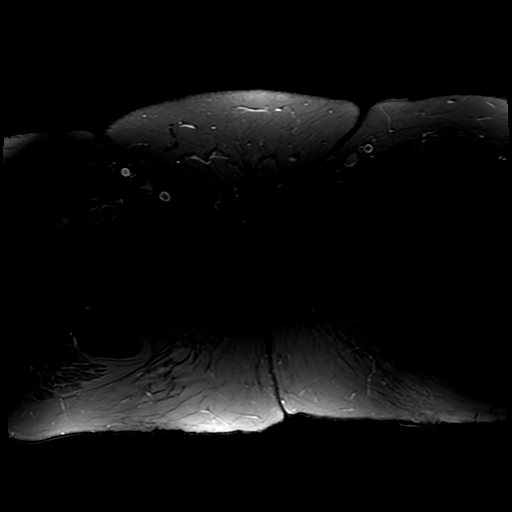
[im 8/30]
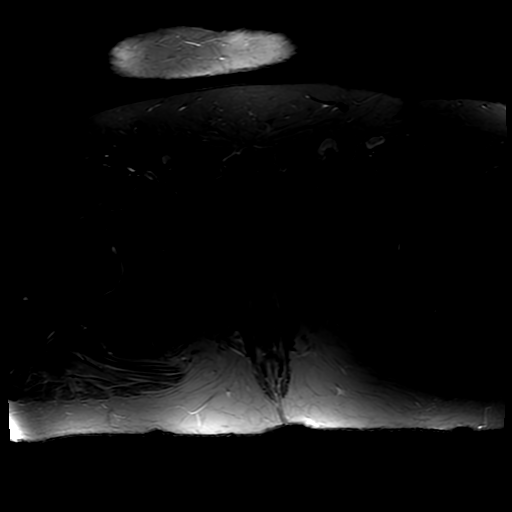
[im 15/30]
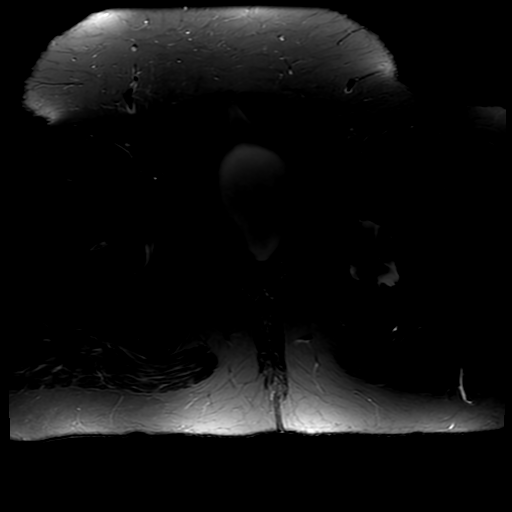
[im 30/30]
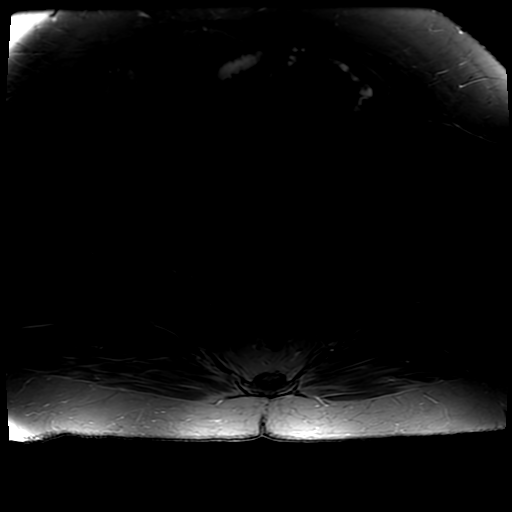

[Series 6: PD fat-sat · sagittal · 4.0mm · 0.35mm/px · 5 of 33 slices shown (1 of 2)]
[im 1/33]
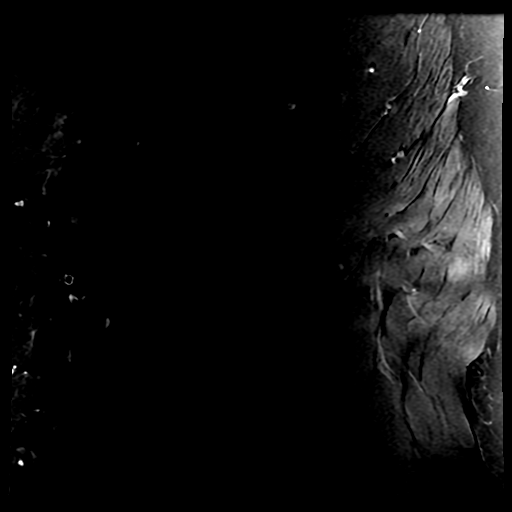
[im 9/33]
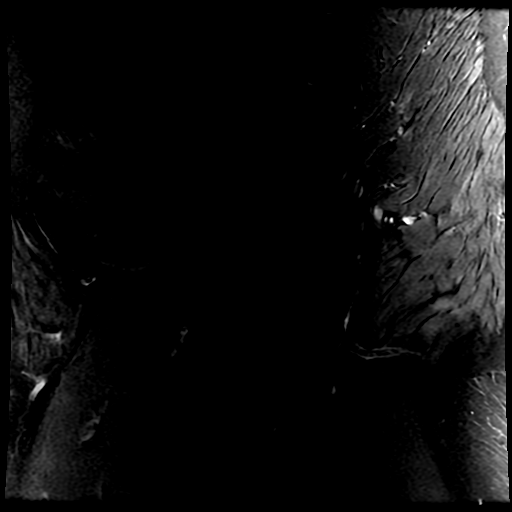
[im 17/33]
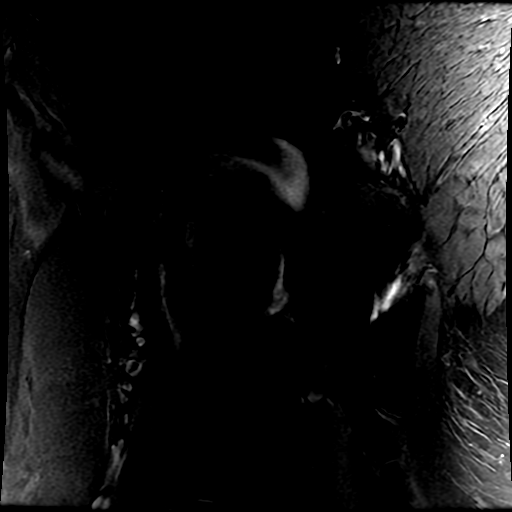
[im 25/33]
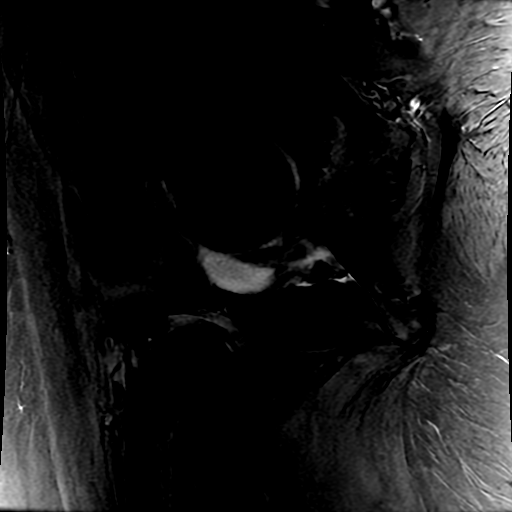
[im 33/33]
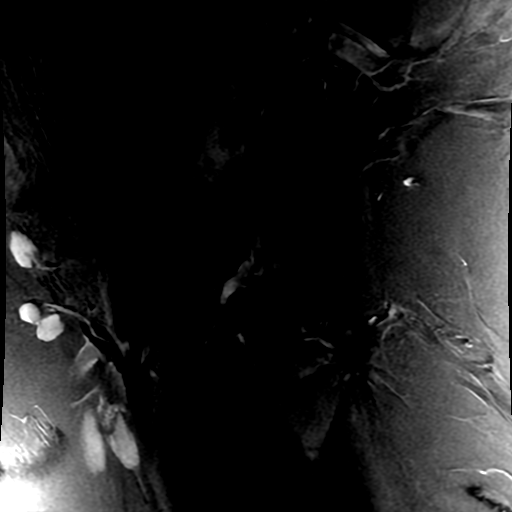

[Series 9: PD fat-sat · coronal · 4.0mm · 0.35mm/px · 5 of 33 slices shown (2 of 2)]
[im 1/33]
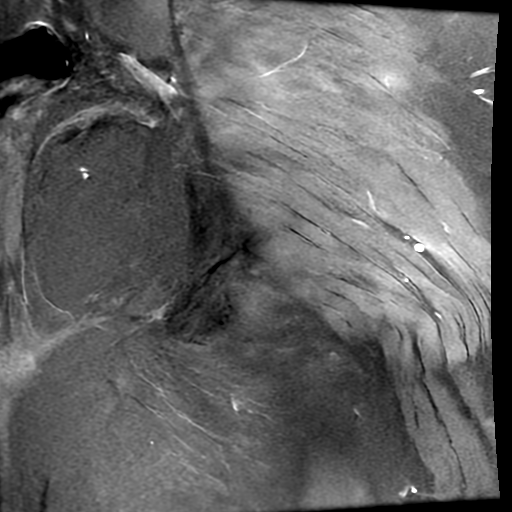
[im 9/33]
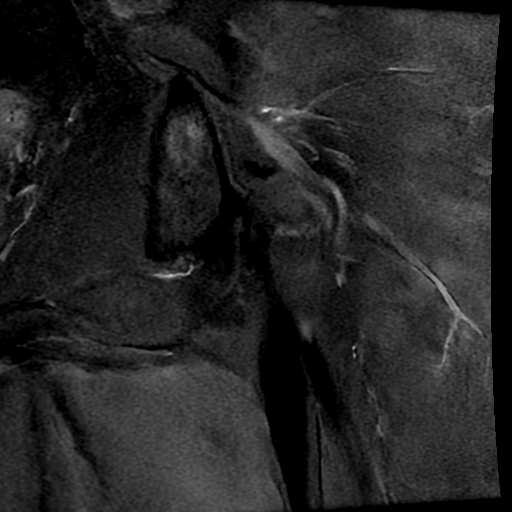
[im 17/33]
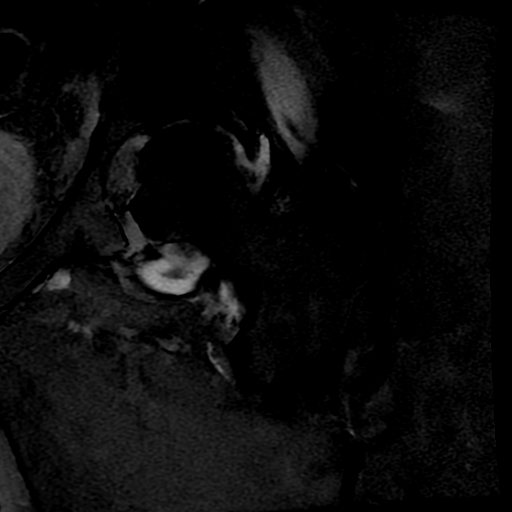
[im 25/33]
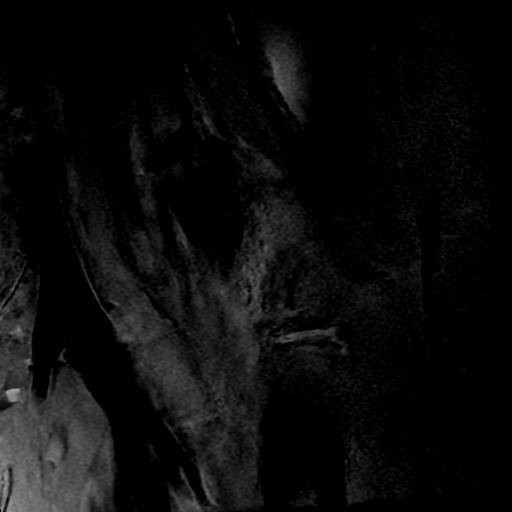
[im 33/33]
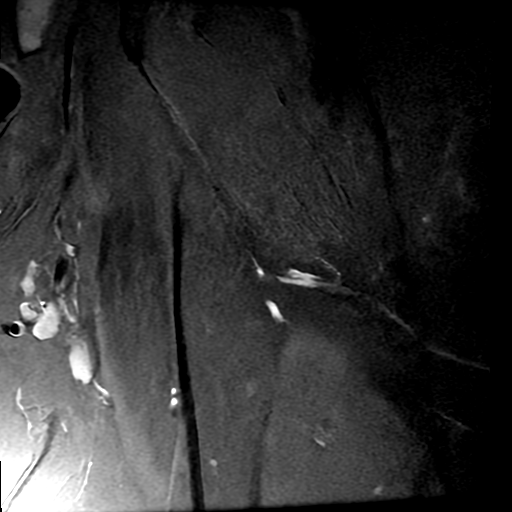

[19 of 40 positions shown; findings below may reference images not displayed]

FINDINGS: Bone

No hip fracture, dislocation or avascular necrosis. No aggressive
osseous lesion.

SI joints are normal. No SI joint widening or erosive changes.

Alignment

Normal. No subluxation.

Joint effusion

Small left hip joint effusion.

Labrum

Degenerative changes of the labrum without discrete tear.

Cartilage

Generalized articular cartilage thinning prominent in the
anterosuperior compartment.

Capsule and ligaments

Normal.

Muscles and Tendons

Flexors: Normal.

Extensors: Normal.

Abductors: Normal.

Adductors: Normal.

Rotators: Normal.

Hamstrings: Small amount of fluid at the left hamstring origin
concerning for tendinopathy.

Other Findings

No bursal fluid.

Viscera

No abnormality seen in pelvis. No lymphadenopathy. No free fluid in
the pelvis.
IMPRESSION: 1.  No evidence of fracture or dislocation.

2.  Mild left hip osteoarthritis with small joint effusion.

3.  Mild tendinopathy at the left hamstring origin.

## 2021-08-08 SURGERY — MRI WITH ANESTHESIA
Anesthesia: General | Laterality: Bilateral

## 2021-08-08 MED ORDER — KETOROLAC TROMETHAMINE 30 MG/ML IJ SOLN
INTRAMUSCULAR | Status: AC
Start: 2021-08-08 — End: 2021-08-08
  Filled 2021-08-08: qty 1

## 2021-08-08 MED ORDER — LACTATED RINGERS IV SOLN: INTRAVENOUS | Status: DC | PRN

## 2021-08-08 MED ORDER — ONDANSETRON HCL 4 MG/2ML IJ SOLN
INTRAMUSCULAR | Status: DC | PRN
Start: 2021-08-08 — End: 2021-08-08
  Administered 2021-08-08: 4 mg via INTRAVENOUS

## 2021-08-08 MED ORDER — PHENYLEPHRINE HCL-NACL 20-0.9 MG/250ML-% IV SOLN
INTRAVENOUS | Status: DC | PRN
Start: 2021-08-08 — End: 2021-08-08
  Administered 2021-08-08: 30 ug/min via INTRAVENOUS

## 2021-08-08 MED ORDER — LIDOCAINE 2% (20 MG/ML) 5 ML SYRINGE
INTRAMUSCULAR | Status: DC | PRN
  Administered 2021-08-08: 100 mg via INTRAVENOUS

## 2021-08-08 MED ORDER — ORAL CARE MOUTH RINSE: 15.0000 mL | Freq: Once | OROMUCOSAL | Status: AC

## 2021-08-08 MED ORDER — DEXAMETHASONE SODIUM PHOSPHATE 10 MG/ML IJ SOLN
INTRAMUSCULAR | Status: DC | PRN
Start: 2021-08-08 — End: 2021-08-08
  Administered 2021-08-08: 10 mg via INTRAVENOUS

## 2021-08-08 MED ORDER — LACTATED RINGERS IV SOLN: INTRAVENOUS | Status: DC

## 2021-08-08 MED ORDER — LABETALOL HCL 5 MG/ML IV SOLN
INTRAVENOUS | Status: AC
  Administered 2021-08-08: 10 mg via INTRAVENOUS
  Filled 2021-08-08: qty 4

## 2021-08-08 MED ORDER — SUGAMMADEX SODIUM 200 MG/2ML IV SOLN
INTRAVENOUS | Status: DC | PRN
  Administered 2021-08-08: 300 mg via INTRAVENOUS

## 2021-08-08 MED ORDER — CHLORHEXIDINE GLUCONATE 0.12 % MT SOLN
15.0000 mL | Freq: Once | OROMUCOSAL | Status: AC
  Administered 2021-08-08: 15 mL via OROMUCOSAL
  Filled 2021-08-08: qty 15

## 2021-08-08 MED ORDER — KETAMINE HCL 50 MG/5ML IJ SOSY
PREFILLED_SYRINGE | INTRAMUSCULAR | Status: AC
  Filled 2021-08-08: qty 5

## 2021-08-08 MED ORDER — MIDAZOLAM HCL 5 MG/5ML IJ SOLN
INTRAMUSCULAR | Status: DC | PRN
  Administered 2021-08-08: 2 mg via INTRAVENOUS

## 2021-08-08 MED ORDER — ROCURONIUM BROMIDE 100 MG/10ML IV SOLN
INTRAVENOUS | Status: DC | PRN
  Administered 2021-08-08: 100 mg via INTRAVENOUS

## 2021-08-08 MED ORDER — PROPOFOL 10 MG/ML IV BOLUS
INTRAVENOUS | Status: DC | PRN
  Administered 2021-08-08: 200 mg via INTRAVENOUS
  Administered 2021-08-08: 50 mg via INTRAVENOUS

## 2021-08-08 MED ORDER — LABETALOL HCL 5 MG/ML IV SOLN: 10.0000 mg | Freq: Once | INTRAVENOUS | Status: AC

## 2021-08-08 MED ORDER — FENTANYL CITRATE (PF) 100 MCG/2ML IJ SOLN
INTRAMUSCULAR | Status: DC | PRN
  Administered 2021-08-08 (×2): 50 ug via INTRAVENOUS

## 2021-08-08 MED ORDER — LABETALOL HCL 5 MG/ML IV SOLN
INTRAVENOUS | Status: DC | PRN
  Administered 2021-08-08: 10 mg via INTRAVENOUS

## 2021-08-08 MED ORDER — KETOROLAC TROMETHAMINE 30 MG/ML IJ SOLN
INTRAMUSCULAR | Status: DC | PRN
  Administered 2021-08-08: 30 mg via INTRAVENOUS

## 2021-08-08 NOTE — Anesthesia Procedure Notes (Signed)
Procedure Name: Intubation ?Date/Time: 08/08/2021 8:36 AM ?Performed by: Gwyndolyn Saxon, CRNA ?Pre-anesthesia Checklist: Patient identified, Emergency Drugs available, Suction available, Patient being monitored and Timeout performed ?Patient Re-evaluated:Patient Re-evaluated prior to induction ?Oxygen Delivery Method: Circle system utilized ?Preoxygenation: Pre-oxygenation with 100% oxygen ?Induction Type: IV induction ?Ventilation: Mask ventilation without difficulty ?Laryngoscope Size: Sabra Heck and 2 ?Grade View: Grade I ?Tube type: Oral ?Tube size: 7.0 mm ?Number of attempts: 1 ?Airway Equipment and Method: Patient positioned with wedge pillow and Stylet ?Placement Confirmation: ETT inserted through vocal cords under direct vision, positive ETCO2 and breath sounds checked- equal and bilateral ?Secured at: 21 cm ?Tube secured with: Tape ?Dental Injury: Teeth and Oropharynx as per pre-operative assessment  ? ? ? ? ?

## 2021-08-08 NOTE — Anesthesia Postprocedure Evaluation (Signed)
Anesthesia Post Note ? ?Patient: [name] ? ?Procedure(s) Performed: MRI WITH BILATERAL HIPWITHOUT CONTRAST,RIGHT  KNEE WITHOUT CONTRAST (Bilateral) ? ?  ? ?Patient location during evaluation: PACU ?Anesthesia Type: General ?Level of consciousness: awake and alert ?Pain management: pain level controlled ?Vital Signs Assessment: post-procedure vital signs reviewed and stable ?Respiratory status: spontaneous breathing, nonlabored ventilation and respiratory function stable ?Cardiovascular status: blood pressure returned to baseline and stable ?Postop Assessment: no apparent nausea or vomiting ?Anesthetic complications: no ? ? ?No notable events documented. ? ?Last Vitals:  ?Vitals:  ? 08/08/21 0736 08/08/21 1015  ?BP: (!) 169/104 (!) 147/94  ?Pulse: 84 95  ?Resp:  17  ?Temp:  (!) 36.1 ?C  ?SpO2:  91%  ?  ?Last Pain:  ?Vitals:  ? 08/08/21 1015  ?TempSrc:   ?PainSc: 10-Worst pain ever  ? ? ?  ?  ?  ?  ?  ?  ? ?Lidia Collum ? ? ? ? ?

## 2021-08-08 NOTE — Transfer of Care (Signed)
Immediate Anesthesia Transfer of Care Note ? ?Patient: [name] ? ?Procedure(s) Performed: MRI WITH BILATERAL HIPWITHOUT CONTRAST,RIGHT  KNEE WITHOUT CONTRAST (Bilateral) ? ?Patient Location: PACU ? ?Anesthesia Type:General ? ?Level of Consciousness: awake, alert  and oriented ? ?Airway & Oxygen Therapy: Patient Spontanous Breathing ? ?Post-op Assessment: Report given to RN and Post -op Vital signs reviewed and stable ? ?Post vital signs: Reviewed and stable ? ?Last Vitals:  ?Vitals Value Taken Time  ?BP    ?Temp    ?Pulse 79 08/08/21 1032  ?Resp    ?SpO2 97 % 08/08/21 1032  ?Vitals shown include unvalidated device data. ? ?Last Pain:  ?Vitals:  ? 08/08/21 1015  ?TempSrc:   ?PainSc: 10-Worst pain ever  ?   ? ?Patients Stated Pain Goal: 3 (08/08/21 4196) ? ?Complications: No notable events documented. ?

## 2021-08-09 ENCOUNTER — Encounter (HOSPITAL_COMMUNITY): Payer: Self-pay | Admitting: Radiology

## 2022-06-04 ENCOUNTER — Emergency Department: Payer: Medicaid Other

## 2022-06-04 ENCOUNTER — Other Ambulatory Visit: Payer: Self-pay

## 2022-06-04 ENCOUNTER — Emergency Department
Admission: EM | Admit: 2022-06-04 | Discharge: 2022-06-04 | Disposition: A | Discharge status: Home / Self Care | Payer: Medicaid Other | Attending: Emergency Medicine | Admitting: Emergency Medicine

## 2022-06-04 ENCOUNTER — Other Ambulatory Visit: Payer: Medicaid Other

## 2022-06-04 ENCOUNTER — Encounter: Payer: Self-pay | Admitting: Emergency Medicine

## 2022-06-04 DIAGNOSIS — R2689 Other abnormalities of gait and mobility: Secondary | ICD-10-CM | POA: Diagnosis present

## 2022-06-04 DIAGNOSIS — W06XXXA Fall from bed, initial encounter: Secondary | ICD-10-CM | POA: Insufficient documentation

## 2022-06-04 DIAGNOSIS — S99912A Unspecified injury of left ankle, initial encounter: Secondary | ICD-10-CM | POA: Diagnosis not present

## 2022-06-04 DIAGNOSIS — W19XXXA Unspecified fall, initial encounter: Secondary | ICD-10-CM

## 2022-06-04 DIAGNOSIS — I10 Essential (primary) hypertension: Secondary | ICD-10-CM | POA: Insufficient documentation

## 2022-06-04 NOTE — ED Notes (Signed)
See triage note. Pt states she fell while moving out of her bedroom. Denies hitting head or LoC. States she is unable to bear weight or ambulate on her own. She moves around her home by 'scooting on my office chair".

## 2022-06-04 NOTE — ED Notes (Signed)
Called [facility] for transport back home

## 2022-06-04 NOTE — ED Provider Notes (Signed)
   [facility] Provider Note    Event Date/Time   First MD Initiated Contact with Patient 06/04/22 1953     (approximate)   History   Fall (/)   HPI  [name] is a 52 y.o. female with arthritis, hypertension, anxiety who presents after a fall.  Patient reports she slid from her bed, she thinks she may have injured her ankle.  She reports she is unable to ambulate at baseline, no other injuries reported.     Physical Exam   Triage Vital Signs: ED Triage Vitals  Enc Vitals Group     BP 06/04/22 1824 93/82     Pulse Rate 06/04/22 1824 (!) 116     Resp 06/04/22 1824 18     Temp 06/04/22 1824 98 F (36.7 C)     Temp Source 06/04/22 1824 Oral     SpO2 06/04/22 1824 97 %     Weight --      Height --      Head Circumference --      Peak Flow --      Pain Score 06/04/22 1823 1     Pain Loc --      Pain Edu? --      Excl. in [place]? --     Most recent vital signs: Vitals:   06/04/22 1824  BP: 93/82  Pulse: (!) 116  Resp: 18  Temp: 98 F (36.7 C)  SpO2: 97%     General: Awake, no distress.  CV:  Good peripheral perfusion.  Resp:  Normal effort.  Abd:  No distention.  Other:  Left ankle: Mild swelling along the lateral malleolus   ED Results / Procedures / Treatments   Labs (all labs ordered are listed, but only abnormal results are displayed) Labs Reviewed - No data to display   EKG     RADIOLOGY Left ankle x-ray viewed interpret by me, no fracture    PROCEDURES:  Critical Care performed:   Procedures   MEDICATIONS ORDERED IN ED: Medications - No data to display   IMPRESSION / MDM / [place] / ED COURSE  I reviewed the triage vital signs and the nursing notes. Patient's presentation is most consistent with acute complicated illness / injury requiring diagnostic workup.  Patient presents after a fall with ankle injury.  Exam is overall reassuring, mild swelling along the lateral malleolus x-ray  negative for fracture.  No indication for admission at this time, appropriate for discharge with outpatient follow-up.        FINAL CLINICAL IMPRESSION(S) / ED DIAGNOSES   Final diagnoses:  Fall, initial encounter  Ankle injury, left, initial encounter     Rx / DC Orders   ED Discharge Orders     None        Note:  This document was prepared using Dragon voice recognition software and may include unintentional dictation errors.   Lavonia Drafts, MD 06/04/22 2145

## 2022-06-04 NOTE — ED Triage Notes (Signed)
First nurse note: Pt to ED via [facility] from home. Pt reports decreased mobility x4 months. Pt needs to have right knee surgery but MD stating pt has to lose weight first. Pt reports increased pain.

## 2022-06-04 NOTE — ED Triage Notes (Signed)
Patient to ED after a fall. Patient states she fell moving from chair to bed. States she does not walk. C/o right hip pain.

## 2023-02-05 ENCOUNTER — Emergency Department
Admission: EM | Admit: 2023-02-05 | Discharge: 2023-02-05 | Disposition: A | Discharge status: Home / Self Care | Payer: Medicaid Other | Attending: Emergency Medicine | Admitting: Emergency Medicine

## 2023-02-05 ENCOUNTER — Other Ambulatory Visit: Payer: Self-pay

## 2023-02-05 DIAGNOSIS — H61013 Acute perichondritis of external ear, bilateral: Secondary | ICD-10-CM | POA: Insufficient documentation

## 2023-02-05 DIAGNOSIS — H61003 Unspecified perichondritis of external ear, bilateral: Secondary | ICD-10-CM

## 2023-02-05 LAB — CBC WITH DIFFERENTIAL/PLATELET
Abs Immature Granulocytes: 0.04 10*3/uL (ref 0.00–0.07)
Basophils Absolute: 0.1 10*3/uL (ref 0.0–0.1)
Basophils Relative: 1 %
Eosinophils Absolute: 0.2 10*3/uL (ref 0.0–0.5)
Eosinophils Relative: 2 %
HCT: 46.4 % — ABNORMAL HIGH (ref 36.0–46.0)
Hemoglobin: 15 g/dL (ref 12.0–15.0)
Immature Granulocytes: 0 %
Lymphocytes Relative: 20 %
Lymphs Abs: 2.1 10*3/uL (ref 0.7–4.0)
MCH: 29.4 pg (ref 26.0–34.0)
MCHC: 32.3 g/dL (ref 30.0–36.0)
MCV: 91 fL (ref 80.0–100.0)
Monocytes Absolute: 0.6 10*3/uL (ref 0.1–1.0)
Monocytes Relative: 6 %
Neutro Abs: 7.4 10*3/uL (ref 1.7–7.7)
Neutrophils Relative %: 71 %
Platelets: 374 10*3/uL (ref 150–400)
RBC: 5.1 MIL/uL (ref 3.87–5.11)
RDW: 13.8 % (ref 11.5–15.5)
WBC: 10.5 10*3/uL (ref 4.0–10.5)
nRBC: 0 % (ref 0.0–0.2)

## 2023-02-05 LAB — BASIC METABOLIC PANEL
Anion gap: 10 (ref 5–15)
BUN: 13 mg/dL (ref 6–20)
CO2: 24 mmol/L (ref 22–32)
Calcium: 9.2 mg/dL (ref 8.9–10.3)
Chloride: 100 mmol/L (ref 98–111)
Creatinine, Ser: 0.84 mg/dL (ref 0.44–1.00)
GFR, Estimated: >60 mL/min (ref >60)
Glucose, Bld: 102 mg/dL — ABNORMAL HIGH (ref 70–99)
Potassium: 4.1 mmol/L (ref 3.5–5.1)
Sodium: 134 mmol/L — ABNORMAL LOW (ref 135–145)

## 2023-02-05 MED ORDER — PREDNISONE 10 MG (21) PO TBPK: ORAL_TABLET | ORAL | 0 refills | Status: DC

## 2023-02-05 MED ORDER — DEXAMETHASONE SODIUM PHOSPHATE 10 MG/ML IJ SOLN
10.0000 mg | Freq: Once | INTRAMUSCULAR | Status: AC
  Administered 2023-02-05: 10 mg via INTRAVENOUS
  Filled 2023-02-05: qty 1

## 2023-02-05 MED ORDER — SODIUM CHLORIDE 0.9 % IV SOLN
3.0000 g | Freq: Once | INTRAVENOUS | Status: AC
  Administered 2023-02-05: 3 g via INTRAVENOUS
  Filled 2023-02-05: qty 8

## 2023-02-05 NOTE — Discharge Instructions (Addendum)
Your case was discussed with Dr. Okey Dupre, the ear nose and throat doctor on-call.  He recommended that you start the medication that was prescribed.  Please also follow-up in his clinic on Monday or Tuesday of this coming week.  Please call his office to make this appointment.  Please return for any new, worsening, or change in symptoms or other concerns.

## 2023-02-05 NOTE — ED Triage Notes (Signed)
Pt to ED For bilateral ear infection. States has been on antibiotics. States needs a different antibiotic.

## 2023-02-05 NOTE — ED Provider Notes (Signed)
[facility] Provider Note    Event Date/Time   First MD Initiated Contact with Patient 02/05/23 1106     (approximate)   History   Otalgia   HPI  [name] is a 52 y.o. female with a PMH of obesity who presents today for evaluation of bilateral ear infection.  Patient last took ciprofloxacin on 01/31/2023.  Patient reports that this for started approximately 1 week month ago.  She reports that she wears headphones while she works at home and thinks that this is what started it.  Patient reports that she has been on 3 rounds of 1 week of ciprofloxacin and has not noticed any changes.  She continues to have swelling to her pinna and her ear canal with discharge from her ear canal.  She has not had any headaches.  She reports that she has congestion.  No fevers or chills.  She reports that she is not diabetic.  Patient Active Problem List   Diagnosis Date Noted   Cholecystitis with cholelithiasis 02/25/2013          Physical Exam   Triage Vital Signs: ED Triage Vitals  Encounter Vitals Group     BP 02/05/23 1036 (!) 160/109     Systolic BP Percentile --      Diastolic BP Percentile --      Pulse Rate 02/05/23 1034 (!) 116     Resp 02/05/23 1034 18     Temp 02/05/23 1034 98.3 F (36.8 C)     Temp src --      SpO2 02/05/23 1034 95 %     Weight 02/05/23 1035 300 lb (136.1 kg)     Height 02/05/23 1035 5\' 10"  (1.778 m)     Head Circumference --      Peak Flow --      Pain Score 02/05/23 1035 4     Pain Loc --      Pain Education --      Exclude from Growth Chart --     Most recent vital signs: Vitals:   02/05/23 1036 02/05/23 1307  BP: (!) 160/109 (!) 148/94  Pulse:  (!) 102  Resp:  18  Temp:  98.3 F (36.8 C)  SpO2:  95%    Physical Exam Vitals and nursing note reviewed.  Constitutional:      General: Awake and alert. No acute distress.    Appearance: Normal appearance. The patient is obese.  HENT:     Head: Normocephalic and  atraumatic.     Mouth: Mucous membranes are moist.  Bilateral pinna with erythema and diffuse swelling with diffuse canal swelling, without otorrhea. TM partially visualized due to canal swelling Eyes:     General: PERRL. Normal EOMs        Right eye: No discharge.        Left eye: No discharge.     Conjunctiva/sclera: Conjunctivae normal.  Cardiovascular:     Rate and Rhythm: Normal rate and regular rhythm.     Pulses: Normal pulses.  Pulmonary:     Effort: Pulmonary effort is normal. No respiratory distress.     Breath sounds: Normal breath sounds.  Abdominal:     Abdomen is soft. There is no abdominal tenderness. No rebound or guarding. No distention. Musculoskeletal:        General: No swelling. Normal range of motion.     Cervical back: Normal range of motion and neck supple.  Skin:  General: Skin is warm and dry.     Capillary Refill: Capillary refill takes less than 2 seconds.     Findings: No rash.  Neurological:     Mental Status: The patient is awake and alert.       ED Results / Procedures / Treatments   Labs (all labs ordered are listed, but only abnormal results are displayed) Labs Reviewed  CBC WITH DIFFERENTIAL/PLATELET - Abnormal; Notable for the following components:      Result Value   HCT 46.4 (*)    All other components within normal limits  BASIC METABOLIC PANEL - Abnormal; Notable for the following components:   Sodium 134 (*)    Glucose, Bld 102 (*)    All other components within normal limits     EKG     RADIOLOGY     PROCEDURES:  Critical Care performed:   Procedures   MEDICATIONS ORDERED IN ED: Medications  Ampicillin-Sulbactam (UNASYN) 3 g in sodium chloride 0.9 % 100 mL IVPB (0 g Intravenous Stopped 02/05/23 1323)  dexamethasone (DECADRON) injection 10 mg (10 mg Intravenous Given 02/05/23 1210)     IMPRESSION / MDM / ASSESSMENT AND PLAN / ED COURSE  I reviewed the triage vital signs and the nursing  notes.   Differential diagnosis includes, but is not limited to, perichondritis, chondritis, malignant otitis externa, otitis externa, otitis media.  Patient is awake and alert, tachycardic on arrival to 116 though normotensive and afebrile.  She has swelling and erythema of her bilateral pinnas, sparing the earlobe, the right is greater than left.  There is no mastoid tenderness or erythema.  There is no otorrhea within the canal, though the canal is swollen.  No history of diabetes to suggest malignant otitis externa.    I reviewed the patient's chart.  No previous visits for this problem in her chart.   Labs obtained are overall reassuring. No leukocytosis, no hyperglycemia. I discussed with Dr. Jenean Lindau with ENT on-call who feels that this is consistent with either a relapsing polychondritis or a contact dermatitis and recommends 1 dose of IV Unasyn, 10 mg of IV Decadron, and discharged on a prednisone taper.  He agrees to see the patient next week on Monday or Tuesday.  Patient is in agreement with this plan.  She was discharged in stable condition.   Patient's presentation is most consistent with acute complicated illness / injury requiring diagnostic workup.   Clinical Course as of 02/05/23 1401  Tue Feb 05, 2023  1202 Discussed with Dr. Okey Dupre who feels this could either be a dermatitis or relapsing polychondritis. Recommends 10mg  IV decadron, steroid taper, IV unasyn. And followup Monday/Tuesday [JP]  1204 [phone] Monday/tuesday [JP]    Clinical Course User Index [JP] Guage Efferson, Herb Grays, PA-C     FINAL CLINICAL IMPRESSION(S) / ED DIAGNOSES   Final diagnoses:  Perichondritis of auricle, bilateral     Rx / DC Orders   ED Discharge Orders          Ordered    predniSONE (STERAPRED UNI-PAK 21 TAB) 10 MG (21) TBPK tablet        02/05/23 1300             Note:  This document was prepared using Dragon voice recognition software and may include unintentional dictation  errors.   Jackelyn Hoehn, PA-C 02/05/23 1401    Merwyn Katos, MD 02/05/23 [phone]

## 2023-02-05 NOTE — ED Notes (Signed)
Pt to flex with recent diagnosis of bilateral ear infection. Ears are visibly inflamed and red on inspection and pt reports pain at the site. Last dose of Ciprofloxin---01/31/23 per patient.

## 2023-02-18 ENCOUNTER — Emergency Department: Payer: Medicaid Other

## 2023-02-18 ENCOUNTER — Emergency Department
Admission: EM | Admit: 2023-02-18 | Discharge: 2023-02-18 | Disposition: A | Discharge status: Home / Self Care | Payer: Self-pay | Attending: Emergency Medicine | Admitting: Emergency Medicine

## 2023-02-18 ENCOUNTER — Encounter: Payer: Self-pay | Admitting: Intensive Care

## 2023-02-18 ENCOUNTER — Other Ambulatory Visit: Payer: Self-pay

## 2023-02-18 DIAGNOSIS — N3001 Acute cystitis with hematuria: Secondary | ICD-10-CM

## 2023-02-18 DIAGNOSIS — H6063 Unspecified chronic otitis externa, bilateral: Secondary | ICD-10-CM

## 2023-02-18 DIAGNOSIS — J189 Pneumonia, unspecified organism: Secondary | ICD-10-CM

## 2023-02-18 DIAGNOSIS — D72829 Elevated white blood cell count, unspecified: Secondary | ICD-10-CM | POA: Insufficient documentation

## 2023-02-18 LAB — URINALYSIS, ROUTINE W REFLEX MICROSCOPIC
Bilirubin Urine: NEGATIVE
Glucose, UA: NEGATIVE mg/dL
Ketones, ur: NEGATIVE mg/dL
Nitrite: NEGATIVE
Protein, ur: 30 mg/dL — AB
Specific Gravity, Urine: 1.019 (ref 1.005–1.030)
WBC, UA: >50 WBC/hpf (ref 0–5)
pH: 6 (ref 5.0–8.0)

## 2023-02-18 LAB — CBC WITH DIFFERENTIAL/PLATELET
Abs Immature Granulocytes: 0.07 10*3/uL (ref 0.00–0.07)
Basophils Absolute: 0.1 10*3/uL (ref 0.0–0.1)
Basophils Relative: 1 %
Eosinophils Absolute: 0.4 10*3/uL (ref 0.0–0.5)
Eosinophils Relative: 2 %
HCT: 45.4 % (ref 36.0–46.0)
Hemoglobin: 14.9 g/dL (ref 12.0–15.0)
Immature Granulocytes: 1 %
Lymphocytes Relative: 17 %
Lymphs Abs: 2.6 10*3/uL (ref 0.7–4.0)
MCH: 29.7 pg (ref 26.0–34.0)
MCHC: 32.8 g/dL (ref 30.0–36.0)
MCV: 90.4 fL (ref 80.0–100.0)
Monocytes Absolute: 0.8 10*3/uL (ref 0.1–1.0)
Monocytes Relative: 5 %
Neutro Abs: 11.5 10*3/uL — ABNORMAL HIGH (ref 1.7–7.7)
Neutrophils Relative %: 74 %
Platelets: 382 10*3/uL (ref 150–400)
RBC: 5.02 MIL/uL (ref 3.87–5.11)
RDW: 14.1 % (ref 11.5–15.5)
WBC: 15.3 10*3/uL — ABNORMAL HIGH (ref 4.0–10.5)
nRBC: 0 % (ref 0.0–0.2)

## 2023-02-18 LAB — COMPREHENSIVE METABOLIC PANEL
ALT: 28 U/L (ref 0–44)
AST: 22 U/L (ref 15–41)
Albumin: 4 g/dL (ref 3.5–5.0)
Alkaline Phosphatase: 74 U/L (ref 38–126)
Anion gap: 10 (ref 5–15)
BUN: 15 mg/dL (ref 6–20)
CO2: 27 mmol/L (ref 22–32)
Calcium: 9.2 mg/dL (ref 8.9–10.3)
Chloride: 100 mmol/L (ref 98–111)
Creatinine, Ser: 0.9 mg/dL (ref 0.44–1.00)
GFR, Estimated: >60 mL/min (ref >60)
Glucose, Bld: 159 mg/dL — ABNORMAL HIGH (ref 70–99)
Potassium: 3.8 mmol/L (ref 3.5–5.1)
Sodium: 137 mmol/L (ref 135–145)
Total Bilirubin: 0.7 mg/dL (ref 0.3–1.2)
Total Protein: 8.4 g/dL — ABNORMAL HIGH (ref 6.5–8.1)

## 2023-02-18 MED ORDER — IPRATROPIUM-ALBUTEROL 0.5-2.5 (3) MG/3ML IN SOLN
3.0000 mL | Freq: Once | RESPIRATORY_TRACT | Status: AC
  Administered 2023-02-18: 3 mL via RESPIRATORY_TRACT
  Filled 2023-02-18: qty 3

## 2023-02-18 MED ORDER — SODIUM CHLORIDE 0.9 % IV SOLN
3.0000 g | Freq: Once | INTRAVENOUS | Status: AC
  Administered 2023-02-18: 3 g via INTRAVENOUS
  Filled 2023-02-18: qty 8

## 2023-02-18 MED ORDER — NYSTATIN 100000 UNIT/GM EX POWD: 1.0000 | Freq: Three times a day (TID) | CUTANEOUS | 0 refills | Status: DC

## 2023-02-18 MED ORDER — NYSTATIN 100000 UNIT/GM EX POWD
Freq: Once | CUTANEOUS | Status: AC
  Filled 2023-02-18: qty 15

## 2023-02-18 MED ORDER — CEFPODOXIME PROXETIL 200 MG PO TABS: 200.0000 mg | ORAL_TABLET | Freq: Two times a day (BID) | ORAL | 0 refills | Status: AC

## 2023-02-18 MED ORDER — OFLOXACIN 0.3 % OT SOLN: 5.0000 [drp] | Freq: Every day | OTIC | 0 refills | Status: AC

## 2023-02-18 NOTE — ED Triage Notes (Addendum)
Patient c/o bilateral ear pain and fluid draining from ears. Reports fluid is clear/white/green. Reports in the mornings she can barely hear from the fluid. Pain and fluid is worse when laying flat at night.   Seen on 02/05/23 for same and prescribed predisone and received IV antibiotics at ER.  Patient also c/o frequent urination with burning.   Patient reports "I have been having a hard time breathing"

## 2023-02-18 NOTE — Discharge Instructions (Addendum)
ENT can see you in their clinic tomorrow for no charge.  Please call the number for [name] tomorrow morning and they will get you scheduled for your appointment tomorrow to get this treated.  Please pick up your prescriptions from the pharmacy as well and take those as scheduled unless the ENT doctor makes a change.  You can take the oral antibiotics which treat the pneumonia and UTI.

## 2023-02-18 NOTE — ED Notes (Signed)
Pt noted to have red/pinkish chaffing and breakdown on skin around the groin and lower abdomen.

## 2023-02-18 NOTE — ED Provider Notes (Signed)
[facility] Provider Note    Event Date/Time   First MD Initiated Contact with Patient 02/18/23 1624     (approximate)   History   Ear Pain   HPI MABREY HOWLAND is a 52 y.o. female presenting today with bilateral ear pain.  Patient states she has been dealing with this ear pain for the past 2 months.  Had swelling initially to both ears with pain.  Urgently saw her primary care provider and underwent 3 different courses of ciprofloxacin treatment.  Ongoing symptoms and presented to the emergency department on 02/05/2023.  Was evaluated there over concern for possible contact dermatitis versus polychondritis.  Provider discussed the case with the ENT over the phone who agree with the assessment and recommended 1 dose of IV antibiotic and discharged on oral steroids.  Patient states pain initially proved with steroids but now is having pustular discharge from the area.  Notes worsening hearing at this time but is still able to hear on exam.  Separately, she has noticed intermittent wheezing over the past several months and was hoping for breathing treatment today along with pain with urination wanted to check her urine.     Physical Exam   Triage Vital Signs: ED Triage Vitals  Encounter Vitals Group     BP 02/18/23 1341 (!) 196/127     Systolic BP Percentile --      Diastolic BP Percentile --      Pulse Rate 02/18/23 1341 (!) 112     Resp 02/18/23 1341 20     Temp 02/18/23 1341 98.3 F (36.8 C)     Temp Source 02/18/23 1341 Oral     SpO2 02/18/23 1341 96 %     Weight 02/18/23 1343 (!) 310 lb (140.6 kg)     Height 02/18/23 1343 5\' 10"  (1.778 m)     Head Circumference --      Peak Flow --      Pain Score 02/18/23 1342 10     Pain Loc --      Pain Education --      Exclude from Growth Chart --     Most recent vital signs: Vitals:   02/18/23 1821 02/18/23 1830  BP:  (!) 141/81  Pulse:  (!) 111  Resp:    Temp: 98.2 F (36.8 C)   SpO2:  95%   I  have reviewed the vital signs. General:  Awake, alert, no acute distress. Head:  Normocephalic, Atraumatic. EENT:  PERRL, EOMI, Oral mucosa pink and moist, Neck is supple.  Slightly swollen ears but no erythema or specific tenderness palpation to bilateral ears.  There is purulent drainage coming out of the left ear.  Unable to fully visualize the TMs bilaterally. Cardiovascular: Regular rate, 2+ distal pulses. Respiratory:  Normal respiratory effort, symmetrical expansion, no distress.   Extremities:  Moving all four extremities through full ROM without pain.   Neuro:  Alert and oriented.  Interacting appropriately.   Skin:  Warm, dry, no rash.   Psych: Appropriate affect.    ED Results / Procedures / Treatments   Labs (all labs ordered are listed, but only abnormal results are displayed) Labs Reviewed  CBC WITH DIFFERENTIAL/PLATELET - Abnormal; Notable for the following components:      Result Value   WBC 15.3 (*)    Neutro Abs 11.5 (*)    All other components within normal limits  COMPREHENSIVE METABOLIC PANEL - Abnormal; Notable for the following components:  Glucose, Bld 159 (*)    Total Protein 8.4 (*)    All other components within normal limits  URINALYSIS, ROUTINE W REFLEX MICROSCOPIC - Abnormal; Notable for the following components:   Color, Urine YELLOW (*)    APPearance CLOUDY (*)    Hgb urine dipstick MODERATE (*)    Protein, ur 30 (*)    Leukocytes,Ua MODERATE (*)    Bacteria, UA RARE (*)    All other components within normal limits     EKG    RADIOLOGY    PROCEDURES:  Critical Care performed: No  Procedures   MEDICATIONS ORDERED IN ED: Medications  nystatin (MYCOSTATIN/NYSTOP) topical powder ( Topical Given 02/18/23 1810)  ipratropium-albuterol (DUONEB) 0.5-2.5 (3) MG/3ML nebulizer solution 3 mL (3 mLs Nebulization Given 02/18/23 1810)  Ampicillin-Sulbactam (UNASYN) 3 g in sodium chloride 0.9 % 100 mL IVPB (3 g Intravenous New Bag/Given 02/18/23  1811)     IMPRESSION / MDM / ASSESSMENT AND PLAN / ED COURSE  I reviewed the triage vital signs and the nursing notes.                              Differential diagnosis includes, but is not limited to, otitis media, otitis externa, perichondritis, pneumonia, UTI.  Patient's presentation is most consistent with acute illness / injury with system symptoms.  Patient is a 52 year old female presenting today for multiple complaints.  Most prominent, is ongoing bilateral ear pain with drainage from her ears.  Exam does not show significant swelling or edema to bilateral ears and they appear flaky.  I do notice swelling to the inner ear with pus drainage present at this time.  Not able to fully visualize the TMs.  No tenderness to palpation over the mastoids bilaterally.  Concerning for otitis externa.  No suspicion for mastoiditis.  Separately, patient states intermittent shortness of breath over the past 2 months.  Mild expiratory wheeze noted only in the apices and patient was given a DuoNeb with symptomatic resolution.  Chest x-ray concerning for questionable pneumonia.  Also having dysuria symptoms with UA indicative of UTI.  Patient otherwise stable with no other complaints at this time.  Plan to discharge with cefpodoxime which will treat UTI and pneumonia along with possible otitis media.  She will be given ofloxacin drops to treat otitis externa.  I spoke with ENT Dr. Okey Dupre who states that he can get patient into his clinic tomorrow.  Patient is happy with this plan and wished to be discharged at this time.  Antibiotics sent to her pharmacy with planned ENT follow-up tomorrow.  Separately, patient has appearance of yeast infection underneath her pannus and will treat with nystatin.  The patient is on the cardiac monitor to evaluate for evidence of arrhythmia and/or significant heart rate changes. Clinical Course as of 02/18/23 1927  Mon Feb 18, 2023  1625 WBC(!): 15.3 Patient recently completed  a 7-day course of prednisone and may be elevated due to this but cannot fully rule out further infectious etiology [DW]  1729 Spoke with Dr. Okey Dupre with ENT over the phone.  He recommends 1 dose of IV antibiotics here and then discharged with follow-up in the clinic tomorrow. [DW]  1802 DG Chest 2 View Questionable pneumonia seen on exam and given some intermittent shortness of breath symptoms with slight wheezing.  Will treat as a pneumonia at this point. [DW]  1853 Urinalysis, Routine w reflex microscopic -Urine, Clean CatchMarland Kitchen)  Positive for UTI [DW]    Clinical Course User Index [DW] Janith Lima, MD     FINAL CLINICAL IMPRESSION(S) / ED DIAGNOSES   Final diagnoses:  Chronic otitis externa of both ears, unspecified type  Pneumonia due to infectious organism, unspecified laterality, unspecified part of lung  Acute cystitis with hematuria     Rx / DC Orders   ED Discharge Orders          Ordered    cefpodoxime (VANTIN) 200 MG tablet  2 times daily        02/18/23 1910    nystatin (MYCOSTATIN/NYSTOP) powder  3 times daily        02/18/23 1910    ofloxacin (FLOXIN) 0.3 % OTIC solution  Daily        02/18/23 1910             Note:  This document was prepared using Dragon voice recognition software and may include unintentional dictation errors.   Janith Lima, MD 02/18/23 1929

## 2023-08-05 DIAGNOSIS — M179 Osteoarthritis of knee, unspecified: Secondary | ICD-10-CM | POA: Insufficient documentation

## 2023-08-06 ENCOUNTER — Encounter: Payer: Self-pay | Admitting: General Practice

## 2023-08-06 ENCOUNTER — Ambulatory Visit (INDEPENDENT_AMBULATORY_CARE_PROVIDER_SITE_OTHER): Payer: Medicaid Other | Admitting: General Practice

## 2023-08-06 VITALS — BP 184/110 | HR 97 | Temp 98.2°F | Ht 70.0 in | Wt 351.0 lb

## 2023-08-06 DIAGNOSIS — G4700 Insomnia, unspecified: Secondary | ICD-10-CM | POA: Diagnosis not present

## 2023-08-06 DIAGNOSIS — Z720 Tobacco use: Secondary | ICD-10-CM | POA: Diagnosis not present

## 2023-08-06 DIAGNOSIS — Z1159 Encounter for screening for other viral diseases: Secondary | ICD-10-CM

## 2023-08-06 DIAGNOSIS — E669 Obesity, unspecified: Secondary | ICD-10-CM | POA: Insufficient documentation

## 2023-08-06 DIAGNOSIS — Z7689 Persons encountering health services in other specified circumstances: Secondary | ICD-10-CM | POA: Insufficient documentation

## 2023-08-06 DIAGNOSIS — Z6841 Body Mass Index (BMI) 40.0 and over, adult: Secondary | ICD-10-CM | POA: Diagnosis not present

## 2023-08-06 DIAGNOSIS — E66813 Obesity, class 3: Secondary | ICD-10-CM | POA: Insufficient documentation

## 2023-08-06 DIAGNOSIS — I10 Essential (primary) hypertension: Secondary | ICD-10-CM | POA: Diagnosis not present

## 2023-08-06 DIAGNOSIS — Z114 Encounter for screening for human immunodeficiency virus [HIV]: Secondary | ICD-10-CM | POA: Diagnosis not present

## 2023-08-06 DIAGNOSIS — Z23 Encounter for immunization: Secondary | ICD-10-CM

## 2023-08-06 MED ORDER — LOSARTAN POTASSIUM-HCTZ 100-25 MG PO TABS: 1.0000 | ORAL_TABLET | Freq: Every day | ORAL | 0 refills | Status: DC

## 2023-08-06 MED ORDER — FUROSEMIDE 20 MG PO TABS: 20.0000 mg | ORAL_TABLET | Freq: Every day | ORAL | 0 refills | Status: DC

## 2023-08-06 MED ORDER — TRAZODONE HCL 50 MG PO TABS: 50.0000 mg | ORAL_TABLET | Freq: Every day | ORAL | 0 refills | Status: DC

## 2023-08-06 NOTE — Assessment & Plan Note (Signed)
 Uncontrolled. Both blood pressure readings in office elevated.  Resumed losartan hydrochlorothiazide 100-25 mg tablet once daily, Lasix 20 mg once daily. Discussed keeping blood pressure log, medication adherence.  Exam stable today. ER precautions given. Labs pending. Follow-up in 2 weeks.

## 2023-08-06 NOTE — Assessment & Plan Note (Signed)
 EMR reviewed briefly.

## 2023-08-06 NOTE — Progress Notes (Signed)
 New Patient Office Visit  Subjective    Patient ID: [name], female    DOB: Dec 01, 1970  Age: 53 y.o. MRN: [id number]  CC:  Chief Complaint  Patient presents with   New Patient (Initial Visit)   Obesity    Wants to discuss weight and options   Insomnia    Discuss sleep issues; was on trazodone in the past.    Pain    In both legs; takes tylenol but only helps some.    Hypertension    HPI [name] is a 53 y.o. female presents to establish care.   Last pcp/physical/labs: [facility] practice (per patient the practice closed down)  Insomnia: she was previously managed on Trazodone in 2024; she has been off medication since November. She would like restart trazodone. Her concern is staying asleep. She can fall asleep right away and then she would wake up and stay awake for 15 mins to an hour. She works from home and her hours are 2:30-11 which she feels is the reason that she is having sleep issues.   Hypertension: diagnosed many years ago. She was previously managed on losartan-hydrochlorothiazide 100-25 mg once daily and lasix 20 mg once daily. She has been off her medications since November.  She does not check her blood pressure at home.  She has had some blurred vision which she attributes that to her needing new glasses.  She denies any chest pain, shortness of breath, difficulty breathing.  Obesity: Patient reports this has gotten worse over the last year and a half.  She became immobile due to her knees and requires bilateral knee surgery. She was told that she needed to loose weight. She did loose the weight but didn't have insurance to get the surgery. She reports that she eats small portions but just the quality of foods she eat. Due to her immobility, she is dependent on others and just eats what they cook. She tries to eat a lot of protein if she can.  She is not able to exercise.  Tobacco abuse: Has been smoking cigarettes since she was 21.  She smokes 1.5 pack  daily.  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  She reports that she has had alcohol abuse in the past as well as drug abuse that she was all able to quit but cigarettes have been very challenging.  Outpatient Encounter Medications as of 08/06/2023  Medication Sig   traZODone (DESYREL) 50 MG tablet Take 1 tablet (50 mg total) by mouth at bedtime.   furosemide (LASIX) 20 MG tablet Take 1 tablet (20 mg total) by mouth daily.   losartan-hydrochlorothiazide (HYZAAR) 100-25 MG tablet Take 1 tablet by mouth daily.   [DISCONTINUED] furosemide (LASIX) 20 MG tablet Take 1 tablet (20 mg total) by mouth daily.   [DISCONTINUED] ibuprofen (ADVIL) 200 MG tablet Take 400 mg by mouth every 6 (six) hours as needed for mild pain.   [DISCONTINUED] losartan-hydrochlorothiazide (HYZAAR) 100-25 MG tablet Take 1 tablet by mouth daily.   [DISCONTINUED] nystatin (MYCOSTATIN/NYSTOP) powder Apply 1 Application topically 3 (three) times daily.   [DISCONTINUED] predniSONE (STERAPRED UNI-PAK 21 TAB) 10 MG (21) TBPK tablet Take 6 tablets on day 1 and decrease by 1 tablet for each subsequent day   [DISCONTINUED] SUBOXONE 8-2 MG FILM Place 1 Film under the tongue 2 (two) times daily.   No facility-administered encounter medications on file as of 08/06/2023.    Past Medical History:  Diagnosis Date   Anxiety    Arthritis    Cholecystitis with cholelithiasis 02/25/2013   Complication of anesthesia    woke up crying after tubes tied, anxiety attack   Depression    Depression 04/12/2017   GERD (gastroesophageal reflux disease)    Hypertension    Migraine    no longer have these   MRSA carrier     Past Surgical History:  Procedure Laterality Date   CHOLECYSTECTOMY     RADIOLOGY WITH ANESTHESIA Bilateral 08/08/2021   Procedure: MRI WITH BILATERAL HIPWITHOUT CONTRAST,RIGHT  KNEE WITHOUT CONTRAST;  Surgeon: Radiologist, Medication, MD;  Location: [facility] OR;  Service: Radiology;  Laterality: Bilateral;    TONSILLECTOMY     TUBAL LIGATION      History reviewed. No pertinent family history.  Social History   Socioeconomic History   Marital status: Single    Spouse name: Not on file   Number of children: Not on file   Years of education: Not on file   Highest education level: Some college, no degree  Occupational History   Not on file  Tobacco Use   Smoking status: Every Day    Current packs/day: 1.00    Average packs/day: 1 pack/day for 32.2 years (32.2 ttl pk-yrs)    Types: Cigarettes    Start date: 22   Smokeless tobacco: Never  Vaping Use   Vaping status: Never Used  Substance and Sexual Activity   Alcohol use: Not Currently   Drug use: Yes    Comment: THC   Sexual activity: Not Currently  Other Topics Concern   Not on file  Social History Narrative   Not on file   Social Drivers of Health   Financial Resource Strain: Not on file  Food Insecurity: No Food Insecurity (07/15/2020)   Received from [facility], [facility]   Hunger Vital Sign    Worried About Running Out of Food in the Last Year: Never true    Ran Out of Food in the Last Year: Never true  Transportation Needs: Not on file  Physical Activity: Not on file  Stress: Not on file  Social Connections: Unknown (09/24/2021)   Received from [facility], [facility]   Social Network    Social Network: Not on file  Intimate Partner Violence: Unknown (08/16/2021)   Received from [facility], [facility]   HITS    Physically Hurt: Not on file    Insult or Talk Down To: Not on file    Threaten Physical Harm: Not on file    Scream or Curse: Not on file    Review of Systems  Constitutional:  Negative for chills and fever.  Respiratory:  Negative for shortness of breath.   Cardiovascular:  Negative for chest pain.  Gastrointestinal:  Negative for abdominal pain, constipation, diarrhea, heartburn, nausea and vomiting.  Genitourinary:  Negative for dysuria, frequency and urgency.  Neurological:   Negative for dizziness and headaches.  Endo/Heme/Allergies:  Negative for polydipsia.  Psychiatric/Behavioral:  Negative for depression and suicidal ideas. The patient is not nervous/anxious.         Objective    BP (!) 184/110 (BP Location: Left Arm, Patient Position: Sitting, Cuff Size: Large)   Pulse 97   Temp 98.2 F (36.8 C) (Oral)   Ht 5\' 10"  (1.778 m)   Wt (!) 351 lb (159.2 kg)   LMP 01/30/2016 (Approximate)   SpO2 100%   BMI 50.36 kg/m   Physical Exam Vitals and nursing note  reviewed.  Constitutional:      Appearance: Normal appearance.  Cardiovascular:     Rate and Rhythm: Normal rate and regular rhythm.     Pulses: Normal pulses.     Heart sounds: Normal heart sounds.  Pulmonary:     Effort: Pulmonary effort is normal.     Breath sounds: Normal breath sounds.  Neurological:     Mental Status: She is alert and oriented to person, place, and time.  Psychiatric:        Mood and Affect: Mood normal.        Behavior: Behavior normal.        Thought Content: Thought content normal.        Judgment: Judgment normal.         Assessment & Plan:  Establishing care with new doctor, encounter for Assessment & Plan: EMR reviewed briefly.  Orders: -     Hemoglobin A1c  Essential hypertension Assessment & Plan: Uncontrolled. Both blood pressure readings in office elevated.  Resumed losartan hydrochlorothiazide 100-25 mg tablet once daily, Lasix 20 mg once daily. Discussed keeping blood pressure log, medication adherence.  Exam stable today. ER precautions given. Labs pending. Follow-up in 2 weeks.  Orders: -     Losartan Potassium-HCTZ; Take 1 tablet by mouth daily.  Dispense: 30 tablet; Refill: 0 -     Furosemide; Take 1 tablet (20 mg total) by mouth daily.  Dispense: 30 tablet; Refill: 0 -     CBC -     Comprehensive metabolic panel -     TSH -     Lipid panel -     Hemoglobin A1c  Insomnia, unspecified type Assessment &  Plan: Uncontrolled. Discussed medication adherence. Resume trazodone 50 mg at bedtime.  Refill sent.  Orders: -     traZODone HCl; Take 1 tablet (50 mg total) by mouth at bedtime.  Dispense: 30 tablet; Refill: 0 -     VITAMIN D 25 Hydroxy (Vit-D Deficiency, Fractures) -     Hemoglobin A1c  Class 3 severe obesity due to excess calories with serious comorbidity and body mass index (BMI) of 50.0 to 59.9 in adult [facility]) Assessment & Plan: Uncontrolled.  Labs pending to rule out metabolic cause.  Consider healthy weight and wellness referral.  Orders: -     CBC -     Comprehensive metabolic panel -     TSH -     Lipid panel -     Hemoglobin A1c  Screening for HIV (human immunodeficiency virus) -     HIV Antibody (routine testing w rflx) -     Hemoglobin A1c  Need for hepatitis C screening test -     Hepatitis C antibody -     Hemoglobin A1c  Need for influenza vaccination -     Flu vaccine trivalent PF, 6mos and older(Flulaval,Afluria,Fluarix,Fluzone)  Need for pneumococcal 20-valent conjugate vaccination -     Pneumococcal conjugate vaccine 20-valent  Continuous tobacco abuse Assessment & Plan: Uncontrolled.  Smoking cessation instruction/counseling given:  counseled patient on the dangers of tobacco use, advised patient to stop smoking, and reviewed strategies to maximize success  Will discuss lung cancer screening at next visit.      Return in about 2 weeks (around 08/20/2023) for blood pressure, knee pain.   Modesto Charon, NP

## 2023-08-06 NOTE — Assessment & Plan Note (Signed)
 Uncontrolled. Discussed medication adherence. Resume trazodone 50 mg at bedtime.  Refill sent.

## 2023-08-06 NOTE — Patient Instructions (Addendum)
 Stop by the lab prior to leaving today. I will notify you of your results once received.   Start Trazodone 50 mg at bedtime.   Keep blood pressure log and bring it back in two weeks.   Restart BP medications.   Follow up in two weeks for pain, blood pressure, health maintenance.  It was a pleasure meeting you!

## 2023-08-06 NOTE — Assessment & Plan Note (Signed)
 Uncontrolled.  Labs pending to rule out metabolic cause.  Consider healthy weight and wellness referral.

## 2023-08-06 NOTE — Assessment & Plan Note (Addendum)
 Uncontrolled.  Smoking cessation instruction/counseling given:  counseled patient on the dangers of tobacco use, advised patient to stop smoking, and reviewed strategies to maximize success  Will discuss lung cancer screening at next visit.

## 2023-08-07 LAB — HEPATITIS C ANTIBODY: Hepatitis C Ab: NONREACTIVE

## 2023-08-07 LAB — COMPREHENSIVE METABOLIC PANEL WITH GFR
ALT: 26 U/L (ref 0–35)
AST: 19 U/L (ref 0–37)
Albumin: 4.1 g/dL (ref 3.5–5.2)
Alkaline Phosphatase: 96 U/L (ref 39–117)
BUN: 12 mg/dL (ref 6–23)
CO2: 27 meq/L (ref 19–32)
Calcium: 9.4 mg/dL (ref 8.4–10.5)
Chloride: 101 meq/L (ref 96–112)
Creatinine, Ser: 0.75 mg/dL (ref 0.40–1.20)
GFR: 91.57 mL/min (ref >60.00)
Glucose, Bld: 119 mg/dL — ABNORMAL HIGH (ref 70–99)
Potassium: 4.6 meq/L (ref 3.5–5.1)
Sodium: 137 meq/L (ref 135–145)
Total Bilirubin: 0.4 mg/dL (ref 0.2–1.2)
Total Protein: 7.5 g/dL (ref 6.0–8.3)

## 2023-08-07 LAB — LIPID PANEL
Cholesterol: 83 mg/dL (ref 0–200)
HDL: 41.9 mg/dL (ref >39.00)
LDL Cholesterol: 24 mg/dL (ref 0–99)
NonHDL: 41.1
Total CHOL/HDL Ratio: 2
Triglycerides: 88 mg/dL (ref 0.0–149.0)
VLDL: 17.6 mg/dL (ref 0.0–40.0)

## 2023-08-07 LAB — HEMOGLOBIN A1C: Hgb A1c MFr Bld: 7.2 % — ABNORMAL HIGH (ref 4.6–6.5)

## 2023-08-07 LAB — CBC
HCT: 45.4 % (ref 36.0–46.0)
Hemoglobin: 14.8 g/dL (ref 12.0–15.0)
MCHC: 32.5 g/dL (ref 30.0–36.0)
MCV: 90.7 fl (ref 78.0–100.0)
Platelets: 352 10*3/uL (ref 150.0–400.0)
RBC: 5.01 Mil/uL (ref 3.87–5.11)
RDW: 13.6 % (ref 11.5–15.5)
WBC: 11.5 10*3/uL — ABNORMAL HIGH (ref 4.0–10.5)

## 2023-08-07 LAB — HIV ANTIBODY (ROUTINE TESTING W REFLEX): HIV 1&2 Ab, 4th Generation: NONREACTIVE

## 2023-08-08 LAB — TSH: TSH: 2.21 u[IU]/mL (ref 0.35–5.50)

## 2023-08-08 LAB — VITAMIN D 25 HYDROXY (VIT D DEFICIENCY, FRACTURES): VITD: <7 ng/mL — ABNORMAL LOW (ref 30.00–100.00)

## 2023-08-15 ENCOUNTER — Other Ambulatory Visit: Payer: Self-pay | Admitting: General Practice

## 2023-08-15 ENCOUNTER — Telehealth: Payer: Self-pay

## 2023-08-15 DIAGNOSIS — E559 Vitamin D deficiency, unspecified: Secondary | ICD-10-CM | POA: Insufficient documentation

## 2023-08-15 DIAGNOSIS — E1122 Type 2 diabetes mellitus with diabetic chronic kidney disease: Secondary | ICD-10-CM | POA: Insufficient documentation

## 2023-08-15 MED ORDER — VITAMIN D (ERGOCALCIFEROL) 1.25 MG (50000 UNIT) PO CAPS: 50000.0000 [IU] | ORAL_CAPSULE | ORAL | 0 refills | Status: DC

## 2023-08-15 MED ORDER — METFORMIN HCL ER 500 MG PO TB24: 500.0000 mg | ORAL_TABLET | Freq: Every day | ORAL | 0 refills | Status: DC

## 2023-08-15 NOTE — Telephone Encounter (Signed)
 Patient was supposed to make 2 week follow after her last appt and did not. Please call patient to set up her appt

## 2023-08-15 NOTE — Telephone Encounter (Signed)
 Patient has been scheduled

## 2023-08-20 ENCOUNTER — Encounter: Payer: Self-pay | Admitting: General Practice

## 2023-08-20 ENCOUNTER — Ambulatory Visit: Admitting: General Practice

## 2023-08-20 VITALS — BP 120/90 | HR 106 | Temp 99.4°F | Ht 70.0 in | Wt 339.0 lb

## 2023-08-20 DIAGNOSIS — E1122 Type 2 diabetes mellitus with diabetic chronic kidney disease: Secondary | ICD-10-CM

## 2023-08-20 DIAGNOSIS — Z6841 Body Mass Index (BMI) 40.0 and over, adult: Secondary | ICD-10-CM

## 2023-08-20 DIAGNOSIS — Z720 Tobacco use: Secondary | ICD-10-CM

## 2023-08-20 DIAGNOSIS — E66813 Obesity, class 3: Secondary | ICD-10-CM

## 2023-08-20 DIAGNOSIS — Z7984 Long term (current) use of oral hypoglycemic drugs: Secondary | ICD-10-CM | POA: Diagnosis not present

## 2023-08-20 DIAGNOSIS — I10 Essential (primary) hypertension: Secondary | ICD-10-CM | POA: Diagnosis not present

## 2023-08-20 DIAGNOSIS — M170 Bilateral primary osteoarthritis of knee: Secondary | ICD-10-CM

## 2023-08-20 DIAGNOSIS — N181 Chronic kidney disease, stage 1: Secondary | ICD-10-CM

## 2023-08-20 DIAGNOSIS — Z139 Encounter for screening, unspecified: Secondary | ICD-10-CM | POA: Insufficient documentation

## 2023-08-20 DIAGNOSIS — H6063 Unspecified chronic otitis externa, bilateral: Secondary | ICD-10-CM

## 2023-08-20 LAB — BASIC METABOLIC PANEL WITH GFR
BUN: 12 mg/dL (ref 6–23)
CO2: 28 meq/L (ref 19–32)
Calcium: 9.6 mg/dL (ref 8.4–10.5)
Chloride: 99 meq/L (ref 96–112)
Creatinine, Ser: 0.82 mg/dL (ref 0.40–1.20)
GFR: 82.25 mL/min (ref >60.00)
Glucose, Bld: 127 mg/dL — ABNORMAL HIGH (ref 70–99)
Potassium: 4.4 meq/L (ref 3.5–5.1)
Sodium: 137 meq/L (ref 135–145)

## 2023-08-20 MED ORDER — OFLOXACIN 0.3 % OT SOLN: 10.0000 [drp] | Freq: Every day | OTIC | 0 refills | Status: DC

## 2023-08-20 MED ORDER — BLOOD GLUCOSE TEST VI STRP: 1.0000 | ORAL_STRIP | Freq: Three times a day (TID) | 0 refills | Status: AC

## 2023-08-20 MED ORDER — BLOOD GLUCOSE MONITORING SUPPL DEVI: 1.0000 | Freq: Three times a day (TID) | 0 refills | Status: AC

## 2023-08-20 MED ORDER — LISINOPRIL 5 MG PO TABS: 5.0000 mg | ORAL_TABLET | Freq: Every day | ORAL | 0 refills | Status: DC

## 2023-08-20 MED ORDER — LANCETS MISC. MISC: 1.0000 | Freq: Three times a day (TID) | 0 refills | Status: DC

## 2023-08-20 MED ORDER — LANCET DEVICE MISC: 1.0000 | Freq: Three times a day (TID) | 0 refills | Status: AC

## 2023-08-20 NOTE — Assessment & Plan Note (Signed)
 Discussed cessation

## 2023-08-20 NOTE — Assessment & Plan Note (Addendum)
 Slightly better.  Continue losartan hydrochlorothiazide 100-25 mg tablet once daily and Lasix 20 mg once daily. Start lisinopril 5 mg once daily.  Rx sent  BMP pending.  Continue measuring blood pressures at home with a new blood pressure cuff.   Follow-up in 4 weeks.

## 2023-08-20 NOTE — Assessment & Plan Note (Signed)
 Symptoms and presentation suggestive of otitis externa. Discussed avoid using headphones however she works from home and has to use private phones for her work.  Start ofloxacin 0.3% otic solution 10 drops in each ear for 7 days.  Follow-up in 4 weeks.

## 2023-08-20 NOTE — Assessment & Plan Note (Signed)
 Has transportation issues. Referral placed for social work.

## 2023-08-20 NOTE — Assessment & Plan Note (Signed)
 Referral placed for healthy weight and wellness.

## 2023-08-20 NOTE — Assessment & Plan Note (Signed)
 Uncontrolled. Continue metformin 500 mg once daily.  Foot exam completed today.  Urine ACR orders placed patient will come back in provide urine sample.  Patient will schedule eye exam.

## 2023-08-20 NOTE — Patient Instructions (Addendum)
 Stop by the lab prior to leaving today. I will notify you of your results once received.   Schedule diabetes eye exam.   Start checking your blood sugar levels.  Appropriate times to check your blood sugar levels are:  -Before any meal (breakfast, lunch, dinner) -Two hours after any meal (breakfast, lunch, dinner) -Bedtime  Record your readings and notify me if you continue to consistently run at or above 150.   Start monitoring your blood pressure daily, around the same time of day, for the next 2-3 weeks.  Ensure that you have rested for 30 minutes prior to checking your blood pressure.   Record your readings and notify me if you see numbers consistently at or above 130 on top and/or 90 on bottom.  Follow up in 4 weeks.

## 2023-08-20 NOTE — Progress Notes (Signed)
 Established Patient Office Visit  Subjective   Patient ID: [name], female    DOB: 21-Oct-1970  Age: 53 y.o. MRN: [id number]  Chief Complaint  Patient presents with   Hypertension    Not much better    Hypertension Pertinent negatives include no chest pain, headaches or shortness of breath.    Josefa Half. Schwebke is a 53 year old female with past medical history of Type 2 DM, Vitamin d deficiency, insomina, tobacco abuse, O.A of knee, HTN presents today for a follow up.   Hypertension: currently managed on losartan-hydrochlorothiazide 100-25 once daily. She is checking her BP at home her readings range anywhere between 180s-200s systolic- 100s. She brought in her cuff today to get it looked at. She denies any blurred vision, headaches, chest pain or shortness of breath.   Type 2 DM: newly diagnosed with hemoglobin A1c 7.2 on 08/06/2023. Current medications include Metformin 500 mg once daily. She has cut down on her soda intake by 50%. She has switched sugar free creamer.  She has been having her medications daily.  She would like a referral to healthy weight and wellness to discuss her diet and weight loss medication.   Ear fullness: she has a history of ear infections and is concerned that she has one now. She feels her ears are full of fluid. Fluid drips at night. She denies any pain. She does endorse some hearing loss in the morning until she has cleaned her ears out.   Patient Active Problem List   Diagnosis Date Noted   Chronic otitis externa of both ears 08/20/2023   Encounter for screening involving social determinants of health (SDoH) 08/20/2023   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) 08/15/2023   Vitamin D deficiency 08/15/2023   Insomnia 08/06/2023   Class 3 severe obesity due to excess calories with serious comorbidity and body mass index (BMI) of 50.0 to 59.9 in adult (HCC) 08/06/2023   Continuous tobacco abuse 08/06/2023   Osteoarthritis  of knee 08/05/2023   Essential hypertension 04/12/2017   Past Medical History:  Diagnosis Date   Anxiety    Arthritis    Cholecystitis with cholelithiasis 02/25/2013   Complication of anesthesia    woke up crying after tubes tied, anxiety attack   Depression    Depression 04/12/2017   GERD (gastroesophageal reflux disease)    Hypertension    Migraine    no longer have these   MRSA carrier    Past Surgical History:  Procedure Laterality Date   CHOLECYSTECTOMY     RADIOLOGY WITH ANESTHESIA Bilateral 08/08/2021   Procedure: MRI WITH BILATERAL HIPWITHOUT CONTRAST,RIGHT  KNEE WITHOUT CONTRAST;  Surgeon: Radiologist, Medication, MD;  Location: [facility] OR;  Service: Radiology;  Laterality: Bilateral;   TONSILLECTOMY     TUBAL LIGATION     No Known Allergies       08/20/2023   11:02 AM 08/06/2023    2:00 PM  Depression screen PHQ 2/9  Decreased Interest 1 0  Down, Depressed, Hopeless 1 0  PHQ - 2 Score 2 0  Altered sleeping 2 0  Tired, decreased energy 1 0  Change in appetite 0 0  Feeling bad or failure about yourself  1 0  Trouble concentrating 0 0  Moving slowly or fidgety/restless 0 0  Suicidal thoughts 0 0  PHQ-9 Score 6 0  Difficult doing work/chores Very difficult Not difficult at all       08/20/2023  11:02 AM 08/06/2023    2:00 PM  GAD 7 : Generalized Anxiety Score  Nervous, Anxious, on Edge 1 0  Control/stop worrying 2 0  Worry too much - different things 2 0  Trouble relaxing 2 0  Restless 0 0  Easily annoyed or irritable 2 0  Afraid - awful might happen 2 0  Total GAD 7 Score 11 0  Anxiety Difficulty Very difficult Not difficult at all      Review of Systems  Constitutional:  Negative for chills and fever.  HENT:  Positive for ear discharge.   Respiratory:  Negative for shortness of breath.   Cardiovascular:  Negative for chest pain.  Gastrointestinal:  Negative for abdominal pain, constipation, diarrhea, heartburn, nausea and vomiting.  Genitourinary:   Negative for dysuria, frequency and urgency.  Neurological:  Negative for dizziness and headaches.  Endo/Heme/Allergies:  Negative for polydipsia.  Psychiatric/Behavioral:  Negative for depression and suicidal ideas. The patient is not nervous/anxious.       Objective:     BP (!) 120/90 (BP Location: Left Arm, Patient Position: Sitting, Cuff Size: Large)   Pulse (!) 106   Temp 99.4 F (37.4 C) (Oral)   Ht 5\' 10"  (1.778 m)   Wt (!) 339 lb (153.8 kg)   LMP 01/30/2016 (Approximate)   SpO2 96%   BMI 48.64 kg/m  BP Readings from Last 3 Encounters:  08/20/23 (!) 120/90  08/06/23 (!) 184/110  02/18/23 (!) 141/81   Wt Readings from Last 3 Encounters:  08/20/23 (!) 339 lb (153.8 kg)  08/06/23 (!) 351 lb (159.2 kg)  02/18/23 (!) 310 lb (140.6 kg)      Physical Exam Vitals and nursing note reviewed.  Constitutional:      Appearance: Normal appearance.  HENT:     Right Ear: Drainage present.     Left Ear: Drainage present.  Cardiovascular:     Rate and Rhythm: Normal rate and regular rhythm.     Pulses: Normal pulses.     Heart sounds: Normal heart sounds.  Pulmonary:     Effort: Pulmonary effort is normal.     Breath sounds: Normal breath sounds.  Neurological:     Mental Status: She is alert and oriented to person, place, and time.  Psychiatric:        Mood and Affect: Mood normal.        Behavior: Behavior normal.        Thought Content: Thought content normal.        Judgment: Judgment normal.      No results found for any visits on 08/20/23.     The ASCVD Risk score (Arnett DK, et al., 2019) failed to calculate for the following reasons:   The valid total cholesterol range is 130 to 320 mg/dL    Assessment & Plan:  Type 2 diabetes mellitus with stage 1 chronic kidney disease, unspecified whether long term insulin use (HCC) Assessment & Plan: Uncontrolled. Continue metformin 500 mg once daily.  Foot exam completed today.  Urine ACR orders placed  patient will come back in provide urine sample.  Patient will schedule eye exam.  Orders: -     Blood Glucose Monitoring Suppl; 1 each by Does not apply route in the morning, at noon, and at bedtime. May substitute to any manufacturer covered by patient's insurance.  Dispense: 1 each; Refill: 0 -     Blood Glucose Test; 1 each by In Vitro route in the morning, at noon, and  at bedtime. May substitute to any manufacturer covered by patient's insurance.  Dispense: 90 each; Refill: 0 -     Lancet Device; 1 each by Does not apply route in the morning, at noon, and at bedtime. May substitute to any manufacturer covered by patient's insurance.  Dispense: 1 each; Refill: 0 -     Lancets Misc.; 1 each by Does not apply route in the morning, at noon, and at bedtime. May substitute to any manufacturer covered by patient's insurance.  Dispense: 100 each; Refill: 0 -     Microalbumin / creatinine urine ratio; Future -     Amb Ref to Medical Weight Management  Essential hypertension Assessment & Plan: Slightly better.  Continue losartan hydrochlorothiazide 100-25 mg tablet once daily and Lasix 20 mg once daily. Start lisinopril 5 mg once daily.  Rx sent  BMP pending.  Continue measuring blood pressures at home with a new blood pressure cuff.   Follow-up in 4 weeks.  Orders: -     Lisinopril; Take 1 tablet (5 mg total) by mouth daily.  Dispense: 30 tablet; Refill: 0 -     Basic metabolic panel with GFR -     Amb Ref to Medical Weight Management  Encounter for screening involving social determinants of health [facility]) Assessment & Plan: Has transportation issues. Referral placed for social work.  Orders: -     AMB Referral [facility] Care Management  Chronic otitis externa of both ears, unspecified type Assessment & Plan: Symptoms and presentation suggestive of otitis externa. Discussed avoid using headphones however she works from home and has to use private phones for her work.  Start ofloxacin  0.3% otic solution 10 drops in each ear for 7 days.  Follow-up in 4 weeks.  Orders: -     Ofloxacin; Place 10 drops into both ears daily. For 7 days.  Dispense: 5 mL; Refill: 0  Class 3 severe obesity due to excess calories with serious comorbidity and body mass index (BMI) of 50.0 to 59.9 in adult [facility]) Assessment & Plan: Referral placed for healthy weight and wellness.   Osteoarthritis of both knees, unspecified osteoarthritis type Assessment & Plan: Referral placed for healthy weight and wellness.  Orders: -     Amb Ref to Medical Weight Management  Continuous tobacco abuse Assessment & Plan: Discussed cessation.     Return in about 4 weeks (around 09/17/2023) for blood pressure and diabetes.    [place] Charon, NP

## 2023-08-22 ENCOUNTER — Telehealth: Payer: Self-pay | Admitting: General Practice

## 2023-08-22 NOTE — Telephone Encounter (Signed)
 Copied from CRM [id number]. Topic: General - Other >> Aug 22, 2023 12:20 PM Melissa C wrote: Reason for CRM: patient's place of work will be faxing over some documents regarding accommodations for patient for physician to complete. Patient is asking that physician give her a call regarding these documents please. Thank you.

## 2023-08-23 NOTE — Telephone Encounter (Signed)
 Just received forms in [employer]. Called patient to discuss what was needed. Patient states she would like her lunch break extended to 45 minutes from 30 minutes. Would also like an additional 30 minute break to use as needed for stretching and movement since she is in the wheelchair. I advised patient we may need to do a visit with her to discuss the forms if Norma Fredrickson has questions about them or anything and patient was okay with this if needed.  Forms has been placed in providers box for review.

## 2023-08-27 NOTE — Telephone Encounter (Signed)
 Called patient scheduled for virtual visit and forms given to provider for visit.

## 2023-08-28 ENCOUNTER — Other Ambulatory Visit (HOSPITAL_COMMUNITY): Payer: Self-pay

## 2023-08-28 ENCOUNTER — Telehealth: Payer: Self-pay

## 2023-08-28 ENCOUNTER — Telehealth: Admitting: General Practice

## 2023-08-28 ENCOUNTER — Encounter: Payer: Self-pay | Admitting: General Practice

## 2023-08-28 VITALS — Ht 70.0 in | Wt 339.0 lb

## 2023-08-28 DIAGNOSIS — Z7984 Long term (current) use of oral hypoglycemic drugs: Secondary | ICD-10-CM

## 2023-08-28 DIAGNOSIS — E1122 Type 2 diabetes mellitus with diabetic chronic kidney disease: Secondary | ICD-10-CM

## 2023-08-28 DIAGNOSIS — E66813 Obesity, class 3: Secondary | ICD-10-CM

## 2023-08-28 DIAGNOSIS — M170 Bilateral primary osteoarthritis of knee: Secondary | ICD-10-CM | POA: Diagnosis not present

## 2023-08-28 DIAGNOSIS — N181 Chronic kidney disease, stage 1: Secondary | ICD-10-CM

## 2023-08-28 DIAGNOSIS — Z7985 Long-term (current) use of injectable non-insulin antidiabetic drugs: Secondary | ICD-10-CM

## 2023-08-28 DIAGNOSIS — Z6841 Body Mass Index (BMI) 40.0 and over, adult: Secondary | ICD-10-CM

## 2023-08-28 MED ORDER — TIRZEPATIDE 2.5 MG/0.5ML ~~LOC~~ SOAJ: 2.5000 mg | SUBCUTANEOUS | 0 refills | Status: DC

## 2023-08-28 NOTE — Telephone Encounter (Signed)
 Pharmacy Patient Advocate Encounter  Received notification from [employer] that Prior Authorization for Mounjaro 2.5MG /0.5ML auto-injectors has been APPROVED from 08/28/23 to 08/27/24. Ran test claim, Copay is $25. This test claim was processed through [facility]- copay amounts may vary at other pharmacies due to pharmacy/plan contracts, or as the patient moves through the different stages of their insurance plan.   PA #/Case ID/Reference #: [id number]   *spoke with [facility] pharmacy

## 2023-08-28 NOTE — Patient Instructions (Signed)
 Start tirzepitide (Mounjaro) for diabetes/weight loss. Start by injecting 2.5 mg into the skin once weekly for 4 weeks. Please notify me once you've used your last 2.5 mg pen so that I can prescribe the next dose.   Keep follow up for 5/6. We can discuss more.   Keep me posted if it does not go through with insurance. We will fax your papers when they are filled out and keep you posted if there is any more questions.  It was a pleasure to see you today!

## 2023-08-28 NOTE — Progress Notes (Signed)
 Patient ID: ATHZIRY MILLICAN, female    DOB: 05/18/1970, 53 y.o.   MRN: [id number]  Virtual visit completed through [employer], a video enabled telemedicine application. Due to national recommendations of social distancing due to COVID-19, a virtual visit is felt to be most appropriate for this patient at this time. Reviewed limitations, risks, security and privacy concerns of performing a virtual visit and the availability of in person appointments. I also reviewed that there may be a patient responsible charge related to this service. The patient agreed to proceed.   Patient location: home Provider location: [place] [facility], office Persons participating in this virtual visit: patient, provider   If any vitals were documented, they were collected by patient at home unless specified below.    Ht 5\' 10"  (1.778 m)   Wt (!) 339 lb (153.8 kg)   LMP 01/30/2016 (Approximate)   BMI 48.64 kg/m    Subjective:   HPI: [name] is a 53 y.o. female presenting on 08/28/2023 for Forms (Patient needs visit to discuss accomodation form)  Limited mobility-ongoing.  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  She currently gets to 15-minute breaks and 130-minute lunch which is not enough for her to stand at, stretch or use the bathroom or eat.  She uses a rolling chair to get around her home and is usually in a wheelchair when she comes to the office.  She is constantly on the phone during her work hours other than when she is on a break.  She has talked to her manager who has agreed for an extra 30 minutes for her to daily up as needed throughout her 8-hour day.  She last saw the orthopedic last year prior to her losing her insurance who advised her that she would need to lose the weight prior to being eligible for knee replacements bilaterally.  She reports that her right knee is worse than the  left.  She understands that the effective date would be today and good for one year. She would like a referral to a new orthopedic.   Type 2 diabetes- has been taking her metformin daily. Given that she has type 2 diabetes and obesity, she is agreeable to starting GLP-1 medication.  She denies any known history of pancreatitis.  Denies any known family history of medullary thyroid cancer or any endocrine cancers.   Relevant past medical, surgical, family and social history reviewed and updated as indicated. Interim medical history since our last visit reviewed. Allergies and medications reviewed and updated. Outpatient Medications Prior to Visit  Medication Sig Dispense Refill   Blood Glucose Monitoring Suppl DEVI 1 each by Does not apply route in the morning, at noon, and at bedtime. May substitute to any manufacturer covered by patient's insurance. 1 each 0   furosemide (LASIX) 20 MG tablet Take 1 tablet (20 mg total) by mouth daily. 30 tablet 0   Glucose Blood (BLOOD GLUCOSE TEST STRIPS) STRP 1 each by In Vitro route in the morning, at noon, and at bedtime. May substitute to any manufacturer covered by patient's insurance. 90 each 0   Lancet Device MISC 1 each by Does not apply route in the morning, at noon, and at bedtime. May substitute to any manufacturer covered by patient's insurance. 1 each 0   Lancets Misc. MISC 1 each by Does not apply route in  the morning, at noon, and at bedtime. May substitute to any manufacturer covered by patient's insurance. 100 each 0   lisinopril (ZESTRIL) 5 MG tablet Take 1 tablet (5 mg total) by mouth daily. 30 tablet 0   losartan-hydrochlorothiazide (HYZAAR) 100-25 MG tablet Take 1 tablet by mouth daily. 30 tablet 0   metFORMIN (GLUCOPHAGE-XR) 500 MG 24 hr tablet Take 1 tablet (500 mg total) by mouth daily with breakfast. 90 tablet 0   ofloxacin (FLOXIN) 0.3 % OTIC solution Place 10 drops into both ears daily. For 7 days. 5 mL 0   traZODone (DESYREL) 50 MG  tablet Take 1 tablet (50 mg total) by mouth at bedtime. 30 tablet 0   Vitamin D, Ergocalciferol, (DRISDOL) 1.25 MG (50000 UNIT) CAPS capsule Take 1 capsule (50,000 Units total) by mouth every 7 (seven) days. 12 capsule 0   No facility-administered medications prior to visit.     Per HPI unless specifically indicated in ROS section below Review of Systems  Constitutional:  Negative for chills and fever.  Respiratory:  Negative for cough, choking and chest tightness.   Gastrointestinal:  Negative for blood in stool, constipation and diarrhea.  Genitourinary:  Negative for dysuria, frequency and urgency.  Neurological:  Negative for dizziness and headaches.  Psychiatric/Behavioral:  Negative for suicidal ideas. The patient is not nervous/anxious.    Objective:  Ht 5\' 10"  (1.778 m)   Wt (!) 339 lb (153.8 kg)   LMP 01/30/2016 (Approximate)   BMI 48.64 kg/m   Wt Readings from Last 3 Encounters:  08/28/23 (!) 339 lb (153.8 kg)  08/20/23 (!) 339 lb (153.8 kg)  08/06/23 (!) 351 lb (159.2 kg)       Physical exam: General: Alert and oriented x 3, no distress, does not appear sickly  Pulmonary: Speaks in complete sentences without increased work of breathing, no cough during visit.  Psychiatric: Normal mood, thought content, and behavior.     Results for orders placed or performed in visit on 08/20/23  Basic Metabolic Panel   Collection Time: 08/20/23 11:32 AM  Result Value Ref Range   Sodium 137 135 - 145 mEq/L   Potassium 4.4 3.5 - 5.1 mEq/L   Chloride 99 96 - 112 mEq/L   CO2 28 19 - 32 mEq/L   Glucose, Bld 127 (H) 70 - 99 mg/dL   BUN 12 6 - 23 mg/dL   Creatinine, Ser 1.30 0.40 - 1.20 mg/dL   GFR 86.57 >84.69 mL/min   Calcium 9.6 8.4 - 10.5 mg/dL   Assessment & Plan:   Problem List Items Addressed This Visit       Endocrine   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)   Start Mounjaro 2.5 mg once weekly.  Rx sent. Continue metformin 500 mg once  daily.  Continue checking blood sugar at home and bring log back to the appointment scheduled in May.      Relevant Medications   tirzepatide (MOUNJARO) 2.5 MG/0.5ML Pen     Musculoskeletal and Integument   Osteoarthritis of knee   Uncontrolled.  Referral placed for orthopedics.  Given the severity of her osteoarthritis she is in need for accommodations for her job requiring longer breaks.  We have received the forms and will fax with the information provided today from the patient.  Discussed the importance of the weight management to be a candidate for her surgery.  Agreeable to start GLP-1 which will help with her weight loss and her diabetes.  She has been following with the healthy weight and wellness.      Relevant Orders   Ambulatory referral to Orthopedics     Other   Class 3 severe obesity due to excess calories with serious comorbidity and body mass index (BMI) of 50.0 to 59.9 in adult [facility]) - Primary   She has been trying to monitor her diet and decrease her soda intake.  She has seen healthy weight and wellness. Given her type 2 diabetes diagnosis and the need for knee replacement surgery and her obesity, she is agreeable to start GLP-1.  Follow-up in May as scheduled.      Relevant Medications   tirzepatide (MOUNJARO) 2.5 MG/0.5ML Pen   Other Relevant Orders   Ambulatory referral to Orthopedics     Meds ordered this encounter  Medications   tirzepatide (MOUNJARO) 2.5 MG/0.5ML Pen    Sig: Inject 2.5 mg into the skin once a week.    Dispense:  2 mL    Refill:  0    Supervising Provider:   Scherrie Curt [[id number]]   Orders Placed This Encounter  Procedures   Ambulatory referral to Orthopedics    Referral Priority:   Routine    Referral Type:   Consultation    Number of Visits Requested:   1    I discussed the assessment and treatment plan with the patient. The patient was provided an opportunity to ask questions and all were answered. The patient agreed  with the plan and demonstrated an understanding of the instructions. The patient was advised to call back or seek an in-person evaluation if the symptoms worsen or if the condition fails to improve as anticipated.  Follow up plan:  Return if symptoms worsen or fail to improve.  Jolanda Nation, NP

## 2023-08-28 NOTE — Assessment & Plan Note (Signed)
 Start Mounjaro 2.5 mg once weekly.  Rx sent. Continue metformin 500 mg once daily.  Continue checking blood sugar at home and bring log back to the appointment scheduled in May.

## 2023-08-28 NOTE — Assessment & Plan Note (Signed)
 Uncontrolled.  Referral placed for orthopedics.  Given the severity of her osteoarthritis she is in need for accommodations for her job requiring longer breaks.  We have received the forms and will fax with the information provided today from the patient.  Discussed the importance of the weight management to be a candidate for her surgery.  Agreeable to start GLP-1 which will help with her weight loss and her diabetes.  She has been following with the healthy weight and wellness.

## 2023-08-28 NOTE — Assessment & Plan Note (Signed)
 She has been trying to monitor her diet and decrease her soda intake.  She has seen healthy weight and wellness. Given her type 2 diabetes diagnosis and the need for knee replacement surgery and her obesity, she is agreeable to start GLP-1.  Follow-up in May as scheduled.

## 2023-08-28 NOTE — Telephone Encounter (Signed)
 Pharmacy Patient Advocate Encounter   Received notification from Onbase that prior authorization for Mounjaro 2.5MG /0.5ML auto-injectors is required/requested.   Insurance verification completed.   The patient is insured through [employer] .   Per test claim: PA required; PA submitted to above mentioned insurance via CoverMyMeds Key/confirmation #/EOC [id number] Status is pending

## 2023-09-02 NOTE — Telephone Encounter (Signed)
 Paperwork completed, faxed, copy sent to scan, and copy mailed per patient request.

## 2023-09-04 ENCOUNTER — Telehealth: Payer: Self-pay

## 2023-09-04 NOTE — Progress Notes (Signed)
 Complex Care Management Note  Care Guide Note 09/04/2023 Name: [name] MRN: [id number] DOB: 10-12-1970  [name] is a 53 y.o. year old female who sees Alicia Nation, NP for primary care. I reached out to [name] by phone today to offer complex care management services.  [name] was given information about Complex Care Management services today including:   The Complex Care Management services include support from the care team which includes your Nurse Care Manager, Clinical Social Worker, or Pharmacist.  The Complex Care Management team is here to help remove barriers to the health concerns and goals most important to you. Complex Care Management services are voluntary, and the patient may decline or stop services at any time by request to their care team member.   Complex Care Management Consent Status: Patient did not agree to participate in complex care management services at this time.  Follow up plan:  Pt declined   Encounter Outcome:  Patient Refused  Lenton Rail , RMA     [facility]  [facility] Guide  Direct Dial: [phone]  Website: [email or web address]

## 2023-09-04 NOTE — Progress Notes (Signed)
 Complex Care Management Note Care Guide Note  09/04/2023 Name: [name] MRN: [id number] DOB: 08/29/1970   Complex Care Management Outreach Attempts: An unsuccessful telephone outreach was attempted today to offer the patient information about available complex care management services.  Follow Up Plan:  Additional outreach attempts will be made to offer the patient complex care management information and services.   Encounter Outcome:  No Answer  [name]  [facility] Guide Direct Dial: [phone]  Fax: [phone] Website: [email or web address]

## 2023-09-05 ENCOUNTER — Telehealth: Payer: Self-pay

## 2023-09-05 NOTE — Progress Notes (Signed)
 Complex Care Management Note Care Guide Note  09/05/2023 Name: [name] MRN: [id number] DOB: February 03, 1971  [name] is a 53 y.o. year old female who is a primary care patient of Jolanda Nation, NP . The community resource team was consulted for assistance with Food Insecurity  SDOH screenings and interventions completed:  Yes  Social Drivers of Health From This Encounter   Food Insecurity: Patient Unable To Answer (09/05/2023)   Hunger Vital Sign    Worried About Running Out of Food in the Last Year: Patient unable to answer    Ran Out of Food in the Last Year: Patient unable to answer  Housing: Patient Unable To Answer (09/05/2023)   Housing Stability Vital Sign    Unable to Pay for Housing in the Last Year: Patient unable to answer    Number of Times Moved in the Last Year: 0    Homeless in the Last Year: Patient unable to answer  Financial Resource Strain: Patient Unable To Answer (09/05/2023)   Overall Financial Resource Strain (CARDIA)    Difficulty of Paying Living Expenses: Patient unable to answer  Transportation Needs: No Transportation Needs (09/05/2023)   PRAPARE - Administrator, Civil Service (Medical): No    Lack of Transportation (Non-Medical): No  Utilities: Patient Unable To Answer (09/05/2023)   Utilities    Threatened with loss of utilities: Patient unable to answer    SDOH Interventions Today    Flowsheet Row Most Recent Value  SDOH Interventions   Food Insecurity Interventions Patient Unable to Answer  Housing Interventions Patient Unable to Answer  Transportation Interventions Other (Comment)  [Per patient request verified email address, emailed LINK Paratransit Application.]  Utilities Interventions Patient Unable to Answer  Financial Strain Interventions Patient Unable to Answer        Care guide performed the following interventions: Spoke with patient she was unable to talk because she was at work and requested that I email a Financial planner. No other resources are needed at this time.   Follow Up Plan:  No further follow up planned at this time. The patient has been provided with needed resources.  Encounter Outcome:  Patient Visit Completed  [facility]  [facility] Guide Direct Dial: [phone]  Fax: [phone] Website: [email or web address]

## 2023-09-17 ENCOUNTER — Ambulatory Visit (INDEPENDENT_AMBULATORY_CARE_PROVIDER_SITE_OTHER)
Admission: RE | Admit: 2023-09-17 | Discharge: 2023-09-17 | Disposition: A | Discharge status: Home / Self Care | Source: Ambulatory Visit | Attending: General Practice | Admitting: General Practice

## 2023-09-17 ENCOUNTER — Ambulatory Visit (INDEPENDENT_AMBULATORY_CARE_PROVIDER_SITE_OTHER): Admitting: General Practice

## 2023-09-17 ENCOUNTER — Encounter: Payer: Self-pay | Admitting: General Practice

## 2023-09-17 ENCOUNTER — Telehealth: Payer: Self-pay

## 2023-09-17 VITALS — BP 138/94 | HR 108 | Temp 99.2°F | Ht 70.0 in | Wt 345.0 lb

## 2023-09-17 DIAGNOSIS — Z720 Tobacco use: Secondary | ICD-10-CM

## 2023-09-17 DIAGNOSIS — E1122 Type 2 diabetes mellitus with diabetic chronic kidney disease: Secondary | ICD-10-CM

## 2023-09-17 DIAGNOSIS — R0602 Shortness of breath: Secondary | ICD-10-CM | POA: Diagnosis not present

## 2023-09-17 DIAGNOSIS — I10 Essential (primary) hypertension: Secondary | ICD-10-CM | POA: Diagnosis not present

## 2023-09-17 DIAGNOSIS — Z7985 Long-term (current) use of injectable non-insulin antidiabetic drugs: Secondary | ICD-10-CM

## 2023-09-17 DIAGNOSIS — H6063 Unspecified chronic otitis externa, bilateral: Secondary | ICD-10-CM

## 2023-09-17 DIAGNOSIS — N181 Chronic kidney disease, stage 1: Secondary | ICD-10-CM | POA: Diagnosis not present

## 2023-09-17 DIAGNOSIS — R300 Dysuria: Secondary | ICD-10-CM | POA: Insufficient documentation

## 2023-09-17 DIAGNOSIS — Z139 Encounter for screening, unspecified: Secondary | ICD-10-CM

## 2023-09-17 LAB — BASIC METABOLIC PANEL WITH GFR
BUN: 10 mg/dL (ref 6–23)
CO2: 27 meq/L (ref 19–32)
Calcium: 9.3 mg/dL (ref 8.4–10.5)
Chloride: 99 meq/L (ref 96–112)
Creatinine, Ser: 0.82 mg/dL (ref 0.40–1.20)
GFR: 82.21 mL/min (ref >60.00)
Glucose, Bld: 98 mg/dL (ref 70–99)
Potassium: 4.1 meq/L (ref 3.5–5.1)
Sodium: 134 meq/L — ABNORMAL LOW (ref 135–145)

## 2023-09-17 LAB — POC URINALSYSI DIPSTICK (AUTOMATED)
Bilirubin, UA: NEGATIVE
Blood, UA: 80
Glucose, UA: NEGATIVE
Ketones, UA: NEGATIVE
Leukocytes, UA: NEGATIVE
Nitrite, UA: NEGATIVE
Protein, UA: POSITIVE — AB
Spec Grav, UA: 1.02 (ref 1.010–1.025)
Urobilinogen, UA: 1 U/dL
pH, UA: 6 (ref 5.0–8.0)

## 2023-09-17 LAB — CBC WITH DIFFERENTIAL/PLATELET
Basophils Absolute: 0.1 10*3/uL (ref 0.0–0.1)
Basophils Relative: 0.5 % (ref 0.0–3.0)
Eosinophils Absolute: 0.1 10*3/uL (ref 0.0–0.7)
Eosinophils Relative: 0.7 % (ref 0.0–5.0)
HCT: 45.3 % (ref 36.0–46.0)
Hemoglobin: 15.1 g/dL — ABNORMAL HIGH (ref 12.0–15.0)
Lymphocytes Relative: 18.2 % (ref 12.0–46.0)
Lymphs Abs: 2.3 10*3/uL (ref 0.7–4.0)
MCHC: 33.3 g/dL (ref 30.0–36.0)
MCV: 90.2 fl (ref 78.0–100.0)
Monocytes Absolute: 0.6 10*3/uL (ref 0.1–1.0)
Monocytes Relative: 4.8 % (ref 3.0–12.0)
Neutro Abs: 9.5 10*3/uL — ABNORMAL HIGH (ref 1.4–7.7)
Neutrophils Relative %: 75.8 % (ref 43.0–77.0)
Platelets: 358 10*3/uL (ref 150.0–400.0)
RBC: 5.02 Mil/uL (ref 3.87–5.11)
RDW: 14.2 % (ref 11.5–15.5)
WBC: 12.6 10*3/uL — ABNORMAL HIGH (ref 4.0–10.5)

## 2023-09-17 LAB — MICROALBUMIN / CREATININE URINE RATIO
Creatinine,U: 155.8 mg/dL
Microalb Creat Ratio: 162.5 mg/g — ABNORMAL HIGH (ref 0.0–30.0)
Microalb, Ur: 25.3 mg/dL — ABNORMAL HIGH (ref 0.0–1.9)

## 2023-09-17 MED ORDER — VALSARTAN-HYDROCHLOROTHIAZIDE 80-12.5 MG PO TABS: 1.0000 | ORAL_TABLET | Freq: Every day | ORAL | 0 refills | Status: DC

## 2023-09-17 MED ORDER — TIRZEPATIDE 5 MG/0.5ML ~~LOC~~ SOAJ: 5.0000 mg | SUBCUTANEOUS | 0 refills | Status: DC

## 2023-09-17 MED ORDER — ALBUTEROL SULFATE HFA 108 (90 BASE) MCG/ACT IN AERS: 2.0000 | INHALATION_SPRAY | Freq: Four times a day (QID) | RESPIRATORY_TRACT | 1 refills | Status: DC | PRN

## 2023-09-17 NOTE — Assessment & Plan Note (Signed)
 Patient has declined case management and social work resources.

## 2023-09-17 NOTE — Assessment & Plan Note (Signed)
 No improvement. Referral placed for ENT.

## 2023-09-17 NOTE — Progress Notes (Signed)
 Established Patient Office Visit  Subjective   Patient ID: [name], female    DOB: 09-18-1970  Age: 53 y.o. MRN: [id number]  Chief Complaint  Patient presents with   Diabetes    Sugars have been running good per patient.    Hypertension    Patient states BP has still been elevated after getting new cuff.    Dysuria    Patient has had freq, urgency and pressure when she urinates x couple days.     HPI  [name] is a 53 year old female with past medical history of several OA of knees, type 2 DM, HTN, tobacco abuse presents today for a follow up.   HTN: currently managed on losartan -hydrochlorothiazide 100-25 mg once daily and lasix  20 mg once daily and lisinopril  5 mg once daily. She has been checking her BP at home and reports readings of systolic 180s-200s and diastolic in 100s. She has been taking her medication without missing any doses. She reports increased amount of stress due to her accommodations being denied at work. She has been smoking a lot lately. She denies any chest pain, blurred vision or headaches.   DM2: she was started on Mounjaro on 08/28/23. She has not been checking her blood glucose at home. She reports that appetite has suppressed even though she has gained weight. She states that she has been watching her diet but work has her stressed out. She has gained about 6 lbs since her last visit.   Dysuria: symptom onset couple days ago with increased frequency, urgency and pressure and right sided flank pain. No blood in urine or vaginal symptoms. No fever, chill, nausea, or vomiting.  Shortness of breath: has been a smoker for thirty years. Suspect she may have COPD. She reports wheezing. She has used albuterol  and another inhaler in the past. She denies any fever or chills.   Patient Active Problem List   Diagnosis Date Noted   Dysuria 09/17/2023   Shortness of breath 09/17/2023   Chronic otitis externa of both ears 08/20/2023   Encounter for  screening involving social determinants of health (SDoH) 08/20/2023   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) 08/15/2023   Vitamin D  deficiency 08/15/2023   Insomnia 08/06/2023   Class 3 severe obesity due to excess calories with serious comorbidity and body mass index (BMI) of 50.0 to 59.9 in adult 08/06/2023   Continuous tobacco abuse 08/06/2023   Osteoarthritis of knee 08/05/2023   Essential hypertension 04/12/2017   Past Medical History:  Diagnosis Date   Anxiety    Arthritis    Cholecystitis with cholelithiasis 02/25/2013   Complication of anesthesia    woke up crying after tubes tied, anxiety attack   Depression    Depression 04/12/2017   GERD (gastroesophageal reflux disease)    Hypertension    Migraine    no longer have these   MRSA carrier    Past Surgical History:  Procedure Laterality Date   CHOLECYSTECTOMY     RADIOLOGY WITH ANESTHESIA Bilateral 08/08/2021   Procedure: MRI WITH BILATERAL HIPWITHOUT CONTRAST,RIGHT  KNEE WITHOUT CONTRAST;  Surgeon: Radiologist, Medication, MD;  Location: [facility] OR;  Service: Radiology;  Laterality: Bilateral;   TONSILLECTOMY     TUBAL LIGATION     No Known Allergies       09/17/2023   11:43 AM 08/20/2023   11:02 AM 08/06/2023    2:00 PM  Depression screen PHQ 2/9  Decreased Interest  2 1 0  Down, Depressed, Hopeless 2 1 0  PHQ - 2 Score 4 2 0  Altered sleeping 2 2 0  Tired, decreased energy 2 1 0  Change in appetite 2 0 0  Feeling bad or failure about yourself  0 1 0  Trouble concentrating 2 0 0  Moving slowly or fidgety/restless 0 0 0  Suicidal thoughts 0 0 0  PHQ-9 Score 12 6 0  Difficult doing work/chores Somewhat difficult Very difficult Not difficult at all       09/17/2023   11:43 AM 08/20/2023   11:02 AM 08/06/2023    2:00 PM  GAD 7 : Generalized Anxiety Score  Nervous, Anxious, on Edge 2 1 0  Control/stop worrying 2 2 0  Worry too much - different things 2 2 0  Trouble relaxing 3 2 0   Restless 0 0 0  Easily annoyed or irritable 2 2 0  Afraid - awful might happen 0 2 0  Total GAD 7 Score 11 11 0  Anxiety Difficulty Somewhat difficult Very difficult Not difficult at all      ROS    Objective:     BP (!) 138/94 (BP Location: Left Arm, Patient Position: Sitting, Cuff Size: Large)   Pulse (!) 108   Temp 99.2 F (37.3 C) (Oral)   Ht 5\' 10"  (1.778 m)   Wt (!) 345 lb (156.5 kg)   LMP 01/30/2016 (Approximate)   SpO2 98%   BMI 49.50 kg/m  BP Readings from Last 3 Encounters:  09/17/23 (!) 138/94  08/20/23 (!) 120/90  08/06/23 (!) 184/110   Wt Readings from Last 3 Encounters:  09/17/23 (!) 345 lb (156.5 kg)  08/28/23 (!) 339 lb (153.8 kg)  08/20/23 (!) 339 lb (153.8 kg)      Physical Exam   Results for orders placed or performed in visit on 09/17/23  POCT Urinalysis Dipstick (Automated)  Result Value Ref Range   Color, UA amber    Clarity, UA cloudy    Glucose, UA Negative Negative   Bilirubin, UA neg    Ketones, UA neg    Spec Grav, UA 1.020 1.010 - 1.025   Blood, UA 80 Ery/uL    pH, UA 6.0 5.0 - 8.0   Protein, UA Positive (A) Negative   Urobilinogen, UA 1.0 0.2 or 1.0 E.U./dL   Nitrite, UA neg    Leukocytes, UA Negative Negative       The ASCVD Risk score (Arnett DK, et al., 2019) failed to calculate for the following reasons:   The valid total cholesterol range is 130 to 320 mg/dL    Assessment & Plan:  Essential hypertension Assessment & Plan: Uncontrolled.  Home readings continue to be elevated.  Exam stable.  Stopped losartan  hydrochlorothiazide 100-25 mg tablets. Start valsartan hydrochlorothiazide 80-25 milligram once daily.  Rx sent.  She will send me her blood pressure readings in 1 week via MyChart.  Follow-up in office in 2 weeks.  Orders: -     Valsartan-hydroCHLOROthiazide; Take 1 tablet by mouth daily.  Dispense: 30 tablet; Refill: 0  Type 2 diabetes mellitus with stage 1 chronic kidney disease, unspecified whether  long term insulin use (HCC) Assessment & Plan: Reports that she has been doing great on Mounjaro.  Has 1 shot left for the 2.5 mg dose.  Agreeable to titrate up to 5 mg.  Rx sent for Mounjaro 5 mg once weekly. Urine ACR pending.  BMP pending.  Orders: -  Microalbumin / creatinine urine ratio -     Tirzepatide; Inject 5 mg into the skin once a week.  Dispense: 2 mL; Refill: 0  Dysuria Assessment & Plan: POC urinalysis negative for leukocytes, nitrites.  Slight blood and protein. Urine culture pending.  ER precautions provided and verbalizes understanding. Await results. Discussed increasing water intake, avoiding spicy foods and caffeinated drinks at this time.  Orders: -     POCT Urinalysis Dipstick (Automated) -     Urine Culture -     Basic metabolic panel with GFR -     CBC with Differential/Platelet  Chronic otitis externa of both ears, unspecified type Assessment & Plan: No improvement. Referral placed for ENT.  Orders: -     Ambulatory referral to ENT  Shortness of breath Assessment & Plan: Unclear etiology however suspect could be asthma or COPD. Stat chest x-ray pending.  Rx sent for albuterol . Will send maintenance inhaler once the chest x-ray results come back. Exam stable.  Stable for outpatient treatment.  Orders: -     DG Chest 2 View -     Albuterol  Sulfate HFA; Inhale 2 puffs into the lungs every 6 (six) hours as needed for wheezing or shortness of breath.  Dispense: 8 g; Refill: 1  Continuous tobacco abuse Assessment & Plan: Smoking cessation instruction/counseling given:  counseled patient on the dangers of tobacco use, advised patient to stop smoking, and reviewed strategies to maximize success    Encounter for screening involving social determinants of health [facility]) Assessment & Plan: Patient has declined case management and social work resources.     Return in about 2 weeks (around 10/01/2023) for Blood pressure.    Alicia Nation,  NP

## 2023-09-17 NOTE — Assessment & Plan Note (Signed)
 Reports that she has been doing great on Mounjaro.  Has 1 shot left for the 2.5 mg dose.  Agreeable to titrate up to 5 mg.  Rx sent for Mounjaro 5 mg once weekly. Urine ACR pending.  BMP pending.

## 2023-09-17 NOTE — Patient Instructions (Addendum)
 Stop losartan -hydrochlorothiazide and start Valsartan-hydrochlorothiazide. Please send me your blood pressure readings with in a week.   Increase mounjaro to 5mg  once weekly.   Complete xray(s) prior to leaving today. I will notify you of your results once received. Stop by the lab prior to leaving today. I will notify you of your results once received.   I will be in touch once I get urine culture results.   You will either be contacted via phone regarding your referral to ENT , or you may receive a letter on your MyChart portal from our referral team with instructions for scheduling an appointment. Please let us  know if you have not been contacted by anyone within two weeks.  Follow up in 2 weeks.   It was a pleasure to see you today!

## 2023-09-17 NOTE — Assessment & Plan Note (Signed)
 Smoking cessation instruction/counseling given:  counseled patient on the dangers of tobacco use, advised patient to stop smoking, and reviewed strategies to maximize success

## 2023-09-17 NOTE — Assessment & Plan Note (Addendum)
 Uncontrolled.  Home readings continue to be elevated.  Exam stable.  Stopped losartan  hydrochlorothiazide 100-25 mg tablets. Start valsartan hydrochlorothiazide 80-25 milligram once daily.  Rx sent.  She will send me her blood pressure readings in 1 week via MyChart.  Follow-up in office in 2 weeks.

## 2023-09-17 NOTE — Assessment & Plan Note (Signed)
 Unclear etiology however suspect could be asthma or COPD. Stat chest x-ray pending.  Rx sent for albuterol . Will send maintenance inhaler once the chest x-ray results come back. Exam stable.  Stable for outpatient treatment.

## 2023-09-17 NOTE — Telephone Encounter (Signed)
 Copied from CRM [id number]. Topic: General - Other >> Sep 17, 2023  8:26 AM Dorisann Garre T wrote: Reason for CRM: patient is calling in to see if her copay and be waived until Thursday when she gets paid she has a appt today at 11 she would like a call back to make sure that is ok

## 2023-09-17 NOTE — Assessment & Plan Note (Signed)
 POC urinalysis negative for leukocytes, nitrites.  Slight blood and protein. Urine culture pending.  ER precautions provided and verbalizes understanding. Await results. Discussed increasing water intake, avoiding spicy foods and caffeinated drinks at this time.

## 2023-09-18 ENCOUNTER — Encounter: Payer: Self-pay | Admitting: General Practice

## 2023-09-18 ENCOUNTER — Other Ambulatory Visit: Payer: Self-pay | Admitting: General Practice

## 2023-09-18 DIAGNOSIS — R0602 Shortness of breath: Secondary | ICD-10-CM

## 2023-09-18 DIAGNOSIS — Z720 Tobacco use: Secondary | ICD-10-CM

## 2023-09-19 ENCOUNTER — Other Ambulatory Visit: Payer: Self-pay | Admitting: General Practice

## 2023-09-19 DIAGNOSIS — B9620 Unspecified Escherichia coli [E. coli] as the cause of diseases classified elsewhere: Secondary | ICD-10-CM

## 2023-09-19 LAB — URINE CULTURE
MICRO NUMBER:: 16419273
SPECIMEN QUALITY:: ADEQUATE

## 2023-09-19 MED ORDER — NITROFURANTOIN MONOHYD MACRO 100 MG PO CAPS: 100.0000 mg | ORAL_CAPSULE | Freq: Two times a day (BID) | ORAL | 0 refills | Status: AC

## 2023-10-01 ENCOUNTER — Ambulatory Visit: Admitting: General Practice

## 2023-10-01 NOTE — Telephone Encounter (Signed)
Copay waived

## 2023-10-02 ENCOUNTER — Encounter: Payer: Self-pay | Admitting: General Practice

## 2023-10-08 ENCOUNTER — Encounter (INDEPENDENT_AMBULATORY_CARE_PROVIDER_SITE_OTHER): Payer: Self-pay | Admitting: Physician Assistant

## 2023-10-08 ENCOUNTER — Other Ambulatory Visit (HOSPITAL_COMMUNITY)
Admission: RE | Admit: 2023-10-08 | Discharge: 2023-10-08 | Disposition: A | Discharge status: Home / Self Care | Source: Other Acute Inpatient Hospital | Attending: Physician Assistant | Admitting: Physician Assistant

## 2023-10-08 ENCOUNTER — Ambulatory Visit (INDEPENDENT_AMBULATORY_CARE_PROVIDER_SITE_OTHER): Admitting: Physician Assistant

## 2023-10-08 VITALS — BP 186/116 | HR 98 | Ht 70.0 in | Wt 300.0 lb

## 2023-10-08 DIAGNOSIS — J3489 Other specified disorders of nose and nasal sinuses: Secondary | ICD-10-CM | POA: Diagnosis not present

## 2023-10-08 DIAGNOSIS — H6090 Unspecified otitis externa, unspecified ear: Secondary | ICD-10-CM | POA: Diagnosis present

## 2023-10-08 DIAGNOSIS — H60313 Diffuse otitis externa, bilateral: Secondary | ICD-10-CM

## 2023-10-08 MED ORDER — CIPROFLOXACIN-DEXAMETHASONE 0.3-0.1 % OT SUSP: 4.0000 [drp] | Freq: Two times a day (BID) | OTIC | 0 refills | Status: AC

## 2023-10-08 NOTE — Progress Notes (Unsigned)
 Dear Dr. Deborra Falter, Here is my assessment for our mutual patient, [name]. Thank you for allowing me the opportunity to care for your patient. Please do not hesitate to contact me should you have any other questions. Sincerely, Belma Boxer PA-C  Otolaryngology Clinic Note Referring provider: Dr. Deborra Falter HPI:  [name] is a 53 y.o. female kindly referred by Dr. Deborra Falter   The patient is a 53 year old female seen in our office for evaluation of otitis externa.  The patient notes that for the last 6 months she has had off-and-on episodes of bilateral otitis externa.  She notes some malodorous discharge from her ears bilateral, dryness, irritation, and the sensation of congestion in her ears.  She has been seen numerous times and tried numerous topical antibiotics.  She notes that the symptoms seem to improve but do not completely resolved.  She denies any trauma to the ear, no history of recurrent infections previously.  No head or neck surgeries.  He was originally referred to ENT when the symptoms started but did not have insurance and could not afford out-of-pocket.  She does note a history of diabetes which is well-controlled now, she is on Mounjaro.  The last otic medication she used was ofloxacin  which she notes did somewhat improve her symptoms.  She denies any baseline hearing loss prior to this but notes she does have difficulty hearing intermittently given the discharge in her ears.  She currently uses triple antibiotic ointment on her external ears to prevent any irritation from the discharge.  She also notes over the last 6 months she has had nasal congestion, no history of seasonal allergies prior to this.  She is currently using Claritin which seems to help her symptoms.  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  She does no longer use any drugs.    Independent Review of Additional Tests or Records:   08/06/2023 A1c 7.2   09/17/2023 CBG 98    PMH/Meds/All/SocHx/FamHx/ROS:   Past  Medical History:  Diagnosis Date   Anxiety    Arthritis    Cholecystitis with cholelithiasis 02/25/2013   Complication of anesthesia    woke up crying after tubes tied, anxiety attack   Depression    Depression 04/12/2017   GERD (gastroesophageal reflux disease)    Hypertension    Migraine    no longer have these   MRSA carrier      Past Surgical History:  Procedure Laterality Date   CHOLECYSTECTOMY     RADIOLOGY WITH ANESTHESIA Bilateral 08/08/2021   Procedure: MRI WITH BILATERAL HIPWITHOUT CONTRAST,RIGHT  KNEE WITHOUT CONTRAST;  Surgeon: Radiologist, Medication, MD;  Location: [facility] OR;  Service: Radiology;  Laterality: Bilateral;   TONSILLECTOMY     TUBAL LIGATION      History reviewed. No pertinent family history.   Social Connections: Unknown (09/24/2021)   Received from [facility]   Social Network    Social Network: Not on file      Current Outpatient Medications:    Accu-Chek Softclix Lancets lancets, 1 each 3 (three) times daily., Disp: , Rfl:    albuterol  (VENTOLIN  HFA) 108 (90 Base) MCG/ACT inhaler, Inhale 2 puffs into the lungs every 6 (six) hours as needed for wheezing or shortness of breath., Disp: 8 g, Rfl: 1   Blood Glucose Monitoring Suppl DEVI, 1 each by Does not apply route in the morning, at noon, and at bedtime. May substitute to any manufacturer covered by patient's insurance., Disp: 1 each, Rfl:  0   furosemide  (LASIX ) 20 MG tablet, Take 1 tablet (20 mg total) by mouth daily., Disp: 30 tablet, Rfl: 0   metFORMIN  (GLUCOPHAGE -XR) 500 MG 24 hr tablet, Take 1 tablet (500 mg total) by mouth daily with breakfast., Disp: 90 tablet, Rfl: 0   ofloxacin  (FLOXIN ) 0.3 % OTIC solution, Place 10 drops into both ears daily. For 7 days., Disp: 5 mL, Rfl: 0   tirzepatide (MOUNJARO) 5 MG/0.5ML Pen, Inject 5 mg into the skin once a week., Disp: 2 mL, Rfl: 0   traZODone  (DESYREL ) 50 MG tablet, Take 1 tablet (50 mg total) by mouth at bedtime., Disp: 30 tablet,  Rfl: 0   valsartan -hydrochlorothiazide  (DIOVAN -HCT) 80-12.5 MG tablet, Take 1 tablet by mouth daily., Disp: 30 tablet, Rfl: 0   Vitamin D , Ergocalciferol , (DRISDOL ) 1.25 MG (50000 UNIT) CAPS capsule, Take 1 capsule (50,000 Units total) by mouth every 7 (seven) days., Disp: 12 capsule, Rfl: 0   Physical Exam:   BP (!) 186/116   Pulse 98   Ht 5\' 10"  (1.778 m)   Wt 300 lb (136.1 kg)   LMP 01/30/2016 (Approximate)   SpO2 95%   BMI 43.05 kg/m   Pertinent Findings  CN II-XII intact Bilateral EAC with swelling, purulent discharge, no redness of pain of the external ear. TM intact bilateral  Weber 512: equal Rinne 512: AC > BC b/l  Anterior rhinoscopy: Septum midline with central perforation; bilateral inferior turbinates with minimal hypertrophy  No lesions of oral cavity/oropharynx; dentures  No obviously palpable neck masses/lymphadenopathy/thyromegaly No respiratory distress or stridor No pain with palpation of neck or mastoid region   Seprately Identifiable Procedures:  None  Impression & Plans:  [name] is a 53 y.o. female with the following   Otitis externa-  This is a 35 YOF seen today for otitis externa. She has used several rounds of otic medications that seem to improve her symptoms but fail to completely resolve the issue. Today she has findings consistent with otitis externa; no signs of severe disease. I would recommend started Ciprodex today. I will send in a prescription to the compounding pharmacy for CASH powder. She will use this once it arrives. I would like to see her back in the office in 2 weeks for repeat evaluation or sooner as needed.  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  She does have a history of nasal trauma no broken bones, cannot rule out previous septal hematoma.  This does not seem to bother her, no significant structural instability of the nose.  No indication for management at this time.  - f/u 2 weeks in the office for  repeat evaluation, sooner as needed    Thank you for allowing me the opportunity to care for your patient. Please do not hesitate to contact me should you have any other questions.  Sincerely, Belma Boxer PA-C [facility] Phone: [phone] Fax: [phone]  10/08/2023, 10:41 AM

## 2023-10-13 LAB — AEROBIC/ANAEROBIC CULTURE W GRAM STAIN (SURGICAL/DEEP WOUND): Gram Stain: NONE SEEN

## 2023-10-22 ENCOUNTER — Ambulatory Visit (INDEPENDENT_AMBULATORY_CARE_PROVIDER_SITE_OTHER): Admitting: Physician Assistant

## 2023-10-23 ENCOUNTER — Other Ambulatory Visit: Payer: Self-pay | Admitting: General Practice

## 2023-10-23 DIAGNOSIS — E1122 Type 2 diabetes mellitus with diabetic chronic kidney disease: Secondary | ICD-10-CM

## 2023-10-23 DIAGNOSIS — I10 Essential (primary) hypertension: Secondary | ICD-10-CM

## 2023-10-23 NOTE — Telephone Encounter (Signed)
 Last OV 09-17-23 No Future OV

## 2023-10-24 NOTE — Telephone Encounter (Signed)
 Left detailed message on VM per DPR for pt to call the office and make appt.

## 2023-10-30 ENCOUNTER — Other Ambulatory Visit: Payer: Self-pay | Admitting: General Practice

## 2023-10-30 DIAGNOSIS — R0602 Shortness of breath: Secondary | ICD-10-CM

## 2023-11-12 ENCOUNTER — Ambulatory Visit: Admitting: Pulmonary Disease

## 2023-12-20 ENCOUNTER — Ambulatory Visit: Admitting: General Practice

## 2023-12-20 ENCOUNTER — Other Ambulatory Visit (HOSPITAL_COMMUNITY): Payer: Self-pay

## 2023-12-20 ENCOUNTER — Encounter: Payer: Self-pay | Admitting: General Practice

## 2023-12-20 VITALS — BP 174/96 | HR 104 | Temp 98.6°F | Ht 70.0 in | Wt 343.5 lb

## 2023-12-20 DIAGNOSIS — Z23 Encounter for immunization: Secondary | ICD-10-CM

## 2023-12-20 DIAGNOSIS — I10 Essential (primary) hypertension: Secondary | ICD-10-CM | POA: Diagnosis not present

## 2023-12-20 DIAGNOSIS — R0602 Shortness of breath: Secondary | ICD-10-CM

## 2023-12-20 DIAGNOSIS — G4700 Insomnia, unspecified: Secondary | ICD-10-CM

## 2023-12-20 DIAGNOSIS — E66813 Obesity, class 3: Secondary | ICD-10-CM | POA: Diagnosis not present

## 2023-12-20 DIAGNOSIS — E1122 Type 2 diabetes mellitus with diabetic chronic kidney disease: Secondary | ICD-10-CM | POA: Diagnosis not present

## 2023-12-20 DIAGNOSIS — N181 Chronic kidney disease, stage 1: Secondary | ICD-10-CM

## 2023-12-20 DIAGNOSIS — Z6841 Body Mass Index (BMI) 40.0 and over, adult: Secondary | ICD-10-CM

## 2023-12-20 MED ORDER — METFORMIN HCL ER 500 MG PO TB24: 500.0000 mg | ORAL_TABLET | Freq: Every day | ORAL | 1 refills | Status: DC

## 2023-12-20 MED ORDER — VALSARTAN-HYDROCHLOROTHIAZIDE 80-12.5 MG PO TABS: 1.0000 | ORAL_TABLET | Freq: Every day | ORAL | 1 refills | Status: DC

## 2023-12-20 MED ORDER — MOUNJARO 5 MG/0.5ML ~~LOC~~ SOAJ: 5.0000 mg | SUBCUTANEOUS | 0 refills | Status: DC

## 2023-12-20 MED ORDER — TRAZODONE HCL 50 MG PO TABS: 50.0000 mg | ORAL_TABLET | Freq: Every day | ORAL | 0 refills | Status: DC

## 2023-12-20 MED ORDER — FUROSEMIDE 20 MG PO TABS: 20.0000 mg | ORAL_TABLET | Freq: Every day | ORAL | 1 refills | Status: DC

## 2023-12-20 NOTE — Progress Notes (Signed)
 Established Patient Office Visit  Subjective   Patient ID: [name], female    DOB: 1970-08-18  Age: 53 y.o. MRN: [id number]  Chief Complaint  Patient presents with  . Medical Management of Chronic Issues    Here for HTN f/u.    HPI  [name] is a 53 year old female with past medical history of several OA of knees, type 2 DM, HTN, tobacco abuse presents today for a follow up.    HTN: currently managed on Valsartan -hydrochlorothiazide  80-12.5 mg once daily and lasix  20 mg once daily.  She has been checking her BP at home and reports readings of systolic 130-140s and diastolic in 90s. She has been taking her medication as prescribed until she ran out. She no-showed for her last appointment. She reports increased amount of stress due to her accommodations being denied at work. She has been smoking a lot lately. She denies any chest pain, blurred vision or headaches.   DM2: Currently Zepbound  2.5 mg once weekly. She established with RO online and discontinued the Mounjaro  5 mg on her own. She is also managed on Metformin  XR 500 mg once daily. She needs a refill for her Metformin . She has been trying to monitor her diet. She is not able to exercise.  She states that she will stop the RO program and would like a refill of her Mounjaro  5 mg once weekly. Due for eye exam. Urine ACR, Foot exam and pneumonia vaccine UTD. Hemoglobin A1c 7.2 in March, 2025.  Insomnia: Currently managed on Trazodone  50 mg once at bedtime. She is tolerating it well. She needs a refill.   Patient Active Problem List   Diagnosis Date Noted  . Dysuria 09/17/2023  . Shortness of breath 09/17/2023  . Chronic otitis externa of both ears 08/20/2023  . Encounter for screening involving social determinants of health (SDoH) 08/20/2023  . 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) 08/15/2023  . Vitamin D  deficiency 08/15/2023  . Insomnia 08/06/2023  . Obesity 08/06/2023  . Continuous  tobacco abuse 08/06/2023  . Osteoarthritis of knee 08/05/2023  . Essential hypertension 04/12/2017   Past Medical History:  Diagnosis Date  . Anxiety   . Arthritis   . Cholecystitis with cholelithiasis 02/25/2013  . Complication of anesthesia    woke up crying after tubes tied, anxiety attack  . Depression   . Depression 04/12/2017  . GERD (gastroesophageal reflux disease)   . Hypertension   . Migraine    no longer have these  . MRSA carrier    Past Surgical History:  Procedure Laterality Date  . CHOLECYSTECTOMY    . RADIOLOGY WITH ANESTHESIA Bilateral 08/08/2021   Procedure: MRI WITH BILATERAL HIPWITHOUT CONTRAST,RIGHT  KNEE WITHOUT CONTRAST;  Surgeon: Radiologist, Medication, MD;  Location: [facility] OR;  Service: Radiology;  Laterality: Bilateral;  . TONSILLECTOMY    . TUBAL LIGATION     No Known Allergies       12/20/2023    9:13 AM 09/17/2023   11:43 AM 08/20/2023   11:02 AM  Depression screen PHQ 2/9  Decreased Interest 1 2 1   Down, Depressed, Hopeless 1 2 1   PHQ - 2 Score 2 4 2   Altered sleeping 3 2 2   Tired, decreased energy 1 2 1   Change in appetite 1 2 0  Feeling bad or failure about yourself  1 0 1  Trouble concentrating 1 2 0  Moving slowly or fidgety/restless 0 0 0  Suicidal thoughts 0 0 0  PHQ-9 Score 9 12 6   Difficult doing work/chores Somewhat difficult Somewhat difficult Very difficult       12/20/2023    9:14 AM 09/17/2023   11:43 AM 08/20/2023   11:02 AM 08/06/2023    2:00 PM  GAD 7 : Generalized Anxiety Score  Nervous, Anxious, on Edge 2 2 1  0  Control/stop worrying 2 2 2  0  Worry too much - different things 2 2 2  0  Trouble relaxing 2 3 2  0  Restless 2 0 0 0  Easily annoyed or irritable 2 2 2  0  Afraid - awful might happen 2 0 2 0  Total GAD 7 Score 14 11 11  0  Anxiety Difficulty Somewhat difficult Somewhat difficult Very difficult Not difficult at all      Review of Systems  Constitutional:  Negative for chills and fever.  Respiratory:  Negative  for shortness of breath.   Cardiovascular:  Negative for chest pain.  Gastrointestinal:  Negative for abdominal pain, constipation, diarrhea, heartburn, nausea and vomiting.  Genitourinary:  Negative for dysuria, frequency and urgency.  Neurological:  Negative for dizziness and headaches.  Endo/Heme/Allergies:  Negative for polydipsia.  Psychiatric/Behavioral:  Negative for depression and suicidal ideas. The patient is not nervous/anxious.       Objective:     BP (!) 174/96   Pulse (!) 104   Temp 98.6 F (37 C) (Temporal)   Ht 5' 10 (1.778 m)   Wt (!) 343 lb 8 oz (155.8 kg)   LMP 01/30/2016 (Approximate)   SpO2 96%   BMI 49.29 kg/m  BP Readings from Last 3 Encounters:  12/20/23 (!) 174/96  10/08/23 (!) 186/116  09/17/23 (!) 138/94   Wt Readings from Last 3 Encounters:  12/20/23 (!) 343 lb 8 oz (155.8 kg)  10/08/23 300 lb (136.1 kg)  09/17/23 (!) 345 lb (156.5 kg)      Physical Exam Vitals and nursing note reviewed.  Constitutional:      Appearance: Normal appearance. She is obese.  Cardiovascular:     Rate and Rhythm: Normal rate and regular rhythm.     Pulses: Normal pulses.     Heart sounds: Normal heart sounds.  Pulmonary:     Effort: Pulmonary effort is normal.     Breath sounds: Normal breath sounds.  Neurological:     Mental Status: She is alert and oriented to person, place, and time.  Psychiatric:        Mood and Affect: Mood normal.        Behavior: Behavior normal.        Thought Content: Thought content normal.        Judgment: Judgment normal.      No results found for any visits on 12/20/23.     The ASCVD Risk score (Arnett DK, et al., 2019) failed to calculate for the following reasons:   The valid total cholesterol range is 130 to 320 mg/dL    Assessment & Plan:  Class 3 severe obesity due to excess calories with serious comorbidity and body mass index (BMI) of 45.0 to 49.9 in adult Assessment & Plan: Discussed the importance of  healthy diet and exercise to affect sustainable weight loss.   She was stared on Mounjaro  due to her type 2 DM. She did not realize that is the same medication as zepbound . She joined the [facility] and started zepbound  2.5 mg once weekly. She just did her first shot  this week and has three more left.  She would like to switch back to Mounjaro .   Refill sent.  F/u in 3 months.   Insomnia, unspecified type Assessment & Plan: Controlled.   Continue Trazodone  50 mg at bedtime. Refill sent.  Orders: -     traZODone  HCl; Take 1 tablet (50 mg total) by mouth at bedtime.  Dispense: 90 tablet; Refill: 0  Essential hypertension Assessment & Plan: Uncontrolled on all readings in the office today.  Reports home readings had improved tremendously prior to her running out of her medication.   Continue Valsartan  hydrochlorothiazide  80-25 mg once daily.  Continue lasix  20 mg once daily.  Send me BP readings in 1 week via mychart.   F/u in office in 3 months.  Orders: -     Valsartan -hydroCHLOROthiazide ; Take 1 tablet by mouth daily.  Dispense: 90 tablet; Refill: 1 -     Furosemide ; Take 1 tablet (20 mg total) by mouth daily.  Dispense: 90 tablet; Refill: 1  Type 2 diabetes mellitus with stage 1 chronic kidney disease, unspecified whether long term insulin use (HCC) Assessment & Plan: Last A1c 7.2 on 07/14/23.  Continue Metformin  500 XR once daily. Rx sent.  Resume Mounjaro  5 mg once weekly after she has finished the zepbound  2.5 mg once weekly. Refill sent for Mounjaro  5 mg once weekly.   Discussed the importance of medication adherence and discussing the discontinuation of medication with PCP.  Patient advised to schedule eye exam.  Foot exam UTD.  Urine ACR UTD.  F/u in 3 months.  Follow up in 3 months.  Orders: -     metFORMIN  HCl ER; Take 1 tablet (500 mg total) by mouth daily with breakfast.  Dispense: 90 tablet; Refill: 1 -     Mounjaro ; Inject 5 mg into the skin once a  week.  Dispense: 2 mL; Refill: 0  Shortness of breath Assessment & Plan: Improving.  Still smoking.  Did not go to see the pulmonologist.  Discussed the importance of medication adherence and the risks of smoking.    Need for shingles vaccine -     Varicella-zoster vaccine IM    Return in about 3 months (around 03/21/2024) for chronic care mangement.SABRA Carrol [place], NP

## 2023-12-20 NOTE — Assessment & Plan Note (Signed)
 Uncontrolled on all readings in the office today.  Reports home readings had improved tremendously prior to her running out of her medication.   Continue Valsartan  hydrochlorothiazide  80-25 mg once daily.  Continue lasix  20 mg once daily.  Send me BP readings in 1 week via mychart.   F/u in office in 3 months.

## 2023-12-20 NOTE — Assessment & Plan Note (Signed)
 Controlled.   Continue Trazodone  50 mg at bedtime. Refill sent.

## 2023-12-20 NOTE — Assessment & Plan Note (Signed)
 Discussed the importance of healthy diet and exercise to affect sustainable weight loss.   She was stared on Mounjaro  due to her type 2 DM. She did not realize that is the same medication as zepbound . She joined the [facility] and started zepbound  2.5 mg once weekly. She just did her first shot this week and has three more left.  She would like to switch back to Mounjaro .   Refill sent.  F/u in 3 months.

## 2023-12-20 NOTE — Patient Instructions (Signed)
 Continue Valsartan -hydrochlorothiazide  and lasix  for blood pressure.   Continue Metformin  once daily.  Finish zepbound  2.5 mg once weekly and then start Mounjaro  5 mg once weekly.   Follow up in 3 months.   It was a pleasure to see you today!

## 2023-12-20 NOTE — Assessment & Plan Note (Signed)
 Improving.  Still smoking.  Did not go to see the pulmonologist.  Discussed the importance of medication adherence and the risks of smoking.

## 2023-12-20 NOTE — Assessment & Plan Note (Signed)
 Last A1c 7.2 on 07/14/23.  Continue Metformin  500 XR once daily. Rx sent.  Resume Mounjaro  5 mg once weekly after she has finished the zepbound  2.5 mg once weekly. Refill sent for Mounjaro  5 mg once weekly.   Discussed the importance of medication adherence and discussing the discontinuation of medication with PCP.  Patient advised to schedule eye exam.  Foot exam UTD.  Urine ACR UTD.  F/u in 3 months.  Follow up in 3 months.

## 2023-12-23 ENCOUNTER — Other Ambulatory Visit: Payer: Self-pay | Admitting: *Deleted

## 2023-12-23 DIAGNOSIS — I10 Essential (primary) hypertension: Secondary | ICD-10-CM

## 2023-12-23 MED ORDER — VALSARTAN-HYDROCHLOROTHIAZIDE 80-12.5 MG PO TABS: 1.0000 | ORAL_TABLET | Freq: Every day | ORAL | 1 refills | Status: DC

## 2023-12-31 ENCOUNTER — Ambulatory Visit: Admitting: General Practice

## 2024-01-09 ENCOUNTER — Other Ambulatory Visit: Payer: Self-pay | Admitting: General Practice

## 2024-01-09 DIAGNOSIS — R0602 Shortness of breath: Secondary | ICD-10-CM

## 2024-01-10 ENCOUNTER — Telehealth: Payer: Self-pay | Admitting: General Practice

## 2024-01-10 ENCOUNTER — Other Ambulatory Visit: Payer: Self-pay | Admitting: General Practice

## 2024-01-10 DIAGNOSIS — N181 Chronic kidney disease, stage 1: Secondary | ICD-10-CM

## 2024-01-10 DIAGNOSIS — R0602 Shortness of breath: Secondary | ICD-10-CM

## 2024-01-10 MED ORDER — MOUNJARO 5 MG/0.5ML ~~LOC~~ SOAJ
5.0000 mg | SUBCUTANEOUS | 0 refills | Status: AC
Start: 2024-01-10 — End: ?

## 2024-01-10 NOTE — Telephone Encounter (Signed)
 Copied from CRM [id number]. Topic: Clinical - Medication Refill >> Jan 10, 2024 11:51 AM [place] J wrote: Medication: tirzepatide  (MOUNJARO ) 5 MG/0.5ML Pen  Has the patient contacted their pharmacy? Yes (Agent: If no, request that the patient contact the pharmacy for the refill. If patient does not wish to contact the pharmacy document the reason why and proceed with request.) (Agent: If yes, when and what did the pharmacy advise?) Patient's [place] pharmacy did not have supply so she is needing the request sent to a new pharmacy.  This is the patient's preferred pharmacy:   [facility] [phone] [place] - [address] [place] [address] Phone: [phone] Fax: [phone]  Is this the correct pharmacy for this prescription? Yes If no, delete pharmacy and type the correct one.   Has the prescription been filled recently? No  Is the patient out of the medication? Yes  Has the patient been seen for an appointment in the last year OR does the patient have an upcoming appointment? Yes  Can we respond through MyChart? Yes  Agent: Please be advised that Rx refills may take up to 3 business days. We ask that you follow-up with your pharmacy.

## 2024-03-12 ENCOUNTER — Encounter: Payer: Self-pay | Admitting: *Deleted

## 2024-03-20 ENCOUNTER — Encounter: Payer: Self-pay | Admitting: General Practice

## 2024-03-20 ENCOUNTER — Telehealth: Payer: Self-pay | Admitting: General Practice

## 2024-03-20 ENCOUNTER — Encounter: Admitting: Nurse Practitioner

## 2024-03-20 DIAGNOSIS — Z5971 Insufficient health insurance coverage: Secondary | ICD-10-CM

## 2024-03-20 NOTE — Telephone Encounter (Signed)
 Copied from CRM #[id number]. Topic: Appointments - Scheduling Inquiry for Clinic >> Mar 20, 2024  8:11 AM Emylou G wrote: Reason for CRM: Please let Dr Vincente - patient cancelled.. she doesn't have ins at this time.SABRA She wanted the dr to know

## 2024-03-23 ENCOUNTER — Ambulatory Visit: Payer: Self-pay | Admitting: General Practice

## 2024-03-25 ENCOUNTER — Other Ambulatory Visit: Payer: Self-pay | Admitting: General Practice

## 2024-03-25 ENCOUNTER — Telehealth: Payer: Self-pay

## 2024-03-25 DIAGNOSIS — R0602 Shortness of breath: Secondary | ICD-10-CM

## 2024-03-25 DIAGNOSIS — G4700 Insomnia, unspecified: Secondary | ICD-10-CM

## 2024-03-25 NOTE — Progress Notes (Signed)
 Complex Care Management Note  Care Guide Note 03/25/2024 Name: [name] MRN: [id number] DOB: 12-Dec-1970  [name] is a 53 y.o. year old female who sees Vincente Shivers, NP for primary care. I reached out to [name] by phone today to offer complex care management services.  [name] was given information about Complex Care Management services today including:   The Complex Care Management services include support from the care team which includes your Nurse Care Manager, Clinical Social Worker, or Pharmacist.  The Complex Care Management team is here to help remove barriers to the health concerns and goals most important to you. Complex Care Management services are voluntary, and the patient may decline or stop services at any time by request to their care team member.   Complex Care Management Consent Status: Patient agreed to services and verbal consent obtained.   Follow up plan:  Telephone appointment with complex care management team member scheduled for:  03/27/24 & 04/01/24.  Encounter Outcome:  Patient Scheduled  Dreama [facility]  [facility] VBCI Assistant Direct Dial: [phone]  Fax: [phone]

## 2024-03-27 ENCOUNTER — Other Ambulatory Visit: Payer: Self-pay

## 2024-03-27 NOTE — Patient Outreach (Addendum)
 Social Drivers of Health  Community Resource and Care Coordination Visit Note   03/27/2024  Name: [name] MRN: [id number] DOB:1970/09/28  Situation: Referral received for [facility] needs assessment and assistance related to [employer] and applying for Medicaid. I obtained verbal consent from Patient.  Visit completed with Patient on the phone.   Background:      Assessment:   Goals Addressed             This Visit's Progress    Patient Stated       Current SDOH Barriers:  Financial constraints related to no income/not working Housing barriers Lack of essential utilities - [employer]  Applying for [employer]  Interventions: SW emailed patient resources for rent, utilities, and applying for [employer].  Thersia Hoar, HEDWIG, MHA [place]  [facility] Social Worker, Population Health [phone]           Recommendation:   Apply for Medicaid online and use resources sw sent for rent and utilities.  Follow Up Plan:   Telephone follow-up 04/15/24 at 11am  Thersia Hoar, BSW, [facility]  [facility] Based [facility] Social Worker, Population Health [phone]

## 2024-03-27 NOTE — Patient Instructions (Signed)
 Visit Information  Thank you for taking time to visit with me today. Please don't hesitate to contact me if I can be of assistance to you before our next scheduled appointment.  Your next care management appointment is by telephone on 04/15/24 at 11am    Please call the care guide team at [phone] if you need to cancel, schedule, or reschedule an appointment.   Please call the Suicide and Crisis Lifeline: 988 call the [place]  National Suicide Prevention Lifeline: 409-177-0373 or TTY: 218 685 8944 TTY (787)238-7498) to talk to a trained counselor call 1-800-273-TALK (toll free, 24 hour hotline) call 911 if you are experiencing a Mental Health or Behavioral Health Crisis or need someone to talk to.  Thersia Hoar, HEDWIG, MHA [place]  [facility] Social Worker, Population Health [phone]

## 2024-04-01 ENCOUNTER — Other Ambulatory Visit: Payer: Self-pay

## 2024-04-01 DIAGNOSIS — E1169 Type 2 diabetes mellitus with other specified complication: Secondary | ICD-10-CM

## 2024-04-01 DIAGNOSIS — Z59868 Other specified financial insecurity: Secondary | ICD-10-CM

## 2024-04-01 DIAGNOSIS — I10 Essential (primary) hypertension: Secondary | ICD-10-CM

## 2024-04-01 DIAGNOSIS — F419 Anxiety disorder, unspecified: Secondary | ICD-10-CM

## 2024-04-01 NOTE — Patient Instructions (Signed)
 Visit Information  Thank you for taking time to visit with me today. Please don't hesitate to contact me if I can be of assistance to you before our next scheduled appointment.  Our next appointment is by telephone on 04/15/2024  at 3:30 pm Please call the care guide team at [phone] if you need to cancel or reschedule your appointment.   Following is a copy of your care plan:   Goals Addressed             This Visit's Progress    VBCI RN Care Plan - HTN/DM/Weight Loss       Problems:  Chronic Disease Management support and education needs related to DMII, HTN, and Weight Loss  Goal: Over the next 90  days the Patient will attend all scheduled medical appointments: Patient will attend appointments as evidenced by chart review        continue to work with RN Care Manager and/or Social Worker to address care management and care coordination needs related to DMII, HTN, and Weight Loss as evidenced by adherence to care management team scheduled appointments     take all medications exactly as prescribed and will call provider for medication related questions as evidenced by chart review    verbalize basic understanding of DMII, HTN, and Weight Loss disease process and self health management plan as evidenced by reading and asking questions about education in MyChart, once has insurance - appointment with PCP and share log of daily BP and Fasting Blood Sugar, take meds as prescribed, follow low salt/heart healthy DM diet, report concerns promptly to provider to prevent need for ED/hospitalization, exercise daily,   work with pharmacist to address Medication Cost related to Lack of Insurance at this time as evidenced by review of electronic medical record and patient or pharmacist report    work with child psychotherapist to address BSW regarding resources, insurance and disability and LCSW for depression and anxiety related to the management of financial strain and mental health as evidenced by review  of electronic medical record and patient or child psychotherapist report      Interventions:   Diabetes Interventions: Assessed patient's understanding of A1c goal: <6.5% Provided education to patient about basic DM disease process Reviewed medications with patient and discussed importance of medication adherence Counseled on importance of regular laboratory monitoring as prescribed Discussed plans with patient for ongoing care management follow up and provided patient with direct contact information for care management team Provided patient with written educational materials related to hypo and hyperglycemia and importance of correct treatment Advised patient, providing education and rationale, to check cbg FBG and prn and record, calling PCP for findings outside established parameters Referral made to pharmacy team for assistance with affording medications Referral made to social work team for assistance with resources and LCSW for Anxiety and depression Review of patient status, including review of consultants reports, relevant laboratory and other test results, and medications completed Screening for signs and symptoms of depression related to chronic disease state  Assessed social determinant of health barriers Lab Results  Component Value Date   HGBA1C 7.2 (H) 08/06/2023    Hypertension Interventions: Last practice recorded BP readings:  BP Readings from Last 3 Encounters:  12/20/23 (!) 174/96  10/08/23 (!) 186/116  09/17/23 (!) 138/94   Most recent eGFR/CrCl: No results found for: EGFR  No components found for: CRCL  Evaluation of current treatment plan related to hypertension self management and patient's adherence to plan as established by  provider Provided education to patient re: stroke prevention, s/s of heart attack and stroke Reviewed medications with patient and discussed importance of compliance Counseled on the importance of exercise goals with target of 150 minutes per  week Discussed plans with patient for ongoing care management follow up and provided patient with direct contact information for care management team Advised patient, providing education and rationale, to monitor blood pressure daily and record, calling PCP for findings outside established parameters Provided education on prescribed diet diabetic low salt/heart healthy Discussed complications of poorly controlled blood pressure such as heart disease, stroke, circulatory complications, vision complications, kidney impairment, sexual dysfunction Discussed cutting back on smoking (1.5 ppd)   Weight Loss Interventions: Advised patient to discuss with primary care provider options regarding weight management Collaboration with provider for dietician referral Provided verbal and/or written education to patient re: provider recommended life style modifications  Screening for signs and symptoms of depression Offered to connect patient with psychology or social work support for counseling and supportive care Reviewed recommended dietary changes: avoid fad diets, make small/incremental dietary and exercise changes, eat at the table and avoid eating in front of the TV, plan management of cravings, monitor snacking and cravings in food diary Discussed purchase of second hand instapot, crock pot or air fryer to enable her to cook safely and more healthy   Patient Self-Care Activities:  Attend all scheduled provider appointments Call pharmacy for medication refills 3-7 days in advance of running out of medications Call provider office for new concerns or questions  Take medications as prescribed   Work with the social worker to address care coordination needs and will continue to work with the clinical team to address health care and disease management related needs Work with the pharmacist to address medication management needs and will continue to work with the clinical team to address health care and  disease management related needs schedule appointment with eye doctor check blood sugar at prescribed times: once daily and if feeling high/low check feet daily for cuts, sores or redness enter blood sugar readings and medication or insulin into daily log take the blood sugar log to all doctor visits set goal weight trim toenails straight across drink 6 to 8 glasses of water each day fill half of plate with vegetables keep a food diary limit fast food meals to no more than 1 per week manage portion size prepare main meal at home 3 to 5 days each week set a realistic goal switch to low-fat or skim milk switch to sugar-free drinks do heel pump exercise 2 to 3 times each day keep feet up while sitting wash and dry feet carefully every day wear comfortable, cotton socks wear comfortable, well-fitting shoes check blood pressure daily learn about high blood pressure keep a blood pressure log take blood pressure log to all doctor appointments call doctor for signs and symptoms of high blood pressure keep all doctor appointments take medications for blood pressure exactly as prescribed report new symptoms to your doctor eat more whole grains, fruits and vegetables, lean meats and healthy fats limit salt intake   Plan:  Telephone follow up appointment with care management team member scheduled for:  04/15/2024 at 3:30 pm             Please call the Suicide and Crisis Lifeline: 988 call the [place]  National Suicide Prevention Lifeline: 323-339-4020 or TTY: 830 025 7703 TTY 434-397-5671) to talk to a trained counselor call 1-800-273-TALK (toll free, 24 hour hotline) go to [employer]  [facility] [address] [place] [phone]) call 911 if you are experiencing a Mental Health or Behavioral Health Crisis or need someone to talk to.  Patient verbalized understanding of Care plan and visit instructions communicated this visit  Nestora Duos,  MSN, RN [place]  [facility], [facility] RN Care Manager Direct Dial: [phone] Fax: [phone]  Managing Your Hypertension Hypertension, also called high blood pressure, is when the force of the blood pressing against the walls of the arteries is too strong. Arteries are blood vessels that carry blood from your heart throughout your body. Hypertension forces the heart to work harder to pump blood and may cause the arteries to become narrow or stiff. Understanding blood pressure readings A blood pressure reading includes a higher number over a lower number: The first, or top, number is called the systolic pressure. It is a measure of the pressure in your arteries as your heart beats. The second, or bottom number, is called the diastolic pressure. It is a measure of the pressure in your arteries as the heart relaxes. For most people, a normal blood pressure is below 120/80. Your personal target blood pressure may vary depending on your medical conditions, your age, and other factors. Blood pressure is classified into four stages. Based on your blood pressure reading, your health care provider may use the following stages to determine what type of treatment you need, if any. Systolic pressure and diastolic pressure are measured in a unit called millimeters of mercury (mmHg). Normal Systolic pressure: below 120. Diastolic pressure: below 80. Elevated Systolic pressure: 120-129. Diastolic pressure: below 80. Hypertension stage 1 Systolic pressure: 130-139. Diastolic pressure: 80-89. Hypertension stage 2 Systolic pressure: 140 or above. Diastolic pressure: 90 or above. How can this condition affect me? Managing your hypertension is very important. Over time, hypertension can damage the arteries and decrease blood flow to parts of the body, including the brain, heart, and kidneys. Having untreated or uncontrolled hypertension can lead to: A heart attack. A  stroke. A weakened blood vessel (aneurysm). Heart failure. Kidney damage. Eye damage. Memory and concentration problems. Vascular dementia. What actions can I take to manage this condition? Hypertension can be managed by making lifestyle changes and possibly by taking medicines. Your health care provider will help you make a plan to bring your blood pressure within a normal range. You may be referred for counseling on a healthy diet and physical activity. Nutrition  Eat a diet that is high in fiber and potassium, and low in salt (sodium), added sugar, and fat. An example eating plan is called the DASH diet. DASH stands for Dietary Approaches to Stop Hypertension. To eat this way: Eat plenty of fresh fruits and vegetables. Try to fill one-half of your plate at each meal with fruits and vegetables. Eat whole grains, such as whole-wheat pasta, brown rice, or whole-grain bread. Fill about one-fourth of your plate with whole grains. Eat low-fat dairy products. Avoid fatty cuts of meat, processed or cured meats, and poultry with skin. Fill about one-fourth of your plate with lean proteins such as fish, chicken without skin, beans, eggs, and tofu. Avoid pre-made and processed foods. These tend to be higher in sodium, added sugar, and fat. Reduce your daily sodium intake. Many people with hypertension should eat less than 1,500 mg of sodium a day. Lifestyle  Work with your health care provider to maintain a healthy body weight or to lose weight. Ask what an ideal weight is for  you. Get at least 30 minutes of exercise that causes your heart to beat faster (aerobic exercise) most days of the week. Activities may include walking, swimming, or biking. Include exercise to strengthen your muscles (resistance exercise), such as weight lifting, as part of your weekly exercise routine. Try to do these types of exercises for 30 minutes at least 3 days a week. Do not use any products that contain nicotine or  tobacco. These products include cigarettes, chewing tobacco, and vaping devices, such as e-cigarettes. If you need help quitting, ask your health care provider. Control any long-term (chronic) conditions you have, such as high cholesterol or diabetes. Identify your sources of stress and find ways to manage stress. This may include meditation, deep breathing, or making time for fun activities. Alcohol use Do not drink alcohol if: Your health care provider tells you not to drink. You are pregnant, may be pregnant, or are planning to become pregnant. If you drink alcohol: Limit how much you have to: 0-1 drink a day for women. 0-2 drinks a day for men. Know how much alcohol is in your drink. In the [place] one drink equals one 12 oz bottle of beer (355 mL), one 5 oz glass of wine (148 mL), or one 1 oz glass of hard liquor (44 mL). Medicines Your health care provider may prescribe medicine if lifestyle changes are not enough to get your blood pressure under control and if: Your systolic blood pressure is 130 or higher. Your diastolic blood pressure is 80 or higher. Take medicines only as told by your health care provider. Follow the directions carefully. Blood pressure medicines must be taken as told by your health care provider. The medicine does not work as well when you skip doses. Skipping doses also puts you at risk for problems. Monitoring Before you monitor your blood pressure: Do not smoke, drink caffeinated beverages, or exercise within 30 minutes before taking a measurement. Use the bathroom and empty your bladder (urinate). Sit quietly for at least 5 minutes before taking measurements. Monitor your blood pressure at home as told by your health care provider. To do this: Sit with your back straight and supported. Place your feet flat on the floor. Do not cross your legs. Support your arm on a flat surface, such as a table. Make sure your upper arm is at heart level. Each time you  measure, take two or three readings one minute apart and record the results. You may also need to have your blood pressure checked regularly by your health care provider. General information Talk with your health care provider about your diet, exercise habits, and other lifestyle factors that may be contributing to hypertension. Review all the medicines you take with your health care provider because there may be side effects or interactions. Keep all follow-up visits. Your health care provider can help you create and adjust your plan for managing your high blood pressure. Where to find more information National Heart, Lung, and Blood Institute: popsteam.is American Heart Association: www.heart.org Contact a health care provider if: You think you are having a reaction to medicines you have taken. You have repeated (recurrent) headaches. You feel dizzy. You have swelling in your ankles. You have trouble with your vision. Get help right away if: You develop a severe headache or confusion. You have unusual weakness or numbness, or you feel faint. You have severe pain in your chest or abdomen. You vomit repeatedly. You have trouble breathing. These symptoms may be an emergency. Get  help right away. Call 911. Do not wait to see if the symptoms will go away. Do not drive yourself to the hospital. Summary Hypertension is when the force of blood pumping through your arteries is too strong. If this condition is not controlled, it may put you at risk for serious complications. Your personal target blood pressure may vary depending on your medical conditions, your age, and other factors. For most people, a normal blood pressure is less than 120/80. Hypertension is managed by lifestyle changes, medicines, or both. Lifestyle changes to help manage hypertension include losing weight, eating a healthy, low-sodium diet, exercising more, stopping smoking, and limiting alcohol. This information is  not intended to replace advice given to you by your health care provider. Make sure you discuss any questions you have with your health care provider. Document Revised: 01/12/2021 Document Reviewed: 01/12/2021 Elsevier Patient Education  2024 Else[employer].  Warning Signs of a Stroke: What to Know A stroke happens when there's less blood flow to part of your brain. This keeps your brain from getting enough oxygen. It can lead to a brain injury. A stroke is an emergency and should be treated right away. You're more likely to get better after a stroke if you get help right away. It's very important to know the symptoms of a stroke. What types of strokes are there? There are 2 main types of stroke: Ischemic stroke. This is the most common type. It happens when a blood vessel that sends blood to the brain is blocked. Hemorrhagic stroke. This happens when there's bleeding in your brain. This may be from a blood vessel leaking or bursting. A transient ischemic attack (TIA) causes the same symptoms as a stroke. But the symptoms go away quickly and don't cause lasting damage to your brain. TIAs need to be treated right away. Having a TIA is a sign that you are more at risk for having a stroke in the future. What are the warning signs of a stroke? The symptoms of a stroke may differ based on where it is in your brain. Symptoms often happen all of a sudden. BE FAST symptoms BE FAST is an easy way to remember the main warning signs: B - Balance. Feeling dizzy, sudden trouble walking, or loss of balance. E - Eyes. Trouble seeing or a change in how you see. F - Face. Sudden weakness or feeling numb in the face. The face or eyelid may droop on one side. A - Arms. Weakness or loss of feeling in an arm. This happens fast and often only on one side. ILENE - Speech. Sudden trouble speaking, slurred speech, or trouble understanding what people say. T - Time. Time to call 911. Write down what time symptoms  started. You may be having a stroke even if you only have one BE FAST symptom. Other signs of a stroke Other signs of a stroke may be: A sudden, very bad headache with no known cause. Feeling like you may throw up. Throwing up. These symptoms may be an emergency. Call 911 right away. Do not wait to see if the symptoms will go away. Do not drive yourself to the hospital.  This information is not intended to replace advice given to you by your health care provider. Make sure you discuss any questions you have with your health care provider. Document Revised: 08/22/2023 Document Reviewed: 08/22/2023 Elsevier Patient Education  2025 [employer].  Hyperglycemia Hyperglycemia is when the amount of sugar, or glucose, in your blood  is too high. High blood sugar can happen if you have diabetes or if you don't have diabetes. It may be an emergency. What are the causes? If you have diabetes, high blood sugar may be caused by: Medicines that increase blood sugar. Not giving yourself enough insulin (if you take it). Being less active than normal. Eating more than planned. Illness, an injury, or an infection. Having surgery. Stress. If you don't have diabetes, high blood sugar may be caused by: Certain medicines, such as steroids or thiazide diuretics. Stress. A bad illness or infection. Having surgery. Diseases of the pancreas. What increases the risk? You're more likely to have high blood sugar if: Someone in your family has diabetes. You're overweight. You aren't active. You have or have had: Prediabetes. Diabetes when pregnant. Polycystic ovarian syndrome (PCOS). What are the signs or symptoms? High blood sugar may not cause symptoms. If you do have symptoms, they may include: Feeling more thirsty than normal. Needing to pee more often than normal. Hunger. Feeling very tired. Blurry eyesight. You may have other symptoms if your high blood sugar isn't treated. These may  include: Pain in your belly. A headache. Weakness. Weight loss that's not planned. A tingling or numb feeling in your hands or feet. Cuts or bruises that heal slowly. How is this diagnosed?  High blood sugar is diagnosed with a blood test. This test tells you how much sugar is in your blood. It's done while you're having symptoms.  Your health care provider may also do a physical exam, look at your medical history, and do more blood tests. These tests may include: A fasting blood glucose (FBG) test. You can't eat for at least 8 hours before this test. An A1C test. A glucose tolerance test. How is this treated? Treatment may include: Taking medicine to control your blood sugar levels. Changing your medicine or how much you take if you take insulin or other diabetes medicines. Checking your blood sugar more often. Making changes to your daily life. These may include: Being more active. Eating healthier foods. Losing weight. Treating an illness or infection. Stopping or taking less steroids. If your high blood sugar stays high, you may need to be treated in the hospital. Follow these instructions at home: If you have diabetes:  Know the symptoms of high blood sugar. Follow your diabetes care plan. Make sure you: Take insulin and medicines as told. Check your blood sugar as often as told. Eat on time. Do not skip meals. Check your blood sugar before and after you exercise. If you exercise longer or harder than normal, check your blood sugar more often. Follow your sick day plan when you can't eat or drink like normal. Make this plan ahead of time with your provider. Share your diabetes care plan with: Your work or school. The people you live with. Wear an alert bracelet or carry a card that says you have diabetes. General instructions Take medicines only as told by your provider. Drink enough fluid to keep your pee (urine) pale yellow. Make sure you drink enough when  you: Exercise. Get sick. Are in hot places. If you drink alcohol: Limit how much you have to: 0-1 drink a day if you're female. 0-2 drinks a day if you're female. Know how much alcohol is in your drink. In the [place] one drink is one 12 oz bottle of beer (355 mL), one 5 oz glass of wine (148 mL), or one 1 oz glass of hard liquor (44 mL).  Manage stress. If you need help with this, ask your provider. Exercise as told. Try to stay at a healthy weight. Keep all follow-up visits. Your provider will want to make sure your high blood sugar is treated. Where to find more information American Diabetes Association (ADA): diabetes.org Contact a health care provider if: You have diabetes and have trouble keeping your blood sugar in the right range. Your blood sugar is at or above 240 mg/dL (86.6 mmol/L) for 2 days in a row. You have high blood sugar often. You have signs of illness, such as: Nausea or vomiting. A headache. A fever. You can't stop vomiting. Get help right away if: Your blood sugar monitor reads high even when you're taking insulin. You have trouble breathing. These symptoms may be an emergency. Call 911 right away. Do not wait to see if the symptoms will go away. Do not drive yourself to the hospital. This information is not intended to replace advice given to you by your health care provider. Make sure you discuss any questions you have with your health care provider. Document Revised: 01/31/2023 Document Reviewed: 07/18/2022 Elsevier Patient Education  2024 Elsevier Inc.  Diabetes: Healthy Eating for Adults When you have diabetes, also called diabetes mellitus, it's important to have healthy eating habits. Your blood sugar (glucose) levels are greatly affected by what you eat and drink. You need to eat healthy foods in the right amounts, at about the same times each day. Doing this can help you: Manage your blood sugar. Lower your risk of heart disease. Improve your blood  pressure. Reach or stay at a healthy weight. What can affect my meal plan? Every person with diabetes is different. And each person has different needs for a meal plan. Your health care provider may suggest that you work with an expert in healthy eating called a dietitian. They can help you make a meal plan that's best for you. How do carbohydrates affect me? Carbohydrates, also called carbs, affect your blood sugar level more than any other type of food. Eating carbs raises the amount of sugar in your blood. It's important to know how many carbs you can safely have in each meal. This is different for every person. Your dietitian can help you calculate how many carbs you should have at each meal and for each snack. How does alcohol affect me? Alcohol can cause a decrease in blood sugar (hypoglycemia), especially if you use insulin or take certain diabetes medicines by mouth. Hypoglycemia can be a life-threatening condition. Symptoms of hypoglycemia are similar to those of having too much alcohol. They include confusion, being sleepy, and feeling dizzy. Do not drink alcohol if: Your provider tells you not to drink. You're pregnant, may be pregnant, or plan to become pregnant. What are tips for following this plan? Reading food labels Start by checking the serving size on the Nutrition Facts label of packaged foods and drinks. The number of calories and the amount of carbs, fats, and other nutrients listed on the label are based on one serving of the item. Many items contain more than one serving per package. Check the total grams (g) of carbs in one serving. Check the number of grams of saturated fats and trans fats in one serving. Choose foods that have a low amount or none of these fats. Check the number of milligrams (mg) of salt (sodium) in one serving. Most people should limit their total sodium intake to less than 2,300 mg per day. Always check the  nutrition information of foods labeled as  low-fat or nonfat. These foods may be higher in added sugar or refined carbs and should be avoided. Talk to your dietitian to identify your daily goals for nutrients listed on the label. Shopping Avoid buying canned, pre-made, or processed foods. These foods tend to be high in fat, sodium, and added sugar. Shop around the outside edge of the grocery store. This is where you'll most often find fresh fruits and vegetables, bulk grains, fresh meats, and fresh dairy products. Cooking Use low-heat cooking methods, such as baking, instead of high-heat methods like deep frying. Cook using healthy oils, such as olive, canola, or sunflower oil. Avoid cooking with butter, cream, or high-fat meats. Meal planning  Eat meals and snacks regularly. Try to eat them at the same times every day. Avoid going too long without eating. Eat foods that are high in fiber, such as fresh fruits, vegetables, beans, and whole grains. Eat 4-6 oz (112-168 g) of lean protein each day, such as lean meat, chicken, fish, eggs, or tofu. One ounce (oz) (28 g) of lean protein is equal to: 1 oz (28 g) of meat, chicken, or fish. 1 egg.  cup (62 g) of tofu. Eat some foods each day that contain healthy fats, such as avocado, nuts, seeds, and fish. What foods should I eat? Fruits Berries. Apples. Oranges. Peaches. Apricots. Plums. Grapes. Mangoes. Papayas. Pomegranates. Kiwi. Cherries. Vegetables Leafy greens, including lettuce, spinach, kale, chard, collard greens, mustard greens, and cabbage. Beets. Cauliflower. Broccoli. Carrots. Green beans. Tomatoes. Peppers. Onions. Cucumbers. Brussels sprouts. Grains Whole grains, such as whole-wheat or whole-grain bread, crackers, tortillas, cereal, and pasta. Unsweetened oatmeal. Quinoa. Brown or wild rice. Meats and other proteins Seafood. Poultry without skin. Lean cuts of poultry and beef. Tofu. Nuts. Seeds. Dairy Low-fat or fat-free dairy products such as milk, yogurt, and  cheese. The items listed above may not be all the foods and drinks you can have. Talk with a dietitian to learn more. What foods should I avoid? Fruits Fruits canned with syrup. Vegetables Canned vegetables. Frozen vegetables with butter or cream sauce. Grains Refined white flour and flour products such as bread, pasta, snack foods, and cereals. Avoid all processed foods. Meats and other proteins Fatty cuts of meat. Poultry with skin. Breaded or fried meats. Processed meat. Avoid saturated fats. Dairy Full-fat yogurt, cheese, or milk. Beverages Sweetened drinks, such as soda or iced tea. The items listed above may not be all the foods and drinks you should avoid. Talk with a dietitian to learn more. Where to find more information: To learn more, go to: Academy of Nutrition and Dietetics at deathprevention.it. Click Search and type diabetes. Find the link you need. Centers for Disease Control and Prevention at tonerpromos.no. Click Search and type diabetes. Find the link you need. American Diabetes Association: diabetes.org/food-nutrition [employer] of Diabetes and Digestive and Kidney Diseases: stagesync.si This information is not intended to replace advice given to you by your health care provider. Make sure you discuss any questions you have with your health care provider. Document Revised: 04/18/2023 Document Reviewed: 04/18/2023 Elsevier Patient Education  2025 [employer].  Fall Prevention in the Home, Adult Falls can cause injuries and can happen to people of all ages. There are many things you can do to make your home safer and to help prevent falls. What actions can I take to prevent falls? General information Use good lighting in all rooms. Make sure to: Replace any light bulbs that burn  out. Turn on the lights in dark areas and use night-lights. Keep items that you use often in easy-to-reach places. Lower the shelves around your home if needed. Move furniture so  that there are clear paths around it. Do not use throw rugs or other things on the floor that can make you trip. If any of your floors are uneven, fix them. Add color or contrast paint or tape to clearly mark and help you see: Grab bars or handrails. First and last steps of staircases. Where the edge of each step is. If you use a ladder or stepladder: Make sure that it is fully opened. Do not climb a closed ladder. Make sure the sides of the ladder are locked in place. Have someone hold the ladder while you use it. Know where your pets are as you move through your home. What can I do in the bathroom?     Keep the floor dry. Clean up any water on the floor right away. Remove soap buildup in the bathtub or shower. Buildup makes bathtubs and showers slippery. Use non-skid mats or decals on the floor of the bathtub or shower. Attach bath mats securely with double-sided, non-slip rug tape. If you need to sit down in the shower, use a non-slip stool. Install grab bars by the toilet and in the bathtub and shower. Do not use towel bars as grab bars. What can I do in the bedroom? Make sure that you have a light by your bed that is easy to reach. Do not use any sheets or blankets on your bed that hang to the floor. Have a firm chair or bench with side arms that you can use for support when you get dressed. What can I do in the kitchen? Clean up any spills right away. If you need to reach something above you, use a step stool with a grab bar. Keep electrical cords out of the way. Do not use floor polish or wax that makes floors slippery. What can I do with my stairs? Do not leave anything on the stairs. Make sure that you have a light switch at the top and the bottom of the stairs. Make sure that there are handrails on both sides of the stairs. Fix handrails that are broken or loose. Install non-slip stair treads on all your stairs if they do not have carpet. Avoid having throw rugs at the  top or bottom of the stairs. Choose a carpet that does not hide the edge of the steps on the stairs. Make sure that the carpet is firmly attached to the stairs. Fix carpet that is loose or worn. What can I do on the outside of my home? Use bright outdoor lighting. Fix the edges of walkways and driveways and fix any cracks. Clear paths of anything that can make you trip, such as tools or rocks. Add color or contrast paint or tape to clearly mark and help you see anything that might make you trip as you walk through a door, such as a raised step or threshold. Trim any bushes or trees on paths to your home. Check to see if handrails are loose or broken and that both sides of all steps have handrails. Install guardrails along the edges of any raised decks and porches. Have leaves, snow, or ice cleared regularly. Use sand, salt, or ice melter on paths if you live where there is ice and snow during the winter. Clean up any spills in your garage right away. This  includes grease or oil spills. What other actions can I take? Review your medicines with your doctor. Some medicines can cause dizziness or changes in blood pressure, which increase your risk of falling. Wear shoes that: Have a low heel. Do not wear high heels. Have rubber bottoms and are closed at the toe. Feel good on your feet and fit well. Use tools that help you move around if needed. These include: Canes. Walkers. Scooters. Crutches. Ask your doctor what else you can do to help prevent falls. This may include seeing a physical therapist to learn to do exercises to move better and get stronger. Where to find more information Centers for Disease Control and Prevention, STEADI: tonerpromos.no [employer] on Aging: baseringtones.pl National Institute on Aging: baseringtones.pl Contact a doctor if: You are afraid of falling at home. You feel weak, drowsy, or dizzy at home. You fall at home. Get help right away if you: Lose consciousness or  have trouble moving after a fall. Have a fall that causes a head injury. These symptoms may be an emergency. Get help right away. Call 911. Do not wait to see if the symptoms will go away. Do not drive yourself to the hospital. This information is not intended to replace advice given to you by your health care provider. Make sure you discuss any questions you have with your health care provider. Document Revised: 01/01/2022 Document Reviewed: 01/01/2022 Elsevier Patient Education  2024 [employer].   Advance Directive  Advance directives are legal papers that state your wishes about health care decisions. They let your wishes be known to family, friends, and health care providers if you become unable to speak for yourself.  You should write these papers out over time rather than all at once. They can be changed and updated at any time. The types of advance directives include: Medical power of attorney (POA). Living wills. Do not resuscitate (DNR) or do not attempt resuscitation (DNAR) orders. What are a health care proxy and medical POA? A health care proxy is also called a health care agent. It's a person you choose to make medical decisions for you when you can't make them for yourself. In most cases, a proxy is a trusted friend or family member. A medical POA is legal paperwork that names your proxy. It may need to be: Signed. Notarized. Dated. Copied. Witnessed. Added to your medical record. You may also want to choose someone to handle your money if you can't do so. This is called a durable POA for finances. It's separate from a medical POA. You may choose your health care proxy or someone else to act as your agent in money matters. If you don't have a proxy, or if the proxy may not be acting in your best interest, a court may choose a guardian to act on your behalf. What is a living will? A living will is legal paperwork that states your wishes about medical care. Providers  should keep a copy of it in your medical record. You may want to give a copy to family members or friends. You can also keep a card in your wallet to let loved ones know you have a living will and where they can find it. A living will may be used if: You're very sick with something that will end your life. You become disabled. You can't make decisions or speak for yourself. Your living will should include whether: To use or not use life support equipment. This may include machines  to filter your blood or to help you breathe. You want a DNR or DNAR order. This tells providers not to use CPR if your heart or breathing stops. To use or not use tube feeding. You want to be given foods and fluids. You want a type of comfort care called palliative care. This may be given when the goal for treatment becomes comfort rather than a cure. You want to donate your organs and tissues. A living will doesn't say what to do with your money and property if you pass away. What is a DNR or DNAR? A DNR or DNAR order is a request not to have CPR. If you don't have one of these orders, a provider will try to help you if your heart stops or you stop breathing.  If you plan to have surgery, talk with your provider about your DNR or DNAR order. What happens if I don't have an advance directive? Each state has its own laws about advance directives. Some states assign family decision makers to act on your behalf if you don't have an advance directive.  Check with your provider, attorney, or state representative about the laws in your state. Where to find more information Each state has its own laws about advance directives. You can look up these laws at: https://rodriguez-phillips.com/ This information is not intended to replace advice given to you by your health care provider. Make sure you discuss any questions you have with your health care provider. Document Revised: 09/17/2022 Document Reviewed: 09/17/2022 Elsevier Patient Education   2024 [employer].

## 2024-04-01 NOTE — Patient Outreach (Signed)
 Complex Care Management   Visit Note  04/01/2024  Name:  [name] MRN: [id number] DOB: Feb 27, 1971  Situation: Referral received for Complex Care Management related to Diabetes with Complications and HTN and Weight loss I obtained verbal consent from Patient.  Visit completed with Patient  on the phone  Background:   Past Medical History:  Diagnosis Date   Anxiety    Arthritis    Cholecystitis with cholelithiasis 02/25/2013   Complication of anesthesia    woke up crying after tubes tied, anxiety attack   Depression    Depression 04/12/2017   GERD (gastroesophageal reflux disease)    Hypertension    Migraine    no longer have these   MRSA carrier     Assessment: Patient Reported Symptoms:  Cognitive Cognitive Status: Alert and oriented to person, place, and time, Insightful and able to interpret abstract concepts, Normal speech and language skills, Difficulties with attention and concentration (Dx with ADHD in past - no longer on meds due to BP) Cognitive/Intellectual Conditions Management [RPT]: None reported or documented in medical history or problem list   Health Maintenance Behaviors: Exercise Healing Pattern: Unsure  Neurological Neurological Review of Symptoms: Weakness, Headaches, Numbness Neurological Comment: migraines - no med due to cost, rest in dark helps, unsure of triggers, weakness right leg from lack of use, left feels weak at times, uses it to roll chair, hands numb, middle ring or pinky fingers numb for >1 year, unsure cause, feet numb  HEENT HEENT Symptoms Reported: Other: (poor vision will get exam once has MCD)      Cardiovascular Cardiovascular Symptoms Reported: Palpitations, Swelling in legs or feet Does patient have uncontrolled Hypertension?: No Cardiovascular Management Strategies: Routine screening Cardiovascular Comment: palpitations from time to time with SOB few seconds, sister died of MI last month, red flags for ED, swelling in feet  since no BP meds, diuretics since end of Septmber - RNCM referring to [facility] for med assist, last weight 328 lb, no recent BP, discussed and agreed to check and record BP daily red flags for ED provided, info also sent via MyChart  Respiratory Other Respiratory Symptoms: resp illness for about a week, wheeze waking from sleep, SOB, coughing, occasionally productive cough for gray/green phlegm, no sx fever, using inhaler with improvement, sx to notify provider and red flags for ED, discussed use of PCP, UC before needing ED Additional Respiratory Details: smokes 1.5 ppd - reduced while sick because hurts to smoke, given 1-800-QUIT-NOW Respiratory Management Strategies: Medication therapy  Endocrine Endocrine Symptoms Reported: Blurry vision, Increased thirst, Increased urination Is patient diabetic?: Yes Is patient checking blood sugars at home?: No Endocrine Comment: reports out of meds since end of September and not checking BG, thinks she stil lhas supplies - will find and get some to check FBG daily,  discussed signs of hyperglycemia and reported increased thirst and urination last few days, found supplies and checked BG, currently 98, agreed to check FBG and record daily and check as needed, sending education via MyChart  Gastrointestinal Gastrointestinal Symptoms Reported: No symptoms reported Additional Gastrointestinal Details: hx reflux no issues Gastrointestinal Comment: difficulty cooking due to inability to stand and reports son does not cook halthy, discussed possibility of getting air fryer, crock pot or instapot second-hand and safety while cooking, sending diet info re MyChart    Genitourinary Genitourinary Symptoms Reported: Frequency Additional Genitourinary Details: thinks frequency associated to BG, no UTI sx    Integumentary Integumentary Symptoms Reported: No symptoms reported  Musculoskeletal Musculoskelatal Symptoms Reviewed: Joint pain, Muscle pain, Weakness, Back pain,  Other Other Musculoskeletal Symptoms: see below Additional Musculoskeletal Details: Right knee pain severe - unable to bear weight, needs surgery but needs to have BMI </40, uses wheelchair and rolling chair, falls from chair a few months ago, does in chair exercises, muslce cramps, back pain arthritis, bone spurs Musculoskeletal Management Strategies: Exercise, Medical device, Weight management Falls in the past year?: Yes Number of falls in past year: 2 or more Was there an injury with Fall?: Yes Fall Risk Category Calculator: 3 Patient Fall Risk Level: High Fall Risk Patient at Risk for Falls Due to: History of fall(s), Impaired balance/gait, Impaired mobility, Orthopedic patient Fall risk Follow up: Falls evaluation completed, Education provided, Falls prevention discussed  Psychosocial Psychosocial Symptoms Reported: Anger, Difficulty concentrating, Irritability, Anxiety - if selected complete GAD, Depression - if selected complete PHQ 2-9 Additional Psychological Details: good days and bad days sister suddenly died of MI last month, not working - interested in [employer] - referred, meds in past for depression - not very helpful - too long to work and had sporadic benefits so if not covered could not continue med Engineer, Drilling Strategies: Exercise, Support system Behavioral Health Comment: close with and talks with Mom and Ex (her best friend) Major Change/Loss/Stressor/Fears (CP): Death of a loved one, Medical condition, self, Resources, School or job Techniques to [employer] with Loss/Stress/Change: Exercise Quality of Family Relationships: involved, supportive    04/01/2024    PHQ2-9 Depression Screening   Little interest or pleasure in doing things Nearly every day  Feeling down, depressed, or hopeless Nearly every day  PHQ-2 - Total Score 6  Trouble falling or staying asleep, or sleeping too much Nearly every day  Feeling tired or having little energy Nearly every day  Poor appetite  or overeating  Nearly every day  Feeling bad about yourself - or that you are a failure or have let yourself or your family down Nearly every day  Trouble concentrating on things, such as reading the newspaper or watching television Nearly every day  Moving or speaking so slowly that other people could have noticed.  Or the opposite - being so fidgety or restless that you have been moving around a lot more than usual Not at all  Thoughts that you would be better off dead, or hurting yourself in some way Not at all  PHQ2-9 Total Score 21  If you checked off any problems, how difficult have these problems made it for you to do your work, take care of things at home, or get along with other people Very difficult  Depression Interventions/Treatment Counseling (referred t LCSW)    There were no vitals filed for this visit.    Medications Reviewed Today     Reviewed by Devra Lands, RN (Registered Nurse) on 04/01/24 at 1127  Med List Status: <None>   Medication Order Taking? Sig Documenting Provider Last Dose Status Informant  Accu-Chek Softclix Lancets lancets [id number]  1 each 3 (three) times daily.  Patient not taking: Reported on 04/01/2024   [provider]  Active   albuterol  (VENTOLIN  HFA) 108 (90 Base) MCG/ACT inhaler [id number] Yes INHALE TWO PUFFS INTO THE LUNGS EVERY SIX  HOURS AS NEEDED FOR WHEEZING OR SHORTNESS OF BREATH. Vincente Shivers, NP  Active   Blood Glucose Monitoring Suppl DEVI [id number]  1 each by Does not apply route in the morning, at noon, and at bedtime. May substitute to any manufacturer covered by  patient's insurance.  Patient not taking: Reported on 04/01/2024   Vincente Shivers, NP  Active   furosemide  (LASIX ) 20 MG tablet [id number]  Take 1 tablet (20 mg total) by mouth daily.  Patient not taking: Reported on 04/01/2024   Vincente Shivers, NP  Active   metFORMIN  (GLUCOPHAGE -XR) 500 MG 24 hr tablet [id number]  Take 1 tablet (500 mg total) by mouth daily with  breakfast.  Patient not taking: Reported on 04/01/2024   Vincente Shivers, NP  Active   tirzepatide  (MOUNJARO ) 5 MG/0.5ML Pen [id number] Yes Inject 5 mg into the skin once a week. Vincente Shivers, NP  Active   traZODone  (DESYREL ) 50 MG tablet [id number]  TAKE ONE TABLET (50 MG TOTAL) BY MOUTH AT BEDTIME.  Patient not taking: Reported on 04/01/2024   Vincente Shivers, NP  Active   valsartan -hydrochlorothiazide  (DIOVAN -HCT) 80-12.5 MG tablet [id number]  Take 1 tablet by mouth daily.  Patient not taking: Reported on 04/01/2024   Dugal, Tabitha, FNP  Active   Vitamin D , Ergocalciferol , (DRISDOL ) 1.25 MG (50000 UNIT) CAPS capsule [id number]  Take 1 capsule (50,000 Units total) by mouth every 7 (seven) days.  Patient not taking: Reported on 04/01/2024   Vincente Shivers, NP  Active   ZEPBOUND  2.5 MG/0.5ML Pen [id number]    Patient not taking: Reported on 04/01/2024   [provider]  Active             Recommendation:   PCP Follow-up Continue Current Plan of Care LCSW 04/27/2024 at 1:00 Apply for Medicaid Explore disability You will get a call to schedule with Pharmacist o discuss medication costs Diabetes and HTN   Follow Up Plan:   Telephone follow-up 2 [address] Yeatts  Nestora Duos, MSN, RN [facility]  [facility], [facility] RN Care Manager Direct Dial: [phone] Fax: [phone]

## 2024-04-07 ENCOUNTER — Telehealth: Payer: Self-pay

## 2024-04-07 ENCOUNTER — Telehealth: Payer: Self-pay | Admitting: Physician Assistant

## 2024-04-07 DIAGNOSIS — J208 Acute bronchitis due to other specified organisms: Secondary | ICD-10-CM

## 2024-04-07 DIAGNOSIS — Z91199 Patient's noncompliance with other medical treatment and regimen due to unspecified reason: Secondary | ICD-10-CM

## 2024-04-07 DIAGNOSIS — J441 Chronic obstructive pulmonary disease with (acute) exacerbation: Secondary | ICD-10-CM

## 2024-04-07 MED ORDER — BENZONATATE 100 MG PO CAPS: 100.0000 mg | ORAL_CAPSULE | Freq: Three times a day (TID) | ORAL | 0 refills | Status: DC | PRN

## 2024-04-07 MED ORDER — DOXYCYCLINE HYCLATE 100 MG PO TABS: 100.0000 mg | ORAL_TABLET | Freq: Two times a day (BID) | ORAL | 0 refills | Status: DC

## 2024-04-07 MED ORDER — PREDNISONE 20 MG PO TABS: 40.0000 mg | ORAL_TABLET | Freq: Every day | ORAL | 0 refills | Status: DC

## 2024-04-07 MED ORDER — ALBUTEROL SULFATE (2.5 MG/3ML) 0.083% IN NEBU: 2.5000 mg | INHALATION_SOLUTION | Freq: Four times a day (QID) | RESPIRATORY_TRACT | 1 refills | Status: AC | PRN

## 2024-04-07 MED ORDER — ALBUTEROL SULFATE HFA 108 (90 BASE) MCG/ACT IN AERS: 1.0000 | INHALATION_SPRAY | Freq: Four times a day (QID) | RESPIRATORY_TRACT | 1 refills | Status: DC | PRN

## 2024-04-07 NOTE — Progress Notes (Signed)
 The patient no-showed for appointment despite this provider sending direct link with no response and waiting for at least 10 minutes from appointment time for patient to join. They will be marked as a NS for this appointment/time.   Piedad Climes, PA-C

## 2024-04-07 NOTE — Progress Notes (Signed)
 We are sorry that you are not feeling well.  Here is how we plan to help!  Based on your presentation I believe you most likely have A cough due to a virus.  This is called viral bronchitis and is best treated by rest, plenty of fluids and control of the cough.  You may use Ibuprofen or Tylenol as directed to help your symptoms.     In addition you may use A prescription cough medication called Tessalon  Perles 100mg . You may take 1-2 capsules every 8 hours as needed for your cough. I have also prescribed a short course of prednisone  to take as directed.  From your responses in the eVisit questionnaire you describe inflammation in the upper respiratory tract which is causing a significant cough.  This is commonly called Bronchitis and has four common causes:   Allergies Viral Infections Acid Reflux Bacterial Infection Allergies, viruses and acid reflux are treated by controlling symptoms or eliminating the cause. An example might be a cough caused by taking certain blood pressure medications. You stop the cough by changing the medication. Another example might be a cough caused by acid reflux. Controlling the reflux helps control the cough.  USE OF BRONCHODILATOR (RESCUE) INHALERS: There is a risk from using your bronchodilator too frequently.  The risk is that over-reliance on a medication which only relaxes the muscles surrounding the breathing tubes can reduce the effectiveness of medications prescribed to reduce swelling and congestion of the tubes themselves.  Although you feel brief relief from the bronchodilator inhaler, your asthma may actually be worsening with the tubes becoming more swollen and filled with mucus.  This can delay other crucial treatments, such as oral steroid medications. If you need to use a bronchodilator inhaler daily, several times per day, you should discuss this with your provider.  There are probably better treatments that could be used to keep your asthma under  control.     HOME CARE Only take medications as instructed by your medical team. Complete the entire course of an antibiotic. Drink plenty of fluids and get plenty of rest. Avoid close contacts especially the very young and the elderly Cover your mouth if you cough or cough into your sleeve. Always remember to wash your hands A steam or ultrasonic humidifier can help congestion.   GET HELP RIGHT AWAY IF: You develop worsening fever. You become short of breath You cough up blood. Your symptoms persist after you have completed your treatment plan MAKE SURE YOU  Understand these instructions. Will watch your condition. Will get help right away if you are not doing well or get worse.  Your e-visit answers were reviewed by a board certified advanced clinical practitioner to complete your personal care plan.  Depending on the condition, your plan could have included both over the counter or prescription medications. If there is a problem please reply  once you have received a response from your provider. Your safety is important to us .  If you have drug allergies check your prescription carefully.    You can use MyChart to ask questions about today's visit, request a non-urgent call back, or ask for a work or school excuse for 24 hours related to this e-Visit. If it has been greater than 24 hours you will need to follow up with your provider, or enter a new e-Visit to address those concerns. You will get an e-mail in the next two days asking about your experience.  I hope that your e-visit has been  valuable and will speed your recovery. Thank you for using e-visits.   I have spent 5 minutes in review of e-visit questionnaire, review and updating patient chart, medical decision making and response to patient.   Elsie Velma Lunger, PA-C

## 2024-04-07 NOTE — Patient Instructions (Signed)
 Enna D Tew, thank you for joining Elsie Velma Lunger, PA-C for today's virtual visit.  While this provider is not your primary care provider (PCP), if your PCP is located in our provider database this encounter information will be shared with them immediately following your visit.   A [place] MyChart account gives you access to today's visit and all your visits, tests, and labs performed at [facility]  click here if you don't have a [facility] MyChart account or go to [email or web address]  Consent: (Patient) [name] provided verbal consent for this virtual visit at the beginning of the encounter.  Current Medications:  Current Outpatient Medications:    Accu-Chek Softclix Lancets lancets, 1 each 3 (three) times daily. (Patient not taking: Reported on 04/01/2024), Disp: , Rfl:    albuterol  (VENTOLIN  HFA) 108 (90 Base) MCG/ACT inhaler, INHALE TWO PUFFS INTO THE LUNGS EVERY SIX  HOURS AS NEEDED FOR WHEEZING OR SHORTNESS OF BREATH., Disp: 8.5 g, Rfl: 1   benzonatate  (TESSALON ) 100 MG capsule, Take 1 capsule (100 mg total) by mouth 3 (three) times daily as needed for cough., Disp: 30 capsule, Rfl: 0   Blood Glucose Monitoring Suppl DEVI, 1 each by Does not apply route in the morning, at noon, and at bedtime. May substitute to any manufacturer covered by patient's insurance. (Patient not taking: Reported on 04/01/2024), Disp: 1 each, Rfl: 0   furosemide  (LASIX ) 20 MG tablet, Take 1 tablet (20 mg total) by mouth daily. (Patient not taking: Reported on 04/01/2024), Disp: 90 tablet, Rfl: 1   metFORMIN  (GLUCOPHAGE -XR) 500 MG 24 hr tablet, Take 1 tablet (500 mg total) by mouth daily with breakfast. (Patient not taking: Reported on 04/01/2024), Disp: 90 tablet, Rfl: 1   predniSONE  (DELTASONE ) 20 MG tablet, Take 2 tablets (40 mg total) by mouth daily with breakfast., Disp: 10 tablet, Rfl: 0   tirzepatide  (MOUNJARO ) 5 MG/0.5ML Pen, Inject 5 mg into the skin once a week.,  Disp: 6 mL, Rfl: 0   traZODone  (DESYREL ) 50 MG tablet, TAKE ONE TABLET (50 MG TOTAL) BY MOUTH AT BEDTIME. (Patient not taking: Reported on 04/01/2024), Disp: 90 tablet, Rfl: 0   valsartan -hydrochlorothiazide  (DIOVAN -HCT) 80-12.5 MG tablet, Take 1 tablet by mouth daily. (Patient not taking: Reported on 04/01/2024), Disp: 90 tablet, Rfl: 1   Vitamin D , Ergocalciferol , (DRISDOL ) 1.25 MG (50000 UNIT) CAPS capsule, Take 1 capsule (50,000 Units total) by mouth every 7 (seven) days. (Patient not taking: Reported on 04/01/2024), Disp: 12 capsule, Rfl: 0   ZEPBOUND  2.5 MG/0.5ML Pen, , Disp: , Rfl:    Medications ordered in this encounter:  No orders of the defined types were placed in this encounter.    *If you need refills on other medications prior to your next appointment, please contact your pharmacy*  Follow-Up: Call back or seek an in-person evaluation if the symptoms worsen or if the condition fails to improve as anticipated.  [facility] [phone]  Other Instructions Please continue to hydrate and rest. Ok to use Mucinex OTC. Start all the prescribed medications as directed. If you note any non-resolving, new, or worsening symptoms despite treatment, please seek an in-person evaluation ASAP.    If you have been instructed to have an in-person evaluation today at a local Urgent Care facility, please use the link below. It will take you to a list of all of our available [facility], including address, phone number and hours of operation. Please do not delay care.  [facility]  If you or a family member do not have a primary care provider, use the link below to schedule a visit and establish care. When you choose a [place] primary care physician or advanced practice provider, you gain a long-term partner in health. Find a Primary Care Provider  Learn more about [facility]'s in-office and virtual care options: [facility] - Get Care Now

## 2024-04-07 NOTE — Progress Notes (Signed)
 Virtual Visit Consent   Josey D Barbara, you are scheduled for a virtual visit with a [facility] provider today. Just as with appointments in the office, your consent must be obtained to participate. Your consent will be active for this visit and any virtual visit you may have with one of our providers in the next 365 days. If you have a MyChart account, a copy of this consent can be sent to you electronically.  As this is a virtual visit, video technology does not allow for your provider to perform a traditional examination. This may limit your provider's ability to fully assess your condition. If your provider identifies any concerns that need to be evaluated in person or the need to arrange testing (such as labs, EKG, etc.), we will make arrangements to do so. Although advances in technology are sophisticated, we cannot ensure that it will always work on either your end or our end. If the connection with a video visit is poor, the visit may have to be switched to a telephone visit. With either a video or telephone visit, we are not always able to ensure that we have a secure connection.  By engaging in this virtual visit, you consent to the provision of healthcare and authorize for your insurance to be billed (if applicable) for the services provided during this visit. Depending on your insurance coverage, you may receive a charge related to this service.  I need to obtain your verbal consent now. Are you willing to proceed with your visit today? Viktoriya D Brege has provided verbal consent on 04/07/2024 for a virtual visit (video or telephone). [name], [place]  Date: 04/07/2024 2:58 PM   Virtual Visit via Video Note   I, [name], connected with  CADIENCE BRADFIELD  ([id number], 03-23-1971) on 04/07/24 at  3:00 PM EST by a video-enabled telemedicine application and verified that I am speaking with the correct person using two identifiers.  Location: Patient: Virtual Visit  Location Patient: Home Provider: Virtual Visit Location Provider: Home Office   I discussed the limitations of evaluation and management by telemedicine and the availability of in person appointments. The patient expressed understanding and agreed to proceed.    History of Present Illness: DENETRIA LUEVANOS is a 53 y.o. who identifies as a female who was assigned female at birth, and is being seen today for 4-5 days of increased work of breathing despite use of her albuterol  inhaler. This has been associated with wheezing, chest tightness and increased sputum production. Is a current smoker but refraining currently due to active symptoms. Notes chest wall tenderness with coughing. Has used her nebulizer solution since running out of her inhaler. Denies recent travel or sick contact. Denies fever, chills, aches. Does not have a pulse Ox. Has not taken anything a home COVID test.   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) 08/15/2023   Vitamin D  deficiency 08/15/2023   Insomnia 08/06/2023   Obesity 08/06/2023   Continuous tobacco abuse 08/06/2023   Osteoarthritis of knee 08/05/2023   Essential hypertension 04/12/2017    Allergies: No Known Allergies Medications:  Current Outpatient Medications:    albuterol  (PROVENTIL ) (2.5 MG/3ML) 0.083% nebulizer solution,  Take 3 mLs (2.5 mg total) by nebulization every 6 (six) hours as needed for wheezing or shortness of breath., Disp: 150 mL, Rfl: 1   doxycycline  (VIBRA -TABS) 100 MG tablet, Take 1 tablet (100 mg total) by mouth 2 (two) times daily., Disp: 14 tablet, Rfl: 0   Accu-Chek Softclix Lancets lancets, 1 each 3 (three) times daily. (Patient not taking: Reported on  04/01/2024), Disp: , Rfl:    benzonatate  (TESSALON ) 100 MG capsule, Take 1 capsule (100 mg total) by mouth 3 (three) times daily as needed for cough., Disp: 30 capsule, Rfl: 0   Blood Glucose Monitoring Suppl DEVI, 1 each by Does not apply route in the morning, at noon, and at bedtime. May substitute to any manufacturer covered by patient's insurance. (Patient not taking: Reported on 04/01/2024), Disp: 1 each, Rfl: 0   furosemide  (LASIX ) 20 MG tablet, Take 1 tablet (20 mg total) by mouth daily. (Patient not taking: Reported on 04/01/2024), Disp: 90 tablet, Rfl: 1   metFORMIN  (GLUCOPHAGE -XR) 500 MG 24 hr tablet, Take 1 tablet (500 mg total) by mouth daily with breakfast. (Patient not taking: Reported on 04/01/2024), Disp: 90 tablet, Rfl: 1   predniSONE  (DELTASONE ) 20 MG tablet, Take 2 tablets (40 mg total) by mouth daily with breakfast., Disp: 10 tablet, Rfl: 0   tirzepatide  (MOUNJARO ) 5 MG/0.5ML Pen, Inject 5 mg into the skin once a week., Disp: 6 mL, Rfl: 0   traZODone  (DESYREL ) 50 MG tablet, TAKE ONE TABLET (50 MG TOTAL) BY MOUTH AT BEDTIME. (Patient not taking: Reported on 04/01/2024), Disp: 90 tablet, Rfl: 0   valsartan -hydrochlorothiazide  (DIOVAN -HCT) 80-12.5 MG tablet, Take 1 tablet by mouth daily. (Patient not taking: Reported on 04/01/2024), Disp: 90 tablet, Rfl: 1   Vitamin D , Ergocalciferol , (DRISDOL ) 1.25 MG (50000 UNIT) CAPS capsule, Take 1 capsule (50,000 Units total) by mouth every 7 (seven) days. (Patient not taking: Reported on 04/01/2024), Disp: 12 capsule, Rfl: 0   ZEPBOUND  2.5 MG/0.5ML Pen, , Disp: , Rfl:   Observations/Objective: Patient is well-developed, well-nourished in no acute distress.  Resting comfortably  at home.  Head is normocephalic, atraumatic.  No labored breathing.  Speech is clear and coherent with logical content.  Patient is alert and oriented at baseline.   Assessment and Plan: 1. COPD exacerbation (HCC) (Primary) - doxycycline  (VIBRA -TABS) 100 MG tablet;  Take 1 tablet (100 mg total) by mouth 2 (two) times daily.  Dispense: 14 tablet; Refill: 0  Seen this morning via e-visit and diagnosed with viral bronchitis and reactive airway exacerbation. At that time tessalon  and prednisone  sent in. After video evaluation, will add on Albuterol  HFA and neb refill. Will also add on delayed antibiotic in case of progressive sputum changes. In person follow-up precautions reviewed with patient.   Follow Up Instructions: I discussed the assessment and treatment plan with the patient. The patient was provided an opportunity to ask questions and all were answered. The patient agreed with the plan and demonstrated an understanding of the instructions.  A copy of instructions were sent to the patient via MyChart unless otherwise noted below.   The patient was advised to call back or seek an in-person evaluation if the symptoms worsen or if the condition fails to improve as anticipated.    Alicia Velma Lunger, PA-C

## 2024-04-15 ENCOUNTER — Other Ambulatory Visit: Payer: MEDICAID

## 2024-04-15 ENCOUNTER — Other Ambulatory Visit: Payer: Self-pay

## 2024-04-15 ENCOUNTER — Telehealth: Payer: Self-pay

## 2024-04-15 NOTE — Patient Outreach (Signed)
 Referral received from Vincente Shivers, NP for assistance with care management needs. This patient is enrolled in [facility] and has associated care management benefits. I reached out to the health plan today to refer the patient to the assigned health plan care management team member.   Interventions:  Martika D Ports was advised to anticipate contact from the health plan for follow up about care management services and resources.  SW contacted [facility] to connect patient with services. SW emailed patient [facility] member services information. Thersia Hoar, HEDWIG, MHA [place]  [facility] Social Worker, Population Health [phone]

## 2024-04-15 NOTE — Patient Outreach (Signed)
 Complex Care Management   Visit Note  04/15/2024  Name:  [name] MRN: [id number] DOB: 03/18/1971  Situation: Referral received for Complex Care Management related to Diabetes with Complications and HTN I obtained verbal consent from Patient.  Visit completed with Patient  on the phone  Background:   Past Medical History:  Diagnosis Date   Anxiety    Arthritis    Cholecystitis with cholelithiasis 02/25/2013   Complication of anesthesia    woke up crying after tubes tied, anxiety attack   Depression    Depression 04/12/2017   GERD (gastroesophageal reflux disease)    Hypertension    Migraine    no longer have these   MRSA carrier     Assessment: Patient Reported Symptoms:  Cognitive        Neurological      HEENT        Cardiovascular      Respiratory Respiratory Symptoms Reported: Wheezing, Shortness of breath Other Respiratory Symptoms: patient had VV 04/07/24 - doxycycline , using neb and inhaler still some SOB and wheeze, smoking much less and reports feeling better, aware sx to call provider and for ED Respiratory Management Strategies: Medication therapy, Routine screening  Endocrine      Gastrointestinal        Genitourinary      Integumentary      Musculoskeletal          Psychosocial            04/15/2024    PHQ2-9 Depression Screening   Little interest or pleasure in doing things    Feeling down, depressed, or hopeless    PHQ-2 - Total Score    Trouble falling or staying asleep, or sleeping too much    Feeling tired or having little energy    Poor appetite or overeating     Feeling bad about yourself - or that you are a failure or have let yourself or your family down    Trouble concentrating on things, such as reading the newspaper or watching television    Moving or speaking so slowly that other people could have noticed.  Or the opposite - being so fidgety or restless that you have been moving around a lot more than usual     Thoughts that you would be better off dead, or hurting yourself in some way    PHQ2-9 Total Score    If you checked off any problems, how difficult have these problems made it for you to do your work, take care of things at home, or get along with other people    Depression Interventions/Treatment      There were no vitals filed for this visit.    Medications Reviewed Today   Medications were not reviewed in this encounter     Recommendation:   PCP Follow-up Continue Current Plan of Care  Follow Up Plan:   Closing From:  Complex Care Management - establish with [facility] Case Manager  Nestora Duos, MSN, RN [facility]  [facility], [facility] RN Care Manager Direct Dial: [phone] Fax: [phone]

## 2024-04-15 NOTE — Patient Instructions (Signed)
 Visit Information  Thank you for taking time to visit with me today. Please don't hesitate to contact me if I can be of assistance to you before our next scheduled appointment.  Your next care management appointment is no further scheduled appointments.    Closing From: Complex Care Management. Establish with [facility] Care Manager  Please call the care guide team at [phone] if you need to cancel, schedule, or reschedule an appointment.   Please call the Suicide and Crisis Lifeline: 988 call the [place]  National Suicide Prevention Lifeline: 980-617-4702 or TTY: 587-679-1912 TTY (727)222-8873) to talk to a trained counselor call 1-800-273-TALK (toll free, 24 hour hotline) go to [facility] [address], [place] [phone]) call 911 if you are experiencing a Mental Health or Behavioral Health Crisis or need someone to talk to.  Nestora Duos, MSN, RN [place]  [facility], [facility] RN Care Manager Direct Dial: [phone] Fax: [phone]  Acute Bronchitis, Adult  Acute bronchitis is when air tubes in the lungs (bronchi) suddenly get swollen. The condition can make it hard for you to breathe. In adults, acute bronchitis usually goes away within 2 Trana Ressler. A cough caused by bronchitis may last up to 3 Debera Sterba. Smoking, allergies, and asthma can make the condition worse. What are the causes? Germs that cause cold and flu (viruses). The most common cause of this condition is the virus that causes the common cold. Bacteria. Substances that bother (irritate) the lungs, including: Smoke from cigarettes and other types of tobacco. Dust and pollen. Fumes from chemicals, gases, or burned fuel. Indoor or outdoor air pollution. What increases the risk? A weak body's defense system. This is also called the immune system. Any condition that affects your lungs and breathing, such as asthma. What are the signs or symptoms? A  cough. Coughing up clear, yellow, or green mucus. Making high-pitched whistling sounds when you breathe, most often when you breathe out (wheezing). Runny or stuffy nose. Having too much mucus in your lungs (chest congestion). Shortness of breath. Body aches. A sore throat. How is this treated? Acute bronchitis may go away over time without treatment. Your doctor may tell you to: Drink more fluids. This will help thin your mucus so it is easier to cough up. Use a device that gets medicine into your lungs (inhaler). Use a vaporizer or a humidifier. These are machines that add water to the air. This helps with coughing and poor breathing. Take a medicine that thins mucus and helps clear it from your lungs. Take a medicine that prevents or stops coughing. It is not common to take an antibiotic medicine for this condition. Follow these instructions at home:  Take over-the-counter and prescription medicines only as told by your doctor. Use an inhaler, vaporizer, or humidifier as told by your doctor. Take two teaspoons (10 mL) of honey at bedtime. This helps lessen your coughing at night. Drink enough fluid to keep your pee (urine) pale yellow. Do not smoke or use any products that contain nicotine or tobacco. If you need help quitting, ask your doctor. Get a lot of rest. Return to your normal activities when your doctor says that it is safe. Keep all follow-up visits. How is this prevented?  Wash your hands often with soap and water for at least 20 seconds. If you cannot use soap and water, use hand sanitizer. Avoid contact with people who have cold symptoms. Try not to touch your mouth, nose, or eyes with your  hands. Avoid breathing in smoke or chemical fumes. Make sure to get the flu shot every year. Contact a doctor if: Your symptoms do not get better in 2 Torren Maffeo. You have trouble coughing up the mucus. Your cough keeps you awake at night. You have a fever. Get help right away  if: You cough up blood. You have chest pain. You have very bad shortness of breath. You faint or keep feeling like you are going to faint. You have a very bad headache. Your fever or chills get worse. These symptoms may be an emergency. Get help right away. Call your local emergency services (911 in the [place] Do not wait to see if the symptoms will go away. Do not drive yourself to the hospital. Summary Acute bronchitis is when air tubes in the lungs (bronchi) suddenly get swollen. In adults, acute bronchitis usually goes away within 2 Porter Nakama. Drink more fluids. This will help thin your mucus so it is easier to cough up. Take over-the-counter and prescription medicines only as told by your doctor. Contact a doctor if your symptoms do not improve after 2 Keenen Roessner of treatment. This information is not intended to replace advice given to you by your health care provider. Make sure you discuss any questions you have with your health care provider. Document Revised: 08/31/2020 Document Reviewed: 08/31/2020 Elsevier Patient Education  2024 [employer].

## 2024-04-15 NOTE — Patient Outreach (Signed)
 RNCM - patient notified CM now with [facility] Tailored Plan - spoke with BSW this morning and then [facility]. Aware [facility] Ccm can no longer follow. Patient assigned [facility] CM and aware to call them back if do not get all from new CM by Friday this week. Has contact info for this RNCM and BSW if unable to connect with [facility] CM. Answered questions and no needs at this time. Closing from [facility] CCM and PCP notified.

## 2024-04-15 NOTE — Patient Instructions (Signed)
 Visit Information  Thank you for taking time to visit with me today.   Tailored Plan Medicaid On November 12, 2022 some people on [place] Medicaid will move to a new kind of Medicaid health plan called a Tailored Plan. Tailored Plans cover your doctor visits, prescription drugs, and health care services.    If your [place] Medicaid will move to a Tailored Plan, you should have gotten a letter and welcome packet. If you're not sure, call your [place] Medicaid Enrollment Broker at [phone] and ask.  Check out these free materials, in Spanish and English, to learn more about your Tailored Plan: [email or web address]/Tailored-Plans/Toolkit  Tailored Care Management Services  TCM services are available to you now. If you are a Tailored Plan member or will be and want information about Tailored Care Management Services including rides to appointments and community and home services, call the Care Management provider for your county of residence:    [facility] [place], [place])  Member Services: [phone] Behavioral Health Crisis Line: [phone], [place], [place], [place], [place])  Member Services: [phone] Behavioral Health Crisis Line: 9158519204     Please call the Suicide and Crisis Lifeline: 988 call the [place]  National Suicide Prevention Lifeline: (704)581-5582 or TTY: (360)559-0160 TTY (641)189-4627) to talk to a trained counselor call 1-800-273-TALK (toll free, 24 hour hotline) call 911 if you are experiencing a Mental Health or Behavioral Health Crisis or need someone to talk to.  Care plan and visit instructions communicated with the patient verbally today. Patient agrees to receive a copy in MyChart. Active MyChart status and patient understanding of how to access instructions and care plan via MyChart confirmed with patient.     Thersia Hoar, HEDWIG, MHA [place]  [facility] Social Worker, Population Health [phone]

## 2024-04-15 NOTE — Progress Notes (Signed)
 Complex Care Management Note  Care Guide Note 04/15/2024 Name: [name] MRN: [id number] DOB: [birth year]  [name] is a 53 y.o. year old female who sees Vincente Shivers, NP for primary care. I reached out to Valita D Velazquez by phone today to offer complex care management services.  [name] was given information about Complex Care Management services today including:   The Complex Care Management services include support from the care team which includes your Nurse Care Manager, Clinical Social Worker, or Pharmacist.  The Complex Care Management team is here to help remove barriers to the health concerns and goals most important to you. Complex Care Management services are voluntary, and the patient may decline or stop services at any time by request to their care team member.   Complex Care Management Consent Status: Patient did not agree to participate in complex care management services at this time.  Follow up plan:  Patient plans to follow up with [facility] RNCM.  Encounter Outcome:  Patient Refused  [name], [facility] Assistant Direct Dial: [phone]  Fax: [phone]

## 2024-04-25 ENCOUNTER — Inpatient Hospital Stay: Admission: RE | Admit: 2024-04-25 | Discharge: 2024-04-25 | Payer: MEDICAID | Attending: Emergency Medicine

## 2024-04-25 ENCOUNTER — Other Ambulatory Visit: Payer: Self-pay

## 2024-04-25 VITALS — BP 177/95 | HR 99 | Temp 97.8°F | Resp 21

## 2024-04-25 DIAGNOSIS — I10 Essential (primary) hypertension: Secondary | ICD-10-CM

## 2024-04-25 DIAGNOSIS — R0602 Shortness of breath: Secondary | ICD-10-CM

## 2024-04-25 DIAGNOSIS — J069 Acute upper respiratory infection, unspecified: Secondary | ICD-10-CM

## 2024-04-25 MED ORDER — PREDNISONE 10 MG PO TABS: 40.0000 mg | ORAL_TABLET | Freq: Every day | ORAL | 0 refills | Status: DC

## 2024-04-25 MED ORDER — VALSARTAN-HYDROCHLOROTHIAZIDE 80-12.5 MG PO TABS: 1.0000 | ORAL_TABLET | Freq: Every day | ORAL | 0 refills | Status: DC

## 2024-04-25 MED ORDER — DOXYCYCLINE HYCLATE 100 MG PO CAPS: 100.0000 mg | ORAL_CAPSULE | Freq: Two times a day (BID) | ORAL | 0 refills | Status: DC

## 2024-04-25 MED ORDER — BENZONATATE 100 MG PO CAPS: 100.0000 mg | ORAL_CAPSULE | Freq: Three times a day (TID) | ORAL | 0 refills | Status: DC | PRN

## 2024-04-25 NOTE — ED Provider Notes (Signed)
 [name]    CSN: [id number] Arrival date & time: 04/25/24  1313      History   Chief Complaint Chief Complaint  Patient presents with   Wheezing    Have had prior visit, gotten worse.  Chest hurts to breathe. Wheezing and congestion. - Entered by patient    HPI [name] is a 53 y.o. female.  Patient presents with 2-week history of congestion and cough.  Her symptoms have worsened in the last 3 to 4 days.  She is wheezing and feels short of breath at times.  No fever or chest pain.  She recently completed a 7-day course of doxycycline .  She has been using her albuterol  inhaler and taking OTC cold medication. Patient had a [facility] telehealth visit on 04/07/2024 for COPD exacerbation and was prescribed doxycycline .  She had a [facility] telehealth visit the same day and was prescribed albuterol  inhaler, Tessalon  Perles, prednisone , cefuroxime.  She [facility] visit the same day and was prescribed Tessalon  Perles and prednisone .  Patient states that the only medications that she took were the doxycycline  and albuterol  inhaler.  She reports she did not get the prednisone  or other medications because her insurance would not cover them because they [place] [facility].  She did not know that [facility] prescribed prednisone  and Tessalon  Perles.   Patient has not taken her blood pressure medication in several months.  She lost her insurance until recently and was not able to afford the medication.  She was last seen by her PCP on 12/20/2023; next appointment scheduled on Monday, 04/27/2024.  Patient's medical history includes hypertension, diabetes, CKD, shortness of breath, continuous tobacco abuse, obesity, osteoarthritis.  Patient reports that she does not have COPD and has never been diagnosed with this.  The history is provided by the patient and medical records.    Past Medical History:  Diagnosis Date   Anxiety    Arthritis     Cholecystitis with cholelithiasis 02/25/2013   Complication of anesthesia    woke up crying after tubes tied, anxiety attack   Depression    Depression 04/12/2017   GERD (gastroesophageal reflux disease)    Hypertension    Migraine    no longer have these   MRSA carrier     Patient Active Problem List   Diagnosis Date Noted   Dysuria 09/17/2023   Shortness of breath 09/17/2023   Chronic otitis externa of both ears 08/20/2023   Encounter for screening involving social determinants of health (SDoH) 08/20/2023   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) 08/15/2023   Vitamin D  deficiency 08/15/2023   Insomnia 08/06/2023   Obesity 08/06/2023   Continuous tobacco abuse 08/06/2023   Osteoarthritis of knee 08/05/2023   Essential hypertension 04/12/2017    Past Surgical History:  Procedure Laterality Date   CHOLECYSTECTOMY     RADIOLOGY WITH ANESTHESIA Bilateral 08/08/2021   Procedure: MRI WITH BILATERAL HIPWITHOUT CONTRAST,RIGHT  KNEE WITHOUT CONTRAST;  Surgeon: Radiologist, Medication, MD;  Location: [facility] OR;  Service: Radiology;  Laterality: Bilateral;   TONSILLECTOMY     TUBAL LIGATION      OB History     Gravida  5   Para  3   Term      Preterm      AB  2   Living  3      SAB  2   IAB  Ectopic      Multiple      Live Births           Obstetric Comments  Menstrual age: 5  Age 1st Pregnancy: 20          Home Medications    Prior to Admission medications  Medication Sig Start Date End Date Taking? Authorizing Provider  benzonatate  (TESSALON ) 100 MG capsule Take 1 capsule (100 mg total) by mouth 3 (three) times daily as needed for cough. 04/25/24  Yes Corlis Burnard DEL, NP  doxycycline  (VIBRAMYCIN ) 100 MG capsule Take 1 capsule (100 mg total) by mouth 2 (two) times daily for 7 days. 04/25/24 05/02/24 Yes Corlis Burnard DEL, NP  predniSONE  (DELTASONE ) 10 MG tablet Take 4 tablets (40 mg total) by mouth daily for 5 days. 04/25/24  04/30/24 Yes Corlis Burnard DEL, NP  Accu-Chek Softclix Lancets lancets 1 each 3 (three) times daily. Patient not taking: Reported on 04/01/2024 08/20/23   [provider]  albuterol  (PROVENTIL ) (2.5 MG/3ML) 0.083% nebulizer solution Take 3 mLs (2.5 mg total) by nebulization every 6 (six) hours as needed for wheezing or shortness of breath. 04/07/24   Gladis Elsie BROCKS, PA-C  Blood Glucose Monitoring Suppl DEVI 1 each by Does not apply route in the morning, at noon, and at bedtime. May substitute to any manufacturer covered by patient's insurance. Patient not taking: Reported on 04/01/2024 08/20/23   Vincente Shivers, NP  furosemide  (LASIX ) 20 MG tablet Take 1 tablet (20 mg total) by mouth daily. Patient not taking: Reported on 04/01/2024 12/20/23 12/19/24  Vincente Shivers, NP  metFORMIN  (GLUCOPHAGE -XR) 500 MG 24 hr tablet Take 1 tablet (500 mg total) by mouth daily with breakfast. Patient not taking: Reported on 04/01/2024 12/20/23   Vincente Shivers, NP  tirzepatide  (MOUNJARO ) 5 MG/0.5ML Pen Inject 5 mg into the skin once a week. Patient not taking: Reported on 04/25/2024 01/10/24   Vincente Shivers, NP  traZODone  (DESYREL ) 50 MG tablet TAKE ONE TABLET (50 MG TOTAL) BY MOUTH AT BEDTIME. Patient not taking: Reported on 04/01/2024 03/25/24   Vincente Shivers, NP  valsartan -hydrochlorothiazide  (DIOVAN -HCT) 80-12.5 MG tablet Take 1 tablet by mouth daily. 04/25/24   Corlis Burnard DEL, NP  Vitamin D , Ergocalciferol , (DRISDOL ) 1.25 MG (50000 UNIT) CAPS capsule Take 1 capsule (50,000 Units total) by mouth every 7 (seven) days. Patient not taking: Reported on 04/01/2024 08/15/23   Vincente Shivers, NP  ZEPBOUND  2.5 MG/0.5ML Pen  12/18/23   [provider]    Family History History reviewed. No pertinent family history.  Social History Social History[1]   Allergies   Patient has no known allergies.   Review of Systems Review of Systems  Constitutional:  Negative for chills and fever.  HENT:  Positive for  congestion. Negative for ear pain and sore throat.   Respiratory:  Positive for cough, chest tightness, shortness of breath and wheezing.   Cardiovascular:  Negative for chest pain and palpitations.     Physical Exam Triage Vital Signs ED Triage Vitals  Encounter Vitals Group     BP      Girls Systolic BP Percentile      Girls Diastolic BP Percentile      Boys Systolic BP Percentile      Boys Diastolic BP Percentile      Pulse      Resp      Temp      Temp src      SpO2      Weight  Height      Head Circumference      Peak Flow      Pain Score      Pain Loc      Pain Education      Exclude from Growth Chart    No data found.  Updated Vital Signs BP (!) 177/95   Pulse 99   Temp 97.8 F (36.6 C) (Temporal)   Resp (!) 21   LMP 01/30/2016   SpO2 99%   Visual Acuity Right Eye Distance:   Left Eye Distance:   Bilateral Distance:    Right Eye Near:   Left Eye Near:    Bilateral Near:     Physical Exam Constitutional:      General: She is not in acute distress. HENT:     Right Ear: Tympanic membrane normal.     Left Ear: Tympanic membrane normal.     Nose: Congestion present.     Mouth/Throat:     Mouth: Mucous membranes are moist.     Pharynx: Oropharynx is clear.  Cardiovascular:     Rate and Rhythm: Normal rate and regular rhythm.     Heart sounds: Normal heart sounds.  Pulmonary:     Effort: Pulmonary effort is normal. No respiratory distress.     Breath sounds: Normal breath sounds. Decreased air movement present.  Neurological:     Mental Status: She is alert.      UC Treatments / Results  Labs (all labs ordered are listed, but only abnormal results are displayed) Labs Reviewed - No data to display  EKG   Radiology No results found.  Procedures Procedures (including critical care time)  Medications Ordered in UC Medications - No data to display  Initial Impression / Assessment and Plan / UC Course  I have reviewed the triage  vital signs and the nursing notes.  Pertinent labs & imaging results that were available during my care of the patient were reviewed by me and considered in my medical decision making (see chart for details).    Shortness of breath, acute upper respiratory infection, elevated blood pressure reading with hypertension.  Afebrile.  Blood pressure 179/106 and repeated 177/95.  O2 sat 99% on room air.  Patient recently completed a 7-day course of doxycycline  but did not take prednisone  or other medications prescribed on telehealth at that time.  Treating today with prednisone  and additional 7-day course of doxycycline .  Patient reports her albuterol  inhaler is in date and has plenty of activations.  Education provided on shortness of breath and URI.  Patient has not taken her blood pressure medication in several months.  She lost her insurance and was not able to afford it.  She recently obtained insurance again.  30-day supply of her blood pressure medication sent to pharmacy today.  Education provided on managing hypertension.  She has an appointment scheduled with her PCP on Monday.  ED precautions discussed.  Patient agrees to plan of care.  Final Clinical Impressions(s) / UC Diagnoses   Final diagnoses:  Shortness of breath  Elevated blood pressure reading in office with diagnosis of hypertension  Acute upper respiratory infection     Discharge Instructions      Follow up with your primary care provider on Monday as scheduled.  Go to the emergency department if you have worsening symptoms.    A 30-day supply of your blood pressure medication was sent to your pharmacy today.  See the attached information on managing hypertension.  Take the doxycycline , prednisone , and Tessalon  Perles as directed.  Use your albuterol  inhaler as directed.      ED Prescriptions     Medication Sig Dispense Auth. Provider   valsartan -hydrochlorothiazide  (DIOVAN -HCT) 80-12.5 MG tablet Take 1 tablet by  mouth daily. 30 tablet Corlis Burnard DEL, NP   predniSONE  (DELTASONE ) 10 MG tablet Take 4 tablets (40 mg total) by mouth daily for 5 days. 20 tablet Corlis Burnard DEL, NP   doxycycline  (VIBRAMYCIN ) 100 MG capsule Take 1 capsule (100 mg total) by mouth 2 (two) times daily for 7 days. 14 capsule Corlis Burnard DEL, NP   benzonatate  (TESSALON ) 100 MG capsule Take 1 capsule (100 mg total) by mouth 3 (three) times daily as needed for cough. 21 capsule Corlis Burnard DEL, NP      PDMP not reviewed this encounter.    [1]  Social History Tobacco Use   Smoking status: Every Day    Current packs/day: 1.00    Average packs/day: 1 pack/day for 32.9 years (32.9 ttl pk-yrs)    Types: Cigarettes    Start date: 1993   Smokeless tobacco: Never   Tobacco comments:    04/01/2024 1.5 ppd - interested in quitting, 1-800-quit-now  Vaping Use   Vaping status: Never Used  Substance Use Topics   Alcohol use: Not Currently   Drug use: Yes    Comment: THC     Corlis Burnard DEL, NP 04/25/24 1408

## 2024-04-25 NOTE — Discharge Instructions (Addendum)
 Follow up with your primary care provider on Monday as scheduled.  Go to the emergency department if you have worsening symptoms.    A 30-day supply of your blood pressure medication was sent to your pharmacy today.  See the attached information on managing hypertension.    Take the doxycycline , prednisone , and Tessalon  Perles as directed.  Use your albuterol  inhaler as directed.

## 2024-04-25 NOTE — ED Triage Notes (Signed)
 Pt being seen in UC for wheezing, coughing, nasal congestion, and chest discomfort when breathing in and laying down. Pt reports s/s started approx 2 weeks ago. Pt reports using rescue inhaler and otc medication with no relief. Pt denies known fevers. Pt reports recently completing full course of doxycycline .

## 2024-04-27 ENCOUNTER — Telehealth: Payer: Self-pay | Admitting: *Deleted

## 2024-04-27 ENCOUNTER — Other Ambulatory Visit (HOSPITAL_COMMUNITY): Payer: Self-pay

## 2024-04-27 ENCOUNTER — Encounter: Payer: Self-pay | Admitting: General Practice

## 2024-04-27 ENCOUNTER — Ambulatory Visit: Payer: MEDICAID | Admitting: General Practice

## 2024-04-27 ENCOUNTER — Telehealth: Payer: Self-pay

## 2024-04-27 VITALS — BP 132/86 | HR 107 | Temp 98.8°F | Ht 70.0 in | Wt 328.0 lb

## 2024-04-27 DIAGNOSIS — Z6841 Body Mass Index (BMI) 40.0 and over, adult: Secondary | ICD-10-CM

## 2024-04-27 DIAGNOSIS — I10 Essential (primary) hypertension: Secondary | ICD-10-CM

## 2024-04-27 DIAGNOSIS — G4700 Insomnia, unspecified: Secondary | ICD-10-CM

## 2024-04-27 DIAGNOSIS — R200 Anesthesia of skin: Secondary | ICD-10-CM

## 2024-04-27 DIAGNOSIS — R202 Paresthesia of skin: Secondary | ICD-10-CM

## 2024-04-27 DIAGNOSIS — E1122 Type 2 diabetes mellitus with diabetic chronic kidney disease: Secondary | ICD-10-CM

## 2024-04-27 DIAGNOSIS — N181 Chronic kidney disease, stage 1: Secondary | ICD-10-CM | POA: Diagnosis not present

## 2024-04-27 DIAGNOSIS — M170 Bilateral primary osteoarthritis of knee: Secondary | ICD-10-CM | POA: Diagnosis not present

## 2024-04-27 DIAGNOSIS — M79602 Pain in left arm: Secondary | ICD-10-CM

## 2024-04-27 DIAGNOSIS — M79601 Pain in right arm: Secondary | ICD-10-CM | POA: Diagnosis not present

## 2024-04-27 DIAGNOSIS — E66813 Obesity, class 3: Secondary | ICD-10-CM

## 2024-04-27 DIAGNOSIS — Z720 Tobacco use: Secondary | ICD-10-CM

## 2024-04-27 MED ORDER — MOUNJARO 5 MG/0.5ML ~~LOC~~ SOAJ: 5.0000 mg | SUBCUTANEOUS | 0 refills | Status: DC

## 2024-04-27 MED ORDER — METFORMIN HCL ER 500 MG PO TB24: 500.0000 mg | ORAL_TABLET | Freq: Every day | ORAL | 1 refills | Status: AC

## 2024-04-27 MED ORDER — TRAZODONE HCL 100 MG PO TABS: 100.0000 mg | ORAL_TABLET | Freq: Every day | ORAL | 0 refills | Status: DC

## 2024-04-27 MED ORDER — FUROSEMIDE 20 MG PO TABS: 20.0000 mg | ORAL_TABLET | Freq: Every day | ORAL | 1 refills | Status: AC

## 2024-04-27 NOTE — Patient Instructions (Addendum)
 Stop by the lab prior to leaving today. I will notify you of your results once received.    Resume Mounjaro  5 mg once weekly.   Resume Metformin .   Resume lasix .   Wear compression stockings to help with swelling.   [facility] Address: [address] [place], [place] [address] Phone: [phone]  I will be in touch regarding your labs.   F/u 3 weeks in the office.   It was a pleasure to see you today!

## 2024-04-27 NOTE — Assessment & Plan Note (Signed)
 Smoking cessation instruction/counseling given:  counseled patient on the dangers of tobacco use, advised patient to stop smoking, and reviewed strategies to maximize success

## 2024-04-27 NOTE — Telephone Encounter (Signed)
 Received notification that PA is needed on mounjaro  5 mg/0.5 ml from covermymeds. KEY: [id number]

## 2024-04-27 NOTE — Progress Notes (Signed)
 Established Patient Office Visit  Subjective   Patient ID: [name], female    DOB: [birth year]  Age: 53 y.o. MRN: [id number]  Chief Complaint  Patient presents with   Cough    With wheezing/rattle in chest. Went to [facility] Saturday and started prednisone , tessalon , and doxycycline . Patient is feeling better some since then.    Arm Pain    X 5 days. Patient gets extreme pain in her lower arms especially at night to where she can't sleep. Patient states she's also had the numbness still she was having before.     Cough Pertinent negatives include no chest pain, chills, fever, headaches, heartburn or shortness of breath.  Arm Pain  Pertinent negatives include no chest pain.    [name] is a 53 year old female with past medical history of HTN, DM2, OA, tobacco abuse, obesity presents today for an acute visit.   Discussed the use of AI scribe software for clinical note transcription with the patient, who gave verbal consent to proceed.  History of Present Illness Alicia MCCULLEY Higgins is a 53 year old female with diabetes who presents with bilateral arm pain and numbness.  She has been experiencing bilateral arm pain for the past five days, described as difficult to characterize, with associated weakness, numbness, and tingling in her fingers. She has a history of numbness in three fingers for over a year. Various treatments, including ibuprofen, Tylenol , heat, ice, and massage, have not provided relief.  She has been off her diabetes medications since November. She was prescribed valsartan  for hypertension at urgent care, along with trazodone  and an albuterol  inhaler. With Medicaid coverage now, she is resuming her medications, including metformin  and Lasix .  She reports significant sleep disturbances, falling asleep at 2 or 3 AM and waking up by 5 AM, ongoing for the past five days. She works from 8 AM to 4:30 PM and finds it difficult to function with the lack of sleep.  She  has a history of hypertension, which was elevated at 177/95 at urgent care, but has since improved to 140/95. She has experienced significant weight loss, from 343 lbs in August to 328 lbs currently.  She has a history of knee pain and osteoarthritis, with previous MRI findings indicating advanced osteoarthritis and meniscus degeneration. She uses a wheelchair due to the pain.  She lost her sister to a heart attack in October, and her mother is currently caring for her sister's daughter in [place] . She has support from her ex, who helps with meal preparation.    Patient Active Problem List   Diagnosis Date Noted   Bilateral arm pain 04/27/2024   Dysuria 09/17/2023   Shortness of breath 09/17/2023   Chronic otitis externa of both ears 08/20/2023   Encounter for screening involving social determinants of health (SDoH) 08/20/2023   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) 08/15/2023   Vitamin D  deficiency 08/15/2023   Insomnia 08/06/2023   Obesity 08/06/2023   Continuous tobacco abuse 08/06/2023   Osteoarthritis of knee 08/05/2023   Essential hypertension 04/12/2017   Past Medical History:  Diagnosis Date   Anxiety    Arthritis    Cholecystitis with cholelithiasis 02/25/2013   Complication of anesthesia    woke up crying after tubes tied, anxiety attack   Depression    Depression 04/12/2017   GERD (gastroesophageal reflux disease)    Hypertension    Migraine    no longer have these  MRSA carrier    Past Surgical History:  Procedure Laterality Date   CHOLECYSTECTOMY     RADIOLOGY WITH ANESTHESIA Bilateral 08/08/2021   Procedure: MRI WITH BILATERAL HIPWITHOUT CONTRAST,RIGHT  KNEE WITHOUT CONTRAST;  Surgeon: Radiologist, Medication, MD;  Location: [facility] OR;  Service: Radiology;  Laterality: Bilateral;   TONSILLECTOMY     TUBAL LIGATION     Allergies[1]       04/27/2024    2:35 PM 04/01/2024   12:07 PM 12/20/2023    9:13 AM  Depression screen PHQ 2/9   Decreased Interest 3 3 1   Down, Depressed, Hopeless 3 3 1   PHQ - 2 Score 6 6 2   Altered sleeping 3 3 3   Tired, decreased energy 3 3 1   Change in appetite 1 3 1   Feeling bad or failure about yourself  0 3 1  Trouble concentrating 3 3 1   Moving slowly or fidgety/restless 0 0 0  Suicidal thoughts 2 0 0  PHQ-9 Score 18 21 9    Difficult doing work/chores Extremely dIfficult Very difficult Somewhat difficult     Data saved with a previous flowsheet row definition       04/27/2024    2:35 PM 12/20/2023    9:14 AM 09/17/2023   11:43 AM 08/20/2023   11:02 AM  GAD 7 : Generalized Anxiety Score  Nervous, Anxious, on Edge 3 2 2 1   Control/stop worrying 3 2 2 2   Worry too much - different things 3 2 2 2   Trouble relaxing 3 2 3 2   Restless 3 2 0 0  Easily annoyed or irritable 3 2 2 2   Afraid - awful might happen 2 2 0 2  Total GAD 7 Score 20 14 11 11   Anxiety Difficulty Extremely difficult Somewhat difficult Somewhat difficult Very difficult      Review of Systems  Constitutional:  Negative for chills and fever.  Respiratory:  Positive for cough. Negative for shortness of breath.   Cardiovascular:  Negative for chest pain.  Gastrointestinal:  Negative for abdominal pain, constipation, diarrhea, heartburn, nausea and vomiting.  Genitourinary:  Negative for dysuria, frequency and urgency.  Neurological:  Negative for dizziness and headaches.  Endo/Heme/Allergies:  Negative for polydipsia.  Psychiatric/Behavioral:  Negative for depression and suicidal ideas. The patient is not nervous/anxious.       Objective:     BP 132/86   Pulse (!) 107   Temp 98.8 F (37.1 C) (Temporal)   Ht 5' 10 (1.778 m)   Wt (!) 328 lb (148.8 kg)   LMP 01/30/2016   SpO2 96%   BMI 47.06 kg/m  BP Readings from Last 3 Encounters:  04/27/24 132/86  04/25/24 (!) 177/95  12/20/23 (!) 174/96   Wt Readings from Last 3 Encounters:  04/27/24 (!) 328 lb (148.8 kg)  12/20/23 (!) 343 lb 8 oz (155.8 kg)   10/08/23 300 lb (136.1 kg)      Physical Exam Vitals and nursing note reviewed.  Constitutional:      Appearance: Normal appearance.  Cardiovascular:     Rate and Rhythm: Normal rate and regular rhythm.     Pulses: Normal pulses.     Heart sounds: Normal heart sounds.  Pulmonary:     Effort: Pulmonary effort is normal.     Breath sounds: Normal breath sounds.  Neurological:     Mental Status: She is alert and oriented to person, place, and time.  Psychiatric:        Mood and Affect: Mood  normal.        Behavior: Behavior normal.        Thought Content: Thought content normal.        Judgment: Judgment normal.      No results found for any visits on 04/27/24.     The ASCVD Risk score (Arnett DK, et al., 2019) failed to calculate for the following reasons:   The valid total cholesterol range is 130 to 320 mg/dL    Assessment & Plan:  Bilateral arm pain  Type 2 diabetes mellitus with stage 1 chronic kidney disease, unspecified whether long term insulin  use (HCC) -     Mounjaro ; Inject 5 mg into the skin once a week.  Dispense: 6 mL; Refill: 0 -     metFORMIN  HCl ER; Take 1 tablet (500 mg total) by mouth daily with breakfast.  Dispense: 90 tablet; Refill: 1  Essential hypertension -     Furosemide ; Take 1 tablet (20 mg total) by mouth daily.  Dispense: 90 tablet; Refill: 1 -     CBC -     Comprehensive metabolic panel with GFR  Numbness and tingling -     Vitamin B12 -     VITAMIN D  25 Hydroxy (Vit-D Deficiency, Fractures) -     Hemoglobin A1c -     TSH  Insomnia, unspecified type -     traZODone  HCl; Take 1 tablet (100 mg total) by mouth at bedtime.  Dispense: 30 tablet; Refill: 0  Class 3 severe obesity due to excess calories with serious comorbidity and body mass index (BMI) of 45.0 to 49.9 in adult [facility])  Primary osteoarthritis of both knees  Continuous tobacco abuse Assessment & Plan: Smoking cessation instruction/counseling given:  counseled patient on  the dangers of tobacco use, advised patient to stop smoking, and reviewed strategies to maximize success      Assessment and Plan Assessment & Plan Bilateral arm pain with numbness and tingling Suspected vitamin B12 deficiency due to symptoms extending to fingertips. Steroids ineffective, suggesting it is not carpal tunnel syndrome. - Ordered vitamin B12 level. - Consider referral to orthopedic specialist if symptoms persist. - Consider referral to neurology if symptoms persist.  Type 2 diabetes mellitus with stage 1 chronic kidney disease Diabetes management interrupted since November, potentially worsening glycemic control and neuropathy. Current symptoms may relate to diabetic neuropathy. - A1c pending.  - Resumed metformin . - Resume Mounjaro  5 mg once weekly.   Essential hypertension Blood pressure elevated with recent readings of 177/95. - Continue valsartan -hydrochlorothiazide . - Monitor blood pressure.  Insomnia Exacerbated by recent illness and pain. Current trazodone  dose may be insufficient. - Increased trazodone  to 100 mg at bedtime.  Class 3 severe obesity due to excess calories Weight decreased from 343 lbs to 328 lbs. Mounjaro  effective in weight management. Off Mounjaro  for a month, cautious about increasing dose too quickly. - Resumed Mounjaro  5 mg once weekly.  Osteoarthritis of knee Chronic knee pain with osteoarthritis. Previous MRI showed advanced osteoarthritis and medial meniscus degeneration. Physical therapy recommended but she is hesitant. - Encouraged physical therapy. - Consider referral to orthopedic specialist for further evaluation.   Return in about 2 weeks (around 05/11/2024) for bilateral arm pain. SABRA Carrol Aurora, NP    [1] No Known Allergies

## 2024-04-27 NOTE — Telephone Encounter (Signed)
 Pharmacy Patient Advocate Encounter   Received notification from Patient Pharmacy that prior authorization for Mounjaro  5 mg/0.5 ml auto injector is required/requested.   Insurance verification completed.   The patient is insured through [employer] .   Per test claim: PA required; PA submitted to above mentioned insurance via Latent Key/confirmation #/EOC [id number] Status is pending

## 2024-04-28 ENCOUNTER — Ambulatory Visit: Payer: Self-pay | Admitting: General Practice

## 2024-04-28 ENCOUNTER — Telehealth: Payer: Self-pay

## 2024-04-28 DIAGNOSIS — E538 Deficiency of other specified B group vitamins: Secondary | ICD-10-CM

## 2024-04-28 DIAGNOSIS — M79601 Pain in right arm: Secondary | ICD-10-CM

## 2024-04-28 DIAGNOSIS — E559 Vitamin D deficiency, unspecified: Secondary | ICD-10-CM

## 2024-04-28 LAB — CBC
HCT: 46.8 % — ABNORMAL HIGH (ref 36.0–46.0)
Hemoglobin: 15.5 g/dL — ABNORMAL HIGH (ref 12.0–15.0)
MCHC: 33.2 g/dL (ref 30.0–36.0)
MCV: 88.8 fl (ref 78.0–100.0)
Platelets: 391 K/uL (ref 150.0–400.0)
RBC: 5.27 Mil/uL — ABNORMAL HIGH (ref 3.87–5.11)
RDW: 14.6 % (ref 11.5–15.5)
WBC: 18 K/uL (ref 4.0–10.5)

## 2024-04-28 LAB — COMPREHENSIVE METABOLIC PANEL WITH GFR
ALT: 15 U/L (ref 0–35)
AST: 11 U/L (ref 5–37)
Albumin: 4.3 g/dL (ref 3.5–5.2)
Alkaline Phosphatase: 86 U/L (ref 39–117)
BUN: 15 mg/dL (ref 6–23)
CO2: 27 meq/L (ref 19–32)
Calcium: 10.2 mg/dL (ref 8.4–10.5)
Chloride: 97 meq/L (ref 96–112)
Creatinine, Ser: 0.89 mg/dL (ref 0.40–1.20)
GFR: 74.19 mL/min (ref >60.00)
Glucose, Bld: 200 mg/dL — ABNORMAL HIGH (ref 70–99)
Potassium: 4.5 meq/L (ref 3.5–5.1)
Sodium: 135 meq/L (ref 135–145)
Total Bilirubin: 0.4 mg/dL (ref 0.2–1.2)
Total Protein: 7.7 g/dL (ref 6.0–8.3)

## 2024-04-28 LAB — HEMOGLOBIN A1C: Hgb A1c MFr Bld: 6.2 % (ref 4.6–6.5)

## 2024-04-28 LAB — VITAMIN D 25 HYDROXY (VIT D DEFICIENCY, FRACTURES): VITD: 7.24 ng/mL — ABNORMAL LOW (ref 30.00–100.00)

## 2024-04-28 LAB — TSH: TSH: 0.7 u[IU]/mL (ref 0.35–5.50)

## 2024-04-28 LAB — VITAMIN B12: Vitamin B-12: 131 pg/mL — ABNORMAL LOW (ref 211–911)

## 2024-04-28 MED ORDER — GABAPENTIN 100 MG PO CAPS: 100.0000 mg | ORAL_CAPSULE | Freq: Two times a day (BID) | ORAL | 3 refills | Status: DC

## 2024-04-28 MED ORDER — VITAMIN B-12 1000 MCG PO TABS: 1000.0000 ug | ORAL_TABLET | Freq: Every day | ORAL | 0 refills | Status: AC

## 2024-04-28 MED ORDER — VITAMIN D (ERGOCALCIFEROL) 1.25 MG (50000 UNIT) PO CAPS: 50000.0000 [IU] | ORAL_CAPSULE | ORAL | 1 refills | Status: AC

## 2024-04-28 NOTE — Telephone Encounter (Signed)
 Critical results received from Darice at [facility]; WBC 18.0.

## 2024-04-28 NOTE — Telephone Encounter (Signed)
 Copied from CRM #[id number]. Topic: Clinical - Lab/Test Results >> Apr 28, 2024 10:22 AM Franky GRADE wrote: Reason for CRM: Patient returning a call she received to review labs, called [facility] and was advised to send a CRM back. Notes state Critical labs.

## 2024-04-28 NOTE — Telephone Encounter (Signed)
Left message for patient to discuss lab results.

## 2024-04-28 NOTE — Telephone Encounter (Signed)
 Results were given to patient by Carrol Aurora, DNP, AGNP-C and sent to the hospital for evaluation. Work note requested and uploaded to patients mychart.

## 2024-04-29 ENCOUNTER — Other Ambulatory Visit (HOSPITAL_COMMUNITY): Payer: Self-pay

## 2024-04-29 NOTE — Telephone Encounter (Signed)
 PA request has been Approved. New Encounter has been or will be created for follow up. For additional info see Pharmacy Prior Auth telephone encounter from 04-28-2024.

## 2024-04-29 NOTE — Telephone Encounter (Signed)
 Pharmacy Patient Advocate Encounter  Received notification from [facility] that Prior Authorization for Mounjaro  5 mg/0.5 ml auto injector has been APPROVED from 04/27/2024 to 04/27/2025. Ran test claim, Copay is $4. This test claim was processed through [facility]- copay amounts may vary at other pharmacies due to pharmacy/plan contracts, or as the patient moves through the different stages of their insurance plan.   PA #/Case ID/Reference #: [id number]

## 2024-04-30 ENCOUNTER — Other Ambulatory Visit: Payer: Self-pay

## 2024-04-30 ENCOUNTER — Inpatient Hospital Stay
Admission: EM | Admit: 2024-04-30 | Discharge: 2024-05-03 | DRG: 176 | Disposition: A | Discharge status: Home / Self Care | Payer: MEDICAID | Attending: Obstetrics and Gynecology | Admitting: Obstetrics and Gynecology

## 2024-04-30 ENCOUNTER — Emergency Department: Payer: MEDICAID

## 2024-04-30 DIAGNOSIS — E1122 Type 2 diabetes mellitus with diabetic chronic kidney disease: Secondary | ICD-10-CM | POA: Diagnosis present

## 2024-04-30 DIAGNOSIS — E538 Deficiency of other specified B group vitamins: Secondary | ICD-10-CM

## 2024-04-30 DIAGNOSIS — Z7901 Long term (current) use of anticoagulants: Secondary | ICD-10-CM

## 2024-04-30 DIAGNOSIS — I82442 Acute embolism and thrombosis of left tibial vein: Secondary | ICD-10-CM | POA: Diagnosis present

## 2024-04-30 DIAGNOSIS — Z7984 Long term (current) use of oral hypoglycemic drugs: Secondary | ICD-10-CM

## 2024-04-30 DIAGNOSIS — E66813 Obesity, class 3: Secondary | ICD-10-CM | POA: Diagnosis present

## 2024-04-30 DIAGNOSIS — I10 Essential (primary) hypertension: Secondary | ICD-10-CM | POA: Diagnosis present

## 2024-04-30 DIAGNOSIS — Z6841 Body Mass Index (BMI) 40.0 and over, adult: Secondary | ICD-10-CM

## 2024-04-30 DIAGNOSIS — E1165 Type 2 diabetes mellitus with hyperglycemia: Secondary | ICD-10-CM | POA: Diagnosis not present

## 2024-04-30 DIAGNOSIS — I2699 Other pulmonary embolism without acute cor pulmonale: Secondary | ICD-10-CM | POA: Diagnosis not present

## 2024-04-30 DIAGNOSIS — Z7985 Long-term (current) use of injectable non-insulin antidiabetic drugs: Secondary | ICD-10-CM

## 2024-04-30 DIAGNOSIS — N181 Chronic kidney disease, stage 1: Secondary | ICD-10-CM | POA: Diagnosis present

## 2024-04-30 DIAGNOSIS — J449 Chronic obstructive pulmonary disease, unspecified: Secondary | ICD-10-CM | POA: Diagnosis not present

## 2024-04-30 DIAGNOSIS — F419 Anxiety disorder, unspecified: Secondary | ICD-10-CM | POA: Diagnosis present

## 2024-04-30 DIAGNOSIS — I129 Hypertensive chronic kidney disease with stage 1 through stage 4 chronic kidney disease, or unspecified chronic kidney disease: Secondary | ICD-10-CM | POA: Diagnosis present

## 2024-04-30 DIAGNOSIS — K219 Gastro-esophageal reflux disease without esophagitis: Secondary | ICD-10-CM | POA: Diagnosis present

## 2024-04-30 DIAGNOSIS — Z79899 Other long term (current) drug therapy: Secondary | ICD-10-CM

## 2024-04-30 DIAGNOSIS — E669 Obesity, unspecified: Secondary | ICD-10-CM | POA: Diagnosis present

## 2024-04-30 DIAGNOSIS — F32A Depression, unspecified: Secondary | ICD-10-CM | POA: Diagnosis present

## 2024-04-30 DIAGNOSIS — M1711 Unilateral primary osteoarthritis, right knee: Secondary | ICD-10-CM | POA: Diagnosis present

## 2024-04-30 DIAGNOSIS — F17210 Nicotine dependence, cigarettes, uncomplicated: Secondary | ICD-10-CM | POA: Diagnosis present

## 2024-04-30 LAB — BASIC METABOLIC PANEL WITH GFR
Anion gap: 12 (ref 5–15)
BUN: 19 mg/dL (ref 6–20)
CO2: 24 mmol/L (ref 22–32)
Calcium: 9.3 mg/dL (ref 8.9–10.3)
Chloride: 100 mmol/L (ref 98–111)
Creatinine, Ser: 0.99 mg/dL (ref 0.44–1.00)
GFR, Estimated: >60 mL/min (ref >60)
Glucose, Bld: 150 mg/dL — ABNORMAL HIGH (ref 70–99)
Potassium: 4.1 mmol/L (ref 3.5–5.1)
Sodium: 135 mmol/L (ref 135–145)

## 2024-04-30 LAB — CBC
HCT: 48.9 % — ABNORMAL HIGH (ref 36.0–46.0)
Hemoglobin: 16.2 g/dL — ABNORMAL HIGH (ref 12.0–15.0)
MCH: 29.7 pg (ref 26.0–34.0)
MCHC: 33.1 g/dL (ref 30.0–36.0)
MCV: 89.7 fL (ref 80.0–100.0)
Platelets: 374 K/uL (ref 150–400)
RBC: 5.45 MIL/uL — ABNORMAL HIGH (ref 3.87–5.11)
RDW: 14.5 % (ref 11.5–15.5)
WBC: 19.6 K/uL — ABNORMAL HIGH (ref 4.0–10.5)
nRBC: 0 % (ref 0.0–0.2)

## 2024-04-30 LAB — APTT: aPTT: 29 s (ref 24–36)

## 2024-04-30 LAB — LACTIC ACID, PLASMA: Lactic Acid, Venous: 1.3 mmol/L (ref 0.5–1.9)

## 2024-04-30 LAB — PROTIME-INR
INR: 1 (ref 0.8–1.2)
Prothrombin Time: 14.2 s (ref 11.4–15.2)

## 2024-04-30 LAB — GLUCOSE, CAPILLARY: Glucose-Capillary: 193 mg/dL — ABNORMAL HIGH (ref 70–99)

## 2024-04-30 LAB — TROPONIN T, HIGH SENSITIVITY
Troponin T High Sensitivity: 17 ng/L (ref 0–19)
Troponin T High Sensitivity: 16 ng/L (ref 0–19)

## 2024-04-30 MED ORDER — SODIUM CHLORIDE 0.9 % IV BOLUS
1000.0000 mL | Freq: Once | INTRAVENOUS | Status: AC
  Administered 2024-04-30: 17:00:00 1000 mL via INTRAVENOUS

## 2024-04-30 MED ORDER — ACETAMINOPHEN 650 MG RE SUPP: 650.0000 mg | Freq: Four times a day (QID) | RECTAL | Status: DC | PRN

## 2024-04-30 MED ORDER — ALBUTEROL SULFATE (2.5 MG/3ML) 0.083% IN NEBU
2.5000 mg | INHALATION_SOLUTION | Freq: Four times a day (QID) | RESPIRATORY_TRACT | Status: DC | PRN
  Administered 2024-05-01: 2.5 mg via RESPIRATORY_TRACT
  Filled 2024-04-30: qty 3

## 2024-04-30 MED ORDER — VITAMIN B-12 1000 MCG PO TABS
1000.0000 ug | ORAL_TABLET | Freq: Every day | ORAL | Status: DC
  Administered 2024-05-01 – 2024-05-03 (×3): 1000 ug via ORAL
  Filled 2024-04-30 (×3): qty 1

## 2024-04-30 MED ORDER — ONDANSETRON HCL 4 MG/2ML IJ SOLN: 4.0000 mg | Freq: Four times a day (QID) | INTRAMUSCULAR | Status: DC | PRN

## 2024-04-30 MED ORDER — INSULIN ASPART 100 UNIT/ML IJ SOLN
0.0000 [IU] | Freq: Three times a day (TID) | INTRAMUSCULAR | Status: DC
  Administered 2024-05-01: 2 [IU] via SUBCUTANEOUS
  Administered 2024-05-01: 3 [IU] via SUBCUTANEOUS
  Administered 2024-05-01 – 2024-05-03 (×3): 2 [IU] via SUBCUTANEOUS
  Filled 2024-04-30: qty 2
  Filled 2024-04-30: qty 3
  Filled 2024-04-30 (×3): qty 2

## 2024-04-30 MED ORDER — HYDROMORPHONE HCL 1 MG/ML IJ SOLN
0.5000 mg | INTRAMUSCULAR | Status: DC | PRN
  Administered 2024-04-30 – 2024-05-03 (×13): 0.5 mg via INTRAVENOUS
  Filled 2024-04-30 (×13): qty 0.5

## 2024-04-30 MED ORDER — VALSARTAN-HYDROCHLOROTHIAZIDE 80-12.5 MG PO TABS: 1.0000 | ORAL_TABLET | Freq: Every day | ORAL | Status: DC

## 2024-04-30 MED ORDER — ONDANSETRON HCL 4 MG PO TABS: 4.0000 mg | ORAL_TABLET | Freq: Four times a day (QID) | ORAL | Status: DC | PRN

## 2024-04-30 MED ORDER — HEPARIN BOLUS VIA INFUSION
6000.0000 [IU] | Freq: Once | INTRAVENOUS | Status: AC
  Administered 2024-04-30: 19:00:00 6000 [IU] via INTRAVENOUS
  Filled 2024-04-30: qty 6000

## 2024-04-30 MED ORDER — LIDOCAINE 5 % EX PTCH
1.0000 | MEDICATED_PATCH | CUTANEOUS | Status: AC
  Administered 2024-04-30 – 2024-05-01 (×2): 1 via TRANSDERMAL
  Filled 2024-04-30 (×2): qty 1

## 2024-04-30 MED ORDER — HEPARIN (PORCINE) 25000 UT/250ML-% IV SOLN
2000.0000 [IU]/h | INTRAVENOUS | Status: DC
  Administered 2024-04-30: 19:00:00 1600 [IU]/h via INTRAVENOUS
  Filled 2024-04-30 (×2): qty 250

## 2024-04-30 MED ORDER — IOHEXOL 350 MG/ML SOLN
75.0000 mL | Freq: Once | INTRAVENOUS | Status: AC | PRN
  Administered 2024-04-30: 18:00:00 75 mL via INTRAVENOUS

## 2024-04-30 MED ORDER — MORPHINE SULFATE (PF) 4 MG/ML IV SOLN
4.0000 mg | Freq: Once | INTRAVENOUS | Status: AC
  Administered 2024-04-30: 17:00:00 4 mg via INTRAVENOUS
  Filled 2024-04-30: qty 1

## 2024-04-30 MED ORDER — ONDANSETRON HCL 4 MG/2ML IJ SOLN
4.0000 mg | Freq: Once | INTRAMUSCULAR | Status: AC
  Administered 2024-04-30: 17:00:00 4 mg via INTRAVENOUS
  Filled 2024-04-30: qty 2

## 2024-04-30 MED ORDER — IPRATROPIUM-ALBUTEROL 0.5-2.5 (3) MG/3ML IN SOLN
3.0000 mL | Freq: Once | RESPIRATORY_TRACT | Status: AC
  Administered 2024-04-30: 19:00:00 3 mL via RESPIRATORY_TRACT
  Filled 2024-04-30: qty 3

## 2024-04-30 MED ORDER — ACETAMINOPHEN 325 MG PO TABS: 650.0000 mg | ORAL_TABLET | Freq: Four times a day (QID) | ORAL | Status: DC | PRN

## 2024-04-30 NOTE — ED Triage Notes (Signed)
 Pt to ED [facility] from home for shob for a few weeks. Was started on antibiotics and steroids at [facility]. Received albuterol , duoneb PTA. 98% on RA with EMS. Reports pain to right ribcage with inspiration. Also reports bilateral arm pain.

## 2024-04-30 NOTE — ED Provider Notes (Signed)
 [facility] Provider Note    Event Date/Time   First MD Initiated Contact with Patient 04/30/24 1626     (approximate)  History   Chief Complaint: Shortness of Breath  HPI  [name] is a 53 y.o. female with a past medical history of anxiety, arthritis, depression, gastric reflux, hypertension, presents to the emergency department for chest pain continued cough and shortness of breath.  According to the patient for the past 2 weeks now she has been experiencing shortness of breath.  Patient states she has completed a course of antibiotics she is currently on a course of steroids prescribed by an urgent care.  Patient has now developed the last couple days significant right sided chest pain worse with inspiration or cough.  Patient denies any known recent fever.  Patient states she has had negative COVID and flu test.  Physical Exam   Triage Vital Signs: ED Triage Vitals [04/30/24 1508]  Encounter Vitals Group     BP 118/70     Girls Systolic BP Percentile      Girls Diastolic BP Percentile      Boys Systolic BP Percentile      Boys Diastolic BP Percentile      Pulse Rate (!) 127     Resp (!) 25     Temp 98 F (36.7 C)     Temp src      SpO2 95 %     Weight (!) 326 lb 4.5 oz (148 kg)     Height 5' 10 (1.778 m)     Head Circumference      Peak Flow      Pain Score 8     Pain Loc      Pain Education      Exclude from Growth Chart     Most recent vital signs: Vitals:   04/30/24 1508  BP: 118/70  Pulse: (!) 127  Resp: (!) 25  Temp: 98 F (36.7 C)  SpO2: 95%    General: Awake, no distress.  CV:  Good peripheral perfusion.  Regular rhythm rate around 120 bpm. Resp:  Normal effort.  Equal breath sounds bilaterally.  Occasional cough during examination very slight expiratory wheeze. Abd:  No distention.  Soft, nontender.  No rebound or guarding.  ED Results / Procedures / Treatments   EKG  EKG viewed and interpreted by myself shows  sinus tachycardia at 123 bpm with a narrow QRS, normal axis, normal intervals, nonspecific ST changes.  No ST elevation.  RADIOLOGY  I have reviewed and interpreted the chest x-ray images.  No obvious consolidation seen on my evaluation. Radiology is read the x-ray is negative  CTA positive for right sided segmental PE.  MEDICATIONS ORDERED IN ED: Medications  iohexol  (OMNIPAQUE ) 350 MG/ML injection 75 mL (has no administration in time range)  morphine  (PF) 4 MG/ML injection 4 mg (4 mg Intravenous Given 04/30/24 1724)  ondansetron  (ZOFRAN ) injection 4 mg (4 mg Intravenous Given 04/30/24 1724)  sodium chloride  0.9 % bolus 1,000 mL (1,000 mLs Intravenous New Bag/Given 04/30/24 1725)     IMPRESSION / MDM / ASSESSMENT AND PLAN / ED COURSE  I reviewed the triage vital signs and the nursing notes.  Patient's presentation is most consistent with acute presentation with potential threat to life or bodily function.  Patient presents to the emergency department for worsening cough chest pain shortness of breath.  Patient describes the pain as pleuritic and right sided.  Patient is  tachycardic to 127 bpm with mild tachypnea around 25 breaths/min.  Patient's workup today shows mild leukocytosis otherwise reassuring CBC patient is currently on prednisone  which could account for the leukocytosis.  Patient's chemistry shows no significant findings.  Troponin reassuringly negative, lactic acid reassuringly normal.  Chest x-ray is clear.  Given the patient's pleuritic nature to her chest pain tachycardia and tachypnea we will treat pain we will IV hydrate and obtain a CTA of the chest to help rule out pulmonary embolism.  Patient agreeable to plan of care and workup.  CTA positive for right sided segmental PE.  Will start the patient on heparin  and admit to the hospital service for further workup and treatment.  Patient agreeable to plan of care.  CRITICAL CARE Performed by: Franky Moores   Total  critical care time: 30 minutes  Critical care time was exclusive of separately billable procedures and treating other patients.  Critical care was necessary to treat or prevent imminent or life-threatening deterioration.  Critical care was time spent personally by me on the following activities: development of treatment plan with patient and/or surrogate as well as nursing, discussions with consultants, evaluation of patient's response to treatment, examination of patient, obtaining history from patient or surrogate, ordering and performing treatments and interventions, ordering and review of laboratory studies, ordering and review of radiographic studies, pulse oximetry and re-evaluation of patient's condition.   FINAL CLINICAL IMPRESSION(S) / ED DIAGNOSES   Chest pain  Note:  This document was prepared using Dragon voice recognition software and may include unintentional dictation errors.   Moores Franky, MD 04/30/24 [phone]

## 2024-04-30 NOTE — Consult Note (Signed)
 PHARMACY - ANTICOAGULATION CONSULT NOTE  Pharmacy Consult for heparin  infusion Indication: pulmonary embolus  Allergies[1]  Patient Measurements: Height: 5' 10 (177.8 cm) Weight: (!) 148 kg (326 lb 4.5 oz) IBW/kg (Calculated) : 68.5 HEPARIN  DW (KG): 104.3  Vital Signs: Temp: 98 F (36.7 C) (12/18 1508) BP: 118/70 (12/18 1508) Pulse Rate: 127 (12/18 1508)  Labs: Recent Labs    04/30/24 1511  HGB 16.2*  HCT 48.9*  PLT 374  CREATININE 0.99    Estimated Creatinine Clearance: 104.1 mL/min (by C-G formula based on SCr of 0.99 mg/dL).   Medical History: Past Medical History:  Diagnosis Date   Anxiety    Arthritis    Cholecystitis with cholelithiasis 02/25/2013   Complication of anesthesia    woke up crying after tubes tied, anxiety attack   Depression    Depression 04/12/2017   GERD (gastroesophageal reflux disease)    Hypertension    Migraine    no longer have these   MRSA carrier     Medications:  No home anticoagulants per pharmacist review  Assessment: 53 yo female presented to the ED with complaint of shortness of breath.  CTA was positive for right sided PE.  Pharmacy consulted to initiate heparin  infusion.  Baseline aPTT and PT/INR ordered  Goal of Therapy:  Heparin  level 0.3-0.7 units/ml Monitor platelets by anticoagulation protocol: Yes   Plan:  Give 6000 units bolus x 1 Start heparin  infusion at 1600 units/hr Check anti-Xa level in 6 hours and daily while on heparin  Continue to monitor H&H and platelets  Kayla JULIANNA Blew, PharmD 04/30/2024,6:52 PM      [1] No Known Allergies

## 2024-04-30 NOTE — H&P (Signed)
 History and Physical    Patient: [name] FMW:[id number] DOB: 1970-07-26 DOA: 04/30/2024 DOS: the patient was seen and examined on 04/30/2024 PCP: Vincente Shivers, NP  Patient coming from: Home  Chief Complaint:  Chief Complaint  Patient presents with   Shortness of Breath   HPI: [name] is a 53 y.o. female with medical history significant of hypertension, COPD, type 2 diabetes mellitus, arthritis, tobacco abuse who presents to the emergency department due to 2-week onset of shortness of breath.  She went to an urgent care on Saturday (12/13) and she was given empiric antibiotics which she completed, but still on a course of steroids prescribed.  She complained of 2-day onset of right-sided chest pain which worsens with inspiration/cough.  She denies any recent travels, fever, sick contacts.  ED course In the emergency department, she was tachypneic and tachycardic.  WBC 19.6, hemoglobin 16.2, hematocrit 48.9.  BMP was normal except for blood glucose of 150.  Lactic acid and troponin were normal. CTA chest was positive for segmental right middle lobe pulmonary emboli She was started on IV heparin  drip, breathing treatment was provided, morphine  was given due to right-sided pleuritic pain and IV hydration was provided.    Review of Systems: As mentioned in the history of present illness. All other systems reviewed and are negative. Past Medical History:  Diagnosis Date   Anxiety    Arthritis    Cholecystitis with cholelithiasis 02/25/2013   Complication of anesthesia    woke up crying after tubes tied, anxiety attack   Depression    Depression 04/12/2017   GERD (gastroesophageal reflux disease)    Hypertension    Migraine    no longer have these   MRSA carrier    Past Surgical History:  Procedure Laterality Date   CHOLECYSTECTOMY     RADIOLOGY WITH ANESTHESIA Bilateral 08/08/2021   Procedure: MRI WITH BILATERAL HIPWITHOUT CONTRAST,RIGHT  KNEE WITHOUT CONTRAST;   Surgeon: Radiologist, Medication, MD;  Location: [facility] OR;  Service: Radiology;  Laterality: Bilateral;   TONSILLECTOMY     TUBAL LIGATION     Social History:  reports that she has been smoking cigarettes. She started smoking about 32 years ago. She has a 33 pack-year smoking history. She has never used smokeless tobacco. She reports that she does not currently use alcohol. She reports current drug use.  Allergies[1]  History reviewed. No pertinent family history.  Prior to Admission medications  Medication Sig Start Date End Date Taking? Authorizing Provider  Accu-Chek Softclix Lancets lancets 1 each 3 (three) times daily. 08/20/23   [provider]  albuterol  (PROVENTIL ) (2.5 MG/3ML) 0.083% nebulizer solution Take 3 mLs (2.5 mg total) by nebulization every 6 (six) hours as needed for wheezing or shortness of breath. 04/07/24   Gladis Elsie BROCKS, PA-C  benzonatate  (TESSALON ) 100 MG capsule Take 1 capsule (100 mg total) by mouth 3 (three) times daily as needed for cough. 04/25/24   Corlis Burnard DEL, NP  Blood Glucose Monitoring Suppl DEVI 1 each by Does not apply route in the morning, at noon, and at bedtime. May substitute to any manufacturer covered by patient's insurance. 08/20/23   Vincente Shivers, NP  cyanocobalamin (VITAMIN B12) 1000 MCG tablet Take 1 tablet (1,000 mcg total) by mouth daily. 04/28/24   Vincente Shivers, NP  doxycycline  (VIBRAMYCIN ) 100 MG capsule Take 1 capsule (100 mg total) by mouth 2 (two) times daily for 7 days. 04/25/24 05/02/24  Corlis Burnard DEL, NP  furosemide  (LASIX ) 20  MG tablet Take 1 tablet (20 mg total) by mouth daily. 04/27/24 04/27/25  Vincente Shivers, NP  gabapentin  (NEURONTIN ) 100 MG capsule Take 1 capsule (100 mg total) by mouth 2 (two) times daily. 04/28/24   Vincente Shivers, NP  metFORMIN  (GLUCOPHAGE -XR) 500 MG 24 hr tablet Take 1 tablet (500 mg total) by mouth daily with breakfast. 04/27/24   Vincente Shivers, NP  predniSONE  (DELTASONE ) 10 MG tablet Take 4 tablets (40  mg total) by mouth daily for 5 days. 04/25/24 04/30/24  Corlis Burnard DEL, NP  tirzepatide  (MOUNJARO ) 5 MG/0.5ML Pen Inject 5 mg into the skin once a week. 04/27/24   Vincente Shivers, NP  traZODone  (DESYREL ) 100 MG tablet Take 1 tablet (100 mg total) by mouth at bedtime. 04/27/24   Vincente Shivers, NP  valsartan -hydrochlorothiazide  (DIOVAN -HCT) 80-12.5 MG tablet Take 1 tablet by mouth daily. 04/25/24   Corlis Burnard DEL, NP  Vitamin D , Ergocalciferol , (DRISDOL ) 1.25 MG (50000 UNIT) CAPS capsule Take 1 capsule (50,000 Units total) by mouth every 7 (seven) days. 04/28/24   Vincente Shivers, NP    Physical Exam: Vitals:   04/30/24 1508  BP: 118/70  Pulse: (!) 127  Resp: (!) 25  Temp: 98 F (36.7 C)  SpO2: 95%  Weight: (!) 148 kg  Height: 5' 10 (1.778 m)   General: Awake and alert and oriented x3. Not in any acute distress.  HEENT: NCAT.  PERRLA. EOMI. Sclerae anicteric.  Moist mucosal membranes. Neck: Neck supple without lymphadenopathy. No carotid bruits. No masses palpated.  Cardiovascular: Regular rate with normal S1-S2 sounds. No murmurs, rubs or gallops auscultated. No JVD.  Respiratory: Diffuse mild rhonchi and expiratory wheezes. No accessory muscle use. Abdomen: Soft, nontender, nondistended. Active bowel sounds. No masses or hepatosplenomegaly  Skin: No rashes, lesions, or ulcerations.  Dry, warm to touch. Musculoskeletal: Right knee joint pain with pain on flexion and extension.  2+ dorsalis pedis and radial pulses.  No contractures  Psychiatric: Intact judgment and insight.  Mood appropriate to current condition. Neurologic: No focal neurological deficits. Strength is 5/5 x 4.  CN II - XII grossly intact.  Assessment and Plan: Acute pulmonary embolism Patient presented with shortness of breath and right-sided pleuritic pain CT angiography chest with contrast showed segmental right middle lobe pulmonary emboli Patient was started on IV heparin  drip with plan to transition to DOAC in the  morning Bilateral lower extremity ultrasound will be done in the morning Echocardiogram will be done in the morning  Essential hypertension (controlled) Continue Diovan -HCTZ  COPD (not in acute exacerbation) Continue albuterol  via nebulizer as needed  Type 2 diabetes mellitus Hemoglobin A1c on 04/27/2024 was 6.2 Continue ISS and hypoglycemia protocol Metformin  will be held at this time  Obesity class III (BMI 46.82) Patient was counseled about the cardiovascular and metabolic risk of morbid obesity. Patient was counseled for diet control, exercise regimen and weight loss.    Advance Care Planning: Full code  Consults: None  Family Communication: None at bedside  Severity of Illness: The appropriate patient status for this patient is OBSERVATION. Observation status is judged to be reasonable and necessary in order to provide the required intensity of service to ensure the patient's safety. The patient's presenting symptoms, physical exam findings, and initial radiographic and laboratory data in the context of their medical condition is felt to place them at decreased risk for further clinical deterioration. Furthermore, it is anticipated that the patient will be medically stable for discharge from the hospital within 2 midnights of  admission.   Author: Taffany Heiser, DO 04/30/2024 7:29 PM  For on call review [email or web address].      [1] No Known Allergies

## 2024-05-01 ENCOUNTER — Telehealth (HOSPITAL_COMMUNITY): Payer: Self-pay

## 2024-05-01 ENCOUNTER — Observation Stay: Payer: MEDICAID

## 2024-05-01 DIAGNOSIS — I82442 Acute embolism and thrombosis of left tibial vein: Secondary | ICD-10-CM | POA: Diagnosis present

## 2024-05-01 DIAGNOSIS — Z6841 Body Mass Index (BMI) 40.0 and over, adult: Secondary | ICD-10-CM | POA: Diagnosis not present

## 2024-05-01 DIAGNOSIS — Z7901 Long term (current) use of anticoagulants: Secondary | ICD-10-CM | POA: Diagnosis not present

## 2024-05-01 DIAGNOSIS — Z7984 Long term (current) use of oral hypoglycemic drugs: Secondary | ICD-10-CM | POA: Diagnosis not present

## 2024-05-01 DIAGNOSIS — E1122 Type 2 diabetes mellitus with diabetic chronic kidney disease: Secondary | ICD-10-CM | POA: Diagnosis present

## 2024-05-01 DIAGNOSIS — K219 Gastro-esophageal reflux disease without esophagitis: Secondary | ICD-10-CM | POA: Diagnosis present

## 2024-05-01 DIAGNOSIS — F419 Anxiety disorder, unspecified: Secondary | ICD-10-CM | POA: Diagnosis present

## 2024-05-01 DIAGNOSIS — F17210 Nicotine dependence, cigarettes, uncomplicated: Secondary | ICD-10-CM | POA: Diagnosis present

## 2024-05-01 DIAGNOSIS — J449 Chronic obstructive pulmonary disease, unspecified: Secondary | ICD-10-CM | POA: Diagnosis present

## 2024-05-01 DIAGNOSIS — I129 Hypertensive chronic kidney disease with stage 1 through stage 4 chronic kidney disease, or unspecified chronic kidney disease: Secondary | ICD-10-CM | POA: Diagnosis present

## 2024-05-01 DIAGNOSIS — R0602 Shortness of breath: Secondary | ICD-10-CM | POA: Diagnosis present

## 2024-05-01 DIAGNOSIS — E66813 Obesity, class 3: Secondary | ICD-10-CM | POA: Diagnosis present

## 2024-05-01 DIAGNOSIS — N181 Chronic kidney disease, stage 1: Secondary | ICD-10-CM | POA: Diagnosis present

## 2024-05-01 DIAGNOSIS — Z79899 Other long term (current) drug therapy: Secondary | ICD-10-CM | POA: Diagnosis not present

## 2024-05-01 DIAGNOSIS — I2699 Other pulmonary embolism without acute cor pulmonale: Secondary | ICD-10-CM | POA: Diagnosis present

## 2024-05-01 DIAGNOSIS — Z7985 Long-term (current) use of injectable non-insulin antidiabetic drugs: Secondary | ICD-10-CM | POA: Diagnosis not present

## 2024-05-01 DIAGNOSIS — I2609 Other pulmonary embolism with acute cor pulmonale: Secondary | ICD-10-CM | POA: Diagnosis not present

## 2024-05-01 DIAGNOSIS — F32A Depression, unspecified: Secondary | ICD-10-CM | POA: Diagnosis present

## 2024-05-01 DIAGNOSIS — M1711 Unilateral primary osteoarthritis, right knee: Secondary | ICD-10-CM | POA: Diagnosis present

## 2024-05-01 LAB — CBC
HCT: 46 % (ref 36.0–46.0)
Hemoglobin: 14.7 g/dL (ref 12.0–15.0)
MCH: 29 pg (ref 26.0–34.0)
MCHC: 32 g/dL (ref 30.0–36.0)
MCV: 90.7 fL (ref 80.0–100.0)
Platelets: 350 K/uL (ref 150–400)
RBC: 5.07 MIL/uL (ref 3.87–5.11)
RDW: 14.5 % (ref 11.5–15.5)
WBC: 13.3 K/uL — ABNORMAL HIGH (ref 4.0–10.5)
nRBC: 0 % (ref 0.0–0.2)

## 2024-05-01 LAB — HEPARIN LEVEL (UNFRACTIONATED)
Heparin Unfractionated: 0.15 [IU]/mL — ABNORMAL LOW (ref 0.30–0.70)
Heparin Unfractionated: 0.32 [IU]/mL (ref 0.30–0.70)

## 2024-05-01 LAB — COMPREHENSIVE METABOLIC PANEL WITH GFR
ALT: 41 U/L (ref 0–44)
AST: 39 U/L (ref 15–41)
Albumin: 3.7 g/dL (ref 3.5–5.0)
Alkaline Phosphatase: 88 U/L (ref 38–126)
Anion gap: 11 (ref 5–15)
BUN: 20 mg/dL (ref 6–20)
CO2: 24 mmol/L (ref 22–32)
Calcium: 9 mg/dL (ref 8.9–10.3)
Chloride: 99 mmol/L (ref 98–111)
Creatinine, Ser: 0.84 mg/dL (ref 0.44–1.00)
GFR, Estimated: >60 mL/min
Glucose, Bld: 140 mg/dL — ABNORMAL HIGH (ref 70–99)
Potassium: 4.1 mmol/L (ref 3.5–5.1)
Sodium: 134 mmol/L — ABNORMAL LOW (ref 135–145)
Total Bilirubin: 0.5 mg/dL (ref 0.0–1.2)
Total Protein: 6.9 g/dL (ref 6.5–8.1)

## 2024-05-01 LAB — GLUCOSE, CAPILLARY
Glucose-Capillary: 157 mg/dL — ABNORMAL HIGH (ref 70–99)
Glucose-Capillary: 145 mg/dL — ABNORMAL HIGH (ref 70–99)
Glucose-Capillary: 149 mg/dL — ABNORMAL HIGH (ref 70–99)
Glucose-Capillary: 170 mg/dL — ABNORMAL HIGH (ref 70–99)

## 2024-05-01 LAB — MAGNESIUM: Magnesium: 2.2 mg/dL (ref 1.7–2.4)

## 2024-05-01 LAB — PHOSPHORUS: Phosphorus: 4.4 mg/dL (ref 2.5–4.6)

## 2024-05-01 MED ORDER — HEPARIN BOLUS VIA INFUSION
3100.0000 [IU] | Freq: Once | INTRAVENOUS | Status: AC
  Administered 2024-05-01: 3100 [IU] via INTRAVENOUS
  Filled 2024-05-01: qty 3100

## 2024-05-01 MED ORDER — APIXABAN 5 MG PO TABS: 5.0000 mg | ORAL_TABLET | Freq: Two times a day (BID) | ORAL | Status: DC

## 2024-05-01 MED ORDER — OXYCODONE-ACETAMINOPHEN 5-325 MG PO TABS
1.0000 | ORAL_TABLET | Freq: Four times a day (QID) | ORAL | Status: DC | PRN
  Administered 2024-05-01 – 2024-05-03 (×5): 1 via ORAL
  Filled 2024-05-01 (×5): qty 1

## 2024-05-01 MED ORDER — HYDROCHLOROTHIAZIDE 12.5 MG PO TABS
12.5000 mg | ORAL_TABLET | Freq: Every day | ORAL | Status: DC
  Administered 2024-05-01 – 2024-05-03 (×3): 12.5 mg via ORAL
  Filled 2024-05-01 (×3): qty 1

## 2024-05-01 MED ORDER — IRBESARTAN 150 MG PO TABS
75.0000 mg | ORAL_TABLET | Freq: Every day | ORAL | Status: DC
  Administered 2024-05-01 – 2024-05-03 (×3): 75 mg via ORAL
  Filled 2024-05-01 (×3): qty 1

## 2024-05-01 MED ORDER — APIXABAN 5 MG PO TABS
10.0000 mg | ORAL_TABLET | Freq: Two times a day (BID) | ORAL | Status: DC
  Administered 2024-05-01 – 2024-05-03 (×5): 10 mg via ORAL
  Filled 2024-05-01 (×5): qty 2

## 2024-05-01 NOTE — Consult Note (Signed)
 PHARMACY - ANTICOAGULATION CONSULT NOTE  Pharmacy Consult for heparin  infusion Indication: pulmonary embolus  Allergies[1]  Patient Measurements: Height: 5' 10 (177.8 cm) Weight: (!) 148 kg (326 lb 4.5 oz) IBW/kg (Calculated) : 68.5 HEPARIN  DW (KG): 104.3  Vital Signs: Temp: 98.8 F (37.1 C) (12/19 1015) Temp Source: Oral (12/19 0631) BP: 131/85 (12/19 1015) Pulse Rate: 108 (12/19 1015)  Labs: Recent Labs    04/30/24 1511 04/30/24 1907 05/01/24 0414 05/01/24 1254  HGB 16.2*  --  14.7  --   HCT 48.9*  --  46.0  --   PLT 374  --  350  --   APTT  --  29  --   --   LABPROT  --  14.2  --   --   INR  --  1.0  --   --   HEPARINUNFRC  --   --  0.15* 0.32  CREATININE 0.99  --  0.84  --     Estimated Creatinine Clearance: 122.6 mL/min (by C-G formula based on SCr of 0.84 mg/dL).   Medical History: Past Medical History:  Diagnosis Date   Anxiety    Arthritis    Cholecystitis with cholelithiasis 02/25/2013   Complication of anesthesia    woke up crying after tubes tied, anxiety attack   Depression    Depression 04/12/2017   GERD (gastroesophageal reflux disease)    Hypertension    Migraine    no longer have these   MRSA carrier     Medications:  No home anticoagulants per pharmacist review  Assessment: 53 yo female presented to the ED with complaint of shortness of breath.  CTA was positive for right sided PE.  Pharmacy consulted to initiate heparin  infusion.  Date Time HL Rate/Comment 12/19 1254 0.32 Therapeutic x1  Goal of Therapy:  Heparin  level 0.3-0.7 units/ml Monitor platelets by anticoagulation protocol: Yes   Plan:  Heparin  level is therapeutic x1 but at low end of goal range Will slightly increase heparin  infusion rate to 2000 units/hr to keep patient in goal range and check a confirmatory heparin  level in 6 hours Continue to monitor daily heparin  levels and CBC while on heparin  infusion Continue to monitor signs/symptoms of bleeding  Thank  you for involving pharmacy in this patient's care.   Damien Napoleon, PharmD Clinical Pharmacist 05/01/2024 2:09 PM    [1] No Known Allergies

## 2024-05-01 NOTE — Plan of Care (Signed)

## 2024-05-01 NOTE — Plan of Care (Signed)
   Problem: Education: Goal: Knowledge of General Education information will improve Description Including pain rating scale, medication(s)/side effects and non-pharmacologic comfort measures Outcome: Progressing

## 2024-05-01 NOTE — Telephone Encounter (Signed)
 Pharmacy Patient Advocate Encounter  Insurance verification completed.    The patient is insured through [employer].     Ran test claim for Eliquis Starter Pack and the current 30 day co-pay is $4.  Ran test claim for Xarelto Starter Pack and the current 30 day co-pay is $4.  This test claim was processed through [employer]- copay amounts may vary at other pharmacies due to [employer], or as the patient moves through the different stages of their insurance plan.

## 2024-05-01 NOTE — Consult Note (Signed)
 PHARMACY - ANTICOAGULATION CONSULT NOTE  Pharmacy Consult for heparin  infusion Indication: pulmonary embolus  Allergies[1]  Patient Measurements: Height: 5' 10 (177.8 cm) Weight: (!) 148 kg (326 lb 4.5 oz) IBW/kg (Calculated) : 68.5 HEPARIN  DW (KG): 104.3  Vital Signs: Temp: 98.2 F (36.8 C) (12/19 0234) Temp Source: Oral (12/19 0234) BP: 122/73 (12/19 0237) Pulse Rate: 109 (12/19 0237)  Labs: Recent Labs    04/30/24 1511 04/30/24 1907 05/01/24 0414  HGB 16.2*  --  14.7  HCT 48.9*  --  46.0  PLT 374  --  350  APTT  --  29  --   LABPROT  --  14.2  --   INR  --  1.0  --   HEPARINUNFRC  --   --  0.15*  CREATININE 0.99  --   --     Estimated Creatinine Clearance: 104.1 mL/min (by C-G formula based on SCr of 0.99 mg/dL).   Medical History: Past Medical History:  Diagnosis Date   Anxiety    Arthritis    Cholecystitis with cholelithiasis 02/25/2013   Complication of anesthesia    woke up crying after tubes tied, anxiety attack   Depression    Depression 04/12/2017   GERD (gastroesophageal reflux disease)    Hypertension    Migraine    no longer have these   MRSA carrier     Medications:  No home anticoagulants per pharmacist review  Assessment: 53 yo female presented to the ED with complaint of shortness of breath.  CTA was positive for right sided PE.  Pharmacy consulted to initiate heparin  infusion.  Baseline aPTT and PT/INR ordered  Goal of Therapy:  Heparin  level 0.3-0.7 units/ml Monitor platelets by anticoagulation protocol: Yes   Plan:  12/19: HL @ 0414 = 0.15, SUBtherapeutic  - Will order heparin  3100 units IV X 1 bolus and increase drip rate to 1950 units/hr - will recheck HL 6 hrs after rate change  - CBC daily   Anasofia Micallef D, PharmD 05/01/2024,5:29 AM       [1] No Known Allergies

## 2024-05-01 NOTE — Progress Notes (Signed)
 " PROGRESS NOTE    [name]  FMW:[id number] DOB: [birth year] DOA: 04/30/2024 PCP: Vincente Shivers, NP    Assessment & Plan:   Principal Problem:   Pulmonary embolism (HCC)  Assessment and Plan: Acute pulmonary embolism: as per CTA. Us  of b/l LE is pending. Echo ordered. D/c IV heparin  drip and start eliquis. Sedentary lifestyle    HTN: continue on home dose of irbesartan, hydrochlorothiazide .  COPD: w/o exacerbation. Bronchodilators prn and encourage incentive spirometry    DM2: well controlled, HbA1c 6.2. Continue on SSI w/ accuchecks    Morbid obesity: BMI 46.8. Complicates overall care & prognosis.      DVT prophylaxis: IV heparin  drip  Code Status: full  Family Communication:  Disposition Plan: likely d/c back home   Level of care: Telemetry  Status is: Inpatient Remains inpatient appropriate because: severity of illness    Consultants:    Procedures:   Antimicrobials:   Subjective: Pt c/o shortness of breath and wheezing   Objective: Vitals:   04/30/24 2302 05/01/24 0234 05/01/24 0237 05/01/24 0631  BP: 125/78 (!) 123/105 122/73 137/74  Pulse: (!) 117 (!) 101 (!) 109 100  Resp: 20 19  19   Temp: 98.1 F (36.7 C) 98.2 F (36.8 C)  98.4 F (36.9 C)  TempSrc: Oral Oral  Oral  SpO2: 96% 94% 96% 94%  Weight:      Height:        Intake/Output Summary (Last 24 hours) at 05/01/2024 0957 Last data filed at 05/01/2024 0400 Gross per 24 hour  Intake 1180 ml  Output --  Net 1180 ml   Filed Weights   04/30/24 1508 04/30/24 2031  Weight: (!) 148 kg (!) 148 kg    Examination:  General exam: Appears calm and comfortable  Respiratory system: decreased breath sounds b/l  Cardiovascular system: S1 & S2+. No rubs, gallops or clicks. Gastrointestinal system: Abdomen is obese, soft and nontender. Hypoactive bowel sounds heard. Central nervous system: Alert and oriented.Moves all extremities Psychiatry: Judgement and insight appear normal. Mood &  affect appropriate.     Data Reviewed: I have personally reviewed following labs and imaging studies  CBC: Recent Labs  Lab 04/27/24 1435 04/30/24 1511 05/01/24 0414  WBC 18.0 Repeated and verified X2.* 19.6* 13.3*  HGB 15.5* 16.2* 14.7  HCT 46.8* 48.9* 46.0  MCV 88.8 89.7 90.7  PLT 391.0 374 350   Basic Metabolic Panel: Recent Labs  Lab 04/27/24 1435 04/30/24 1511 05/01/24 0414  NA 135 135 134*  K 4.5 4.1 4.1  CL 97 100 99  CO2 27 24 24   GLUCOSE 200* 150* 140*  BUN 15 19 20   CREATININE 0.89 0.99 0.84  CALCIUM 10.2 9.3 9.0  MG  --   --  2.2  PHOS  --   --  4.4   GFR: Estimated Creatinine Clearance: 122.6 mL/min (by C-G formula based on SCr of 0.84 mg/dL). Liver Function Tests: Recent Labs  Lab 04/27/24 1435 05/01/24 0414  AST 11 39  ALT 15 41  ALKPHOS 86 88  BILITOT 0.4 0.5  PROT 7.7 6.9  ALBUMIN 4.3 3.7   No results for input(s): LIPASE, AMYLASE in the last 168 hours. No results for input(s): AMMONIA in the last 168 hours. Coagulation Profile: Recent Labs  Lab 04/30/24 1907  INR 1.0   Cardiac Enzymes: No results for input(s): CKTOTAL, CKMB, CKMBINDEX, TROPONINI in the last 168 hours. BNP (last 3 results) No results for input(s): PROBNP in the last 8760 hours.  HbA1C: No results for input(s): HGBA1C in the last 72 hours. CBG: Recent Labs  Lab 04/30/24 2142 05/01/24 0918  GLUCAP 193* 157*   Lipid Profile: No results for input(s): CHOL, HDL, LDLCALC, TRIG, CHOLHDL, LDLDIRECT in the last 72 hours. Thyroid  Function Tests: No results for input(s): TSH, T4TOTAL, FREET4, T3FREE, THYROIDAB in the last 72 hours. Anemia Panel: No results for input(s): VITAMINB12, FOLATE, FERRITIN, TIBC, IRON, RETICCTPCT in the last 72 hours. Sepsis Labs: Recent Labs  Lab 04/30/24 1511  LATICACIDVEN 1.3    No results found for this or any previous visit (from the past 240 hours).       Radiology  Studies: CT Angio Chest PE W and/or Wo Contrast Result Date: 04/30/2024 EXAM: CTA CHEST 04/30/2024 06:22:04 PM TECHNIQUE: CTA of the chest was performed without and with the administration of 75 mL of iohexol  (OMNIPAQUE ) 350 MG/ML injection. Multiplanar reformatted images are provided for review. MIP images are provided for review. Automated exposure control, iterative reconstruction, and/or weight based adjustment of the mA/kV was utilized to reduce the radiation dose to as low as reasonably achievable. COMPARISON: None available. CLINICAL HISTORY: Pulmonary embolism (PE) suspected, high prob. FINDINGS: PULMONARY ARTERIES: Pulmonary arteries are adequately opacified for evaluation. There are segmental right middle lobe pulmonary emboli. Main pulmonary artery is normal in caliber. MEDIASTINUM: Heart is mildly enlarged. There is diffuse wall thickening of the esophagus. This is more significant in the distal esophagus. There is no acute abnormality of the thoracic aorta. LYMPH NODES: No mediastinal, hilar or axillary lymphadenopathy. LUNGS AND PLEURA: There are patchy ground glass opacities minimally in both lung bases. There is central peribronchial wall thickening bilaterally. No focal consolidation or pulmonary edema. No evidence of pleural effusion or pneumothorax. UPPER ABDOMEN: Limited images of the upper abdomen are unremarkable. SOFT TISSUES AND BONES: Degenerative changes affect the spine. No acute soft tissue abnormality. IMPRESSION: 1. Segmental right middle lobe pulmonary emboli. 2. Mild cardiomegaly. 3. Patchy ground glass opacities minimally in both lung bases may represent atelectasis or mild edema. 4. Central peribronchial wall thickening bilaterally could be secondary to reactive airway disease or infectious/inflammatory process. . 5. Diffuse esophageal wall thickening, more significant in the distal esophagus. Follow-up endoscopy suggested to exclude underlying lesion distally. Electronically  signed by: Greig Pique MD 04/30/2024 06:37 PM EST RP Workstation: [id number]   DG Chest 2 View Result Date: 04/30/2024 CLINICAL DATA:  cp EXAM: CHEST - 2 VIEW COMPARISON:  Sep 17, 2023 FINDINGS: No focal airspace consolidation, pleural effusion, or pneumothorax. No cardiomegaly. No acute fracture or destructive lesions. Multilevel thoracic osteophytosis. IMPRESSION: No acute cardiopulmonary abnormality. Electronically Signed   By: Rogelia Myers M.D.   On: 04/30/2024 16:09        Scheduled Meds:  cyanocobalamin  1,000 mcg Oral Daily   irbesartan  75 mg Oral Daily   And   hydrochlorothiazide   12.5 mg Oral Daily   insulin  aspart  0-15 Units Subcutaneous TID WC   lidocaine   1 patch Transdermal Q24H   Continuous Infusions:  heparin  1,950 Units/hr (05/01/24 0604)     LOS: 0 days      Anthony CHRISTELLA Pouch, MD [facility] Pager [phone]  If 7PM-7AM, please contact night-coverage [email or web address] 05/01/2024, 9:57 AM   "

## 2024-05-01 NOTE — Discharge Instructions (Signed)
 Information on my medicine - ELIQUIS (apixaban)  This medication education was reviewed with me or my healthcare representative as part of my discharge preparation.  Why was Eliquis prescribed for you? Eliquis was prescribed to treat blood clots that may have been found in the veins of your legs (deep vein thrombosis) or in your lungs (pulmonary embolism) and to reduce the risk of them occurring again.  What do You need to know about Eliquis ? The starting dose is 10 mg (two 5 mg tablets) taken TWICE daily for the FIRST SEVEN (7) DAYS, then on (enter date)  05/08/24  the dose is reduced to ONE 5 mg tablet taken TWICE daily.  Eliquis may be taken with or without food.   Try to take the dose about the same time in the morning and in the evening. If you have difficulty swallowing the tablet whole please discuss with your pharmacist how to take the medication safely.  Take Eliquis exactly as prescribed and DO NOT stop taking Eliquis without talking to the doctor who prescribed the medication.  Stopping may increase your risk of developing a new blood clot.  Refill your prescription before you run out.  After discharge, you should have regular check-up appointments with your healthcare provider that is prescribing your Eliquis.    What do you do if you miss a dose? If a dose of ELIQUIS is not taken at the scheduled time, take it as soon as possible on the same day and twice-daily administration should be resumed. The dose should not be doubled to make up for a missed dose.  Important Safety Information A possible side effect of Eliquis is bleeding. You should call your healthcare provider right away if you experience any of the following: Bleeding from an injury or your nose that does not stop. Unusual colored urine (red or dark brown) or unusual colored stools (red or black). Unusual bruising for unknown reasons. A serious fall or if you hit your head (even if there is no  bleeding).  Some medicines may interact with Eliquis and might increase your risk of bleeding or clotting while on Eliquis. To help avoid this, consult your healthcare provider or pharmacist prior to using any new prescription or non-prescription medications, including herbals, vitamins, non-steroidal anti-inflammatory drugs (NSAIDs) and supplements.  This website has more information on Eliquis (apixaban): http://www.eliquis.com/eliquis/home

## 2024-05-01 NOTE — TOC CM/SW Note (Signed)
 Transition of Care [facility]) - Inpatient Brief Assessment   Patient Details  Name: [name] MRN: [id number] Date of Birth: 09/25/1970  Transition of Care [facility]) CM/SW Contact:    Corean ONEIDA Haddock, RN Phone Number: 05/01/2024, 12:37 PM   Clinical Narrative:   Transition of Care Department [facility]) has reviewed patient and no TOC needs have been identified at this time.  If new patient transition needs arise, please place a TOC consult.  Transition of Care Asessment: Insurance and Status: Insurance coverage has been reviewed Patient has primary care physician: Yes     Prior/Current Home Services: No current home services Social Drivers of Health Review: SDOH reviewed no interventions necessary Readmission risk has been reviewed: Yes Transition of care needs: no transition of care needs at this time

## 2024-05-02 ENCOUNTER — Inpatient Hospital Stay (HOSPITAL_COMMUNITY)
Admit: 2024-05-02 | Discharge: 2024-05-02 | Disposition: A | Discharge status: Home / Self Care | Payer: MEDICAID | Attending: Internal Medicine | Admitting: Internal Medicine

## 2024-05-02 DIAGNOSIS — I2609 Other pulmonary embolism with acute cor pulmonale: Secondary | ICD-10-CM | POA: Diagnosis not present

## 2024-05-02 DIAGNOSIS — I2699 Other pulmonary embolism without acute cor pulmonale: Secondary | ICD-10-CM | POA: Diagnosis not present

## 2024-05-02 LAB — CBC
HCT: 43.1 % (ref 36.0–46.0)
Hemoglobin: 14 g/dL (ref 12.0–15.0)
MCH: 29.3 pg (ref 26.0–34.0)
MCHC: 32.5 g/dL (ref 30.0–36.0)
MCV: 90.2 fL (ref 80.0–100.0)
Platelets: 335 K/uL (ref 150–400)
RBC: 4.78 MIL/uL (ref 3.87–5.11)
RDW: 14.3 % (ref 11.5–15.5)
WBC: 12.2 K/uL — ABNORMAL HIGH (ref 4.0–10.5)
nRBC: 0 % (ref 0.0–0.2)

## 2024-05-02 LAB — GLUCOSE, CAPILLARY
Glucose-Capillary: 142 mg/dL — ABNORMAL HIGH (ref 70–99)
Glucose-Capillary: 96 mg/dL (ref 70–99)
Glucose-Capillary: 106 mg/dL — ABNORMAL HIGH (ref 70–99)
Glucose-Capillary: 168 mg/dL — ABNORMAL HIGH (ref 70–99)

## 2024-05-02 LAB — ECHOCARDIOGRAM COMPLETE
Area-P 1/2: 6.6 cm2
Calc EF: 68.3 %
Height: 70 in
S' Lateral: 3.3 cm
Single Plane A2C EF: 68.2 %
Single Plane A4C EF: 69.5 %
Weight: 5220.49 [oz_av]

## 2024-05-02 MED ORDER — PERFLUTREN LIPID MICROSPHERE
1.0000 mL | INTRAVENOUS | Status: AC | PRN
  Administered 2024-05-02: 5 mL via INTRAVENOUS

## 2024-05-02 MED ORDER — GUAIFENESIN-DM 100-10 MG/5ML PO SYRP
5.0000 mL | ORAL_SOLUTION | ORAL | Status: DC | PRN
  Administered 2024-05-02 – 2024-05-03 (×4): 5 mL via ORAL
  Filled 2024-05-02 (×4): qty 10

## 2024-05-02 MED ORDER — HYDROCORTISONE 1 % EX CREA
TOPICAL_CREAM | CUTANEOUS | Status: DC | PRN
  Filled 2024-05-02: qty 28

## 2024-05-02 NOTE — Progress Notes (Signed)
 MEWS Progress Note  Patient Details Name: [name] MRN: [id number] DOB: 03/24/1971 Today's Date: 05/02/2024   MEWS Flowsheet Documentation:  Assess: MEWS Score Temp: 99.1 F (37.3 C) BP: (!) 139/94 MAP (mmHg): 107 Pulse Rate: (!) 111 ECG Heart Rate: 94 Resp: 16 Level of Consciousness: Alert SpO2: 97 % O2 Device: Room Air Assess: MEWS Score MEWS Temp: 0 MEWS Systolic: 0 MEWS Pulse: 2 MEWS RR: 0 MEWS LOC: 0 MEWS Score: 2 MEWS Score Color: Yellow Assess: SIRS CRITERIA SIRS Temperature : 0 SIRS Respirations : 0 SIRS Pulse: 1 SIRS WBC: 0 SIRS Score Sum : 1 Assess: if the MEWS score is Yellow or Red Were vital signs accurate and taken at a resting state?: Yes Does the patient meet 2 or more of the SIRS criteria?: No MEWS guidelines implemented : Yes, yellow Treat MEWS Interventions: Considered administering scheduled or prn medications/treatments as ordered Take Vital Signs Increase Vital Sign Frequency : Yellow: Q2hr x1, continue Q4hrs until patient remains green for 12hrs Escalate MEWS: Escalate: Yellow: Discuss with charge nurse and consider notifying provider and/or RRT Notify: Charge Nurse/RN Name of Charge Nurse/RN Notified: Stephane OBIE Reggie LITTIE Rolan 05/02/2024, 8:21 PM

## 2024-05-02 NOTE — Progress Notes (Signed)
 Mobility Specialist Progress Note:    05/02/24 1118  Mobility  Activity Stood at bedside;Pivoted/transferred to/from [facility]  Level of Assistance Contact guard assist, steadying assist  Assistive Device None;BSC  Activity Response Tolerated well  Mobility visit 1 Mobility  Mobility Specialist Start Time (ACUTE ONLY) 1110  Mobility Specialist Stop Time (ACUTE ONLY) 1119  Mobility Specialist Time Calculation (min) (ACUTE ONLY) 9 min   Pt received requesting assistance. Required CGA to stand and transfer with no AD. Tolerated well, c/o rib pain. Returned pt sitting EOB, all needs met.  Sherrilee Ditty Mobility Specialist Please contact via Special Educational Needs Teacher or  Rehab office at [phone]

## 2024-05-02 NOTE — Progress Notes (Signed)
" °  Echocardiogram 2D Echocardiogram has been performed. Definity  IV ultrasound used on this study.  [name] 05/02/2024, 1:28 PM "

## 2024-05-02 NOTE — Evaluation (Signed)
 Physical Therapy Evaluation Patient Details Name: [name] MRN: [id number] DOB: 1970-09-21 Today's Date: 05/02/2024  History of Present Illness  [name] is a 53 y.o. female with medical history significant of hypertension, COPD, type 2 diabetes mellitus, arthritis, tobacco abuse who presents to the emergency department due to 2-week onset of shortness of breath. CTA chest was positive for segmental right middle lobe pulmonary emboli. She was started on IV heparin  drip. Acute DVT in left posterior tibial veins.   Clinical Impression  Patient received in bed and agreeable to PT evaluation. Patient lives with her son in a single story mobile home with 3 steps to enter. At baseline she does not ambulate due to R knee dysfunction. She has been losing weight (down 20#) in order to be eligible for a knee replacement. At baseline she is mod I with pivot transfers usually, but occasionally needs help She sleeps on a love seat and gets around the house scooting in her office chair. She works from home. She is I with dressing and bathing. She currently takes sink baths because the tub faucet is broken. She needs help from her son to enter/exit the home by scooting on her buttocks. Upon PT evaluation, patient was I for bed mobility and was able to pivot transfer bed to chair mod I. She appears to be at her most recent baseline mobility but would benefit from further PT to improve her strength and functional mobility and decrease caregiver burden. Patient would benefit from skilled physical therapy to address impairments and functional limitations (see PT Problem List below) to work towards stated goals and return to PLOF or maximal functional independence.     If plan is discharge home, recommend the following: A little help with walking and/or transfers;A little help with bathing/dressing/bathroom;Help with stairs or ramp for entrance;Assistance with cooking/housework;Assist for transportation   Can  travel by private vehicle    yes    Equipment Recommendations None recommended by PT  Recommendations for Other Services       Functional Status Assessment Patient has not had a recent decline in their functional status     Precautions / Restrictions Precautions Precautions: Fall Restrictions Weight Bearing Restrictions Per Provider Order: No      Mobility  Bed Mobility Overal bed mobility: Independent             General bed mobility comments: supine <> sit I    Transfers Overall transfer level: Modified independent Equipment used: None               General transfer comment: Patient transferred bed to chair with pivot transfer after PT positioned chair close to bed.    Ambulation/Gait   Gait Distance (Feet): 0 Feet           General Gait Details: does not ambulate at baseline  Stairs            Wheelchair Mobility     Tilt Bed    Modified Rankin (Stroke Patients Only)       Balance Overall balance assessment: Needs assistance   Sitting balance-Leahy Scale: Normal     Standing balance support: During functional activity, Bilateral upper extremity supported Standing balance-Leahy Scale: Poor Standing balance comment: able to stand on L LE with B UE support stooped over (hands on chair) during pivot transfer.  Pertinent Vitals/Pain Pain Assessment Pain Assessment: 0-10 Pain Score: 8  Pain Location: right lateral thorax Pain Descriptors / Indicators: Discomfort Pain Intervention(s): Limited activity within patient's tolerance, Monitored during session, RN gave pain meds during session, Patient requesting pain meds-RN notified, Repositioned    Home Living Family/patient expects to be discharged to:: Private residence Living Arrangements: Children Available Help at Discharge: Family;Available PRN/intermittently (son works part time) Type of Home: Mobile home Home Access: Stairs to  enter Entrance Stairs-Rails: Right;Left;Can reach both Entrance Stairs-Number of Steps: 3   Home Layout: One level Home Equipment: [employer] (2 wheels);Wheelchair - manual;Tub bench;BSC/3in1 Additional Comments: does not use RW currently    Prior Function Prior Level of Function : Needs assist             Mobility Comments: Patient mod I most of the time with transfers from/to the love seat where she sleeps and her office chair that she uses to scoot around the home. She has help from her son to get in and out of the house by scooting on her buttocks. She uses a w/c when she goes out in the community (only to doctor's appointments). She needs a R knee replacement (has lost 20# to be eligible). She cannot bear weight on her R LE due to her knee. ADLs Comments: I with toileting and dressing. Needs help for IADLs and and bathing. Does not drive.     Extremity/Trunk Assessment   Upper Extremity Assessment Upper Extremity Assessment: Generalized weakness    Lower Extremity Assessment Lower Extremity Assessment: RLE deficits/detail (L side WFL for self pivot transfer) RLE Deficits / Details: unable to bear weight on R LE due to instability/pain RLE: Unable to fully assess due to pain    Cervical / Trunk Assessment Cervical / Trunk Assessment: Normal  Communication   Communication Communication: No apparent difficulties    Cognition Arousal: Alert Behavior During Therapy: WFL for tasks assessed/performed   PT - Cognitive impairments: No apparent impairments                         Following commands: Intact       Cueing Cueing Techniques: Verbal cues     General Comments General comments (skin integrity, edema, etc.): B LE edema/redness    Exercises Other Exercises Other Exercises: pt educated on role of PT in acute care setting, discharge reccomendations.   Assessment/Plan    PT Assessment Patient needs continued PT services  PT Problem List          PT Treatment Interventions DME instruction;Therapeutic exercise;Gait training;Balance training;Stair training;Neuromuscular re-education;Functional mobility training;Therapeutic activities;Patient/family education    PT Goals (Current goals can be found in the Care Plan section)  Acute Rehab PT Goals Patient Stated Goal: to go home, to get knee surgery PT Goal Formulation: With patient Time For Goal Achievement: 05/16/24 Potential to Achieve Goals: Good    Frequency Min 1X/week     Co-evaluation               AM-PAC PT 6 Clicks Mobility  Outcome Measure Help needed turning from your back to your side while in a flat bed without using bedrails?: None Help needed moving from lying on your back to sitting on the side of a flat bed without using bedrails?: None Help needed moving to and from a bed to a chair (including a wheelchair)?: None Help needed standing up from a chair using your arms (e.g., wheelchair or bedside chair)?:  A Little Help needed to walk in hospital room?: A Lot Help needed climbing 3-5 steps with a railing? : A Lot 6 Click Score: 19    End of Session   Activity Tolerance: Patient tolerated treatment well Patient left: in chair;with call bell/phone within reach;with chair alarm set Nurse Communication: Mobility status PT Visit Diagnosis: Other abnormalities of gait and mobility (R26.89);Pain;Muscle weakness (generalized) (M62.81) Pain - Right/Left: Right Pain - part of body:  (thorax)    Time: 1325-1350 PT Time Calculation (min) (ACUTE ONLY): 25 min   Charges:   PT Evaluation $PT Eval Low Complexity: 1 Low PT Treatments $Therapeutic Activity: 8-22 mins PT General Charges $$ ACUTE PT VISIT: 1 Visit         Camie R. Juli, PT, DPT, Cert. MDT, PRA-C 05/02/2024, 2:09 PM

## 2024-05-02 NOTE — Progress Notes (Signed)
 " PROGRESS NOTE   HPI was taken from Dr. Manfred: [name] is a 53 y.o. female with medical history significant of hypertension, COPD, type 2 diabetes mellitus, arthritis, tobacco abuse who presents to the emergency department due to 2-week onset of shortness of breath.  She went to an urgent care on Saturday (12/13) and she was given empiric antibiotics which she completed, but still on a course of steroids prescribed.  She complained of 2-day onset of right-sided chest pain which worsens with inspiration/cough.  She denies any recent travels, fever, sick contacts.   ED course In the emergency department, she was tachypneic and tachycardic.  WBC 19.6, hemoglobin 16.2, hematocrit 48.9.  BMP was normal except for blood glucose of 150.  Lactic acid and troponin were normal. CTA chest was positive for segmental right middle lobe pulmonary emboli She was started on IV heparin  drip, breathing treatment was provided, morphine  was given due to right-sided pleuritic pain and IV hydration was provided.   ACSA ESTEY  FMW:[id number] DOB: 08-19-1970 DOA: 04/30/2024 PCP: Vincente Shivers, NP    Assessment & Plan:   Principal Problem:   Pulmonary embolism (HCC)  Assessment and Plan: Acute pulmonary embolism: as per CTA. Continue on eliquis . IV heparin  was d/c. Sedentary lifestyle. Echo ordered but not done yet. Still w/ lateral chest wall pain.   DVT: of left posterior tibial veins. Continue on eliquis     HTN: continue on hydrochlorothiazide , irbesartan    COPD: w/o exacerbation. Bronchodilators prn and encourage incentive spirometry    DM2: well controlled, HbA1c 6.2. Continue on SSI w/ accuchecks    Morbid obesity: BMI 46.8. Complicates overall care & prognosis.      DVT prophylaxis: eliquis  Code Status: full  Family Communication:  Disposition Plan: likely d/c back home   Level of care: Telemetry  Status is: Inpatient Remains inpatient appropriate because: severity of illness,  waiting on echo to be done and read     Consultants:    Procedures:   Antimicrobials:   Subjective: Pt c/o lateral chest wall pain.  Objective: Vitals:   05/01/24 2037 05/02/24 0217 05/02/24 0812 05/02/24 0839  BP: 131/89 119/68 (!) 129/91 (!) 164/77  Pulse:  97 89 (!) 56  Resp: 18 20 20 17   Temp: 98.6 F (37 C) 98.1 F (36.7 C) 98.6 F (37 C) 97.7 F (36.5 C)  TempSrc: Oral     SpO2:  (!) 86% 100% 100%  Weight:      Height:        Intake/Output Summary (Last 24 hours) at 05/02/2024 0934 Last data filed at 05/01/2024 2100 Gross per 24 hour  Intake 1300 ml  Output 1100 ml  Net 200 ml   Filed Weights   04/30/24 1508 04/30/24 2031  Weight: (!) 148 kg (!) 148 kg    Examination:  General exam: Appears uncomfortable  Respiratory system: diminished breath sounds b/l  Cardiovascular system: S1/S2+. No rubs or clicks Gastrointestinal system: Abd is soft, NT, obese & hypoactive bowel sounds  Central nervous system: alert & oriented. Moves all extremities  Psychiatry: Judgement and insight appears at baseline. Flat mood and affect    Data Reviewed: I have personally reviewed following labs and imaging studies  CBC: Recent Labs  Lab 04/27/24 1435 04/30/24 1511 05/01/24 0414 05/02/24 0401  WBC 18.0 Repeated and verified X2.* 19.6* 13.3* 12.2*  HGB 15.5* 16.2* 14.7 14.0  HCT 46.8* 48.9* 46.0 43.1  MCV 88.8 89.7 90.7 90.2  PLT 391.0 374 350 335  Basic Metabolic Panel: Recent Labs  Lab 04/27/24 1435 04/30/24 1511 05/01/24 0414  NA 135 135 134*  K 4.5 4.1 4.1  CL 97 100 99  CO2 27 24 24   GLUCOSE 200* 150* 140*  BUN 15 19 20   CREATININE 0.89 0.99 0.84  CALCIUM 10.2 9.3 9.0  MG  --   --  2.2  PHOS  --   --  4.4   GFR: Estimated Creatinine Clearance: 122.6 mL/min (by C-G formula based on SCr of 0.84 mg/dL). Liver Function Tests: Recent Labs  Lab 04/27/24 1435 05/01/24 0414  AST 11 39  ALT 15 41  ALKPHOS 86 88  BILITOT 0.4 0.5  PROT 7.7 6.9   ALBUMIN 4.3 3.7   No results for input(s): LIPASE, AMYLASE in the last 168 hours. No results for input(s): AMMONIA in the last 168 hours. Coagulation Profile: Recent Labs  Lab 04/30/24 1907  INR 1.0   Cardiac Enzymes: No results for input(s): CKTOTAL, CKMB, CKMBINDEX, TROPONINI in the last 168 hours. BNP (last 3 results) No results for input(s): PROBNP in the last 8760 hours. HbA1C: No results for input(s): HGBA1C in the last 72 hours. CBG: Recent Labs  Lab 05/01/24 0918 05/01/24 1129 05/01/24 1640 05/01/24 2241 05/02/24 0814  GLUCAP 157* 145* 149* 170* 142*   Lipid Profile: No results for input(s): CHOL, HDL, LDLCALC, TRIG, CHOLHDL, LDLDIRECT in the last 72 hours. Thyroid  Function Tests: No results for input(s): TSH, T4TOTAL, FREET4, T3FREE, THYROIDAB in the last 72 hours. Anemia Panel: No results for input(s): VITAMINB12, FOLATE, FERRITIN, TIBC, IRON, RETICCTPCT in the last 72 hours. Sepsis Labs: Recent Labs  Lab 04/30/24 1511  LATICACIDVEN 1.3    No results found for this or any previous visit (from the past 240 hours).       Radiology Studies: US  Venous Img Lower Bilateral (DVT) Result Date: 05/01/2024 CLINICAL DATA:  Recent diagnosis of pulmonary emboli in a patient with acute onset shortness of breath EXAM: Bilateral LOWER EXTREMITY VENOUS DOPPLER ULTRASOUND TECHNIQUE: Gray-scale sonography with compression, as well as color and duplex ultrasound, were performed to evaluate the deep venous system(s) from the level of the common femoral vein through the popliteal and proximal calf veins. COMPARISON:  None Available. FINDINGS: VENOUS Normal compressibility of the bilateral common femoral, superficial femoral, and popliteal veins. Limited visualization of the right calf veins. Although there is limited visualization of the left calf veins, there is noncompressibility of the posterior tibial veins. Visualized  portions of profunda femoral vein and great saphenous vein unremarkable. No filling defects to suggest DVT on grayscale or color Doppler imaging for the right lower extremity. The left lower extremity color Doppler imaging is consistent with DVT in the posterior tibial vein on the left. Doppler waveforms show normal direction of venous flow, normal respiratory plasticity and response to augmentation with the exception of the left posterior tibial veins. Limited views of the contralateral common femoral vein are unremarkable. OTHER None. Limitations: none IMPRESSION: Acute DVT in the left posterior tibial veins. No DVT in the right lower extremity (although limited visualization in the infrapopliteal vessels). These results will be called to the ordering clinician or representative by the Radiologist Assistant, and communication documented in the PACS or [employer]. Electronically Signed   By: Cordella Banner   On: 05/01/2024 14:49   CT Angio Chest PE W and/or Wo Contrast Result Date: 04/30/2024 EXAM: CTA CHEST 04/30/2024 06:22:04 PM TECHNIQUE: CTA of the chest was performed without and with the administration of  75 mL of iohexol  (OMNIPAQUE ) 350 MG/ML injection. Multiplanar reformatted images are provided for review. MIP images are provided for review. Automated exposure control, iterative reconstruction, and/or weight based adjustment of the mA/kV was utilized to reduce the radiation dose to as low as reasonably achievable. COMPARISON: None available. CLINICAL HISTORY: Pulmonary embolism (PE) suspected, high prob. FINDINGS: PULMONARY ARTERIES: Pulmonary arteries are adequately opacified for evaluation. There are segmental right middle lobe pulmonary emboli. Main pulmonary artery is normal in caliber. MEDIASTINUM: Heart is mildly enlarged. There is diffuse wall thickening of the esophagus. This is more significant in the distal esophagus. There is no acute abnormality of the thoracic aorta. LYMPH NODES: No  mediastinal, hilar or axillary lymphadenopathy. LUNGS AND PLEURA: There are patchy ground glass opacities minimally in both lung bases. There is central peribronchial wall thickening bilaterally. No focal consolidation or pulmonary edema. No evidence of pleural effusion or pneumothorax. UPPER ABDOMEN: Limited images of the upper abdomen are unremarkable. SOFT TISSUES AND BONES: Degenerative changes affect the spine. No acute soft tissue abnormality. IMPRESSION: 1. Segmental right middle lobe pulmonary emboli. 2. Mild cardiomegaly. 3. Patchy ground glass opacities minimally in both lung bases may represent atelectasis or mild edema. 4. Central peribronchial wall thickening bilaterally could be secondary to reactive airway disease or infectious/inflammatory process. . 5. Diffuse esophageal wall thickening, more significant in the distal esophagus. Follow-up endoscopy suggested to exclude underlying lesion distally. Electronically signed by: Greig Pique MD 04/30/2024 06:37 PM EST RP Workstation: [id number]   DG Chest 2 View Result Date: 04/30/2024 CLINICAL DATA:  cp EXAM: CHEST - 2 VIEW COMPARISON:  Sep 17, 2023 FINDINGS: No focal airspace consolidation, pleural effusion, or pneumothorax. No cardiomegaly. No acute fracture or destructive lesions. Multilevel thoracic osteophytosis. IMPRESSION: No acute cardiopulmonary abnormality. Electronically Signed   By: Rogelia Myers M.D.   On: 04/30/2024 16:09        Scheduled Meds:  apixaban   10 mg Oral BID   Followed by   NOREEN ON 05/08/2024] apixaban   5 mg Oral BID   cyanocobalamin   1,000 mcg Oral Daily   irbesartan   75 mg Oral Daily   And   hydrochlorothiazide   12.5 mg Oral Daily   insulin  aspart  0-15 Units Subcutaneous TID WC   Continuous Infusions:     LOS: 1 day      Anthony CHRISTELLA Pouch, MD [facility] Pager [phone]  If 7PM-7AM, please contact night-coverage [email or web address] 05/02/2024, 9:34 AM   "

## 2024-05-03 LAB — GLUCOSE, CAPILLARY: Glucose-Capillary: 132 mg/dL — ABNORMAL HIGH (ref 70–99)

## 2024-05-03 MED ORDER — APIXABAN 5 MG PO TABS: ORAL_TABLET | ORAL | 1 refills | Status: DC

## 2024-05-03 NOTE — Discharge Summary (Signed)
 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 female with medical history significant of hypertension, COPD, type 2 diabetes mellitus, arthritis, tobacco abuse who presents to the emergency department due to 2-week onset of shortness of breath.  She went to an urgent care on Saturday (12/13) and she was given empiric antibiotics which she completed, but still on a course of steroids prescribed.  She complained of 2-day onset of right-sided chest pain which worsens with inspiration/cough.  She denies any recent travels, fever, sick contacts.   Hospital Course:   Patient presents with dyspnea. Found to have left leg dvt and right middle lobe segmental PE. No hypoxia or signs right heart strain on CT. Despite this, patient was kept in hospital so TTE could be performed. It was normal. Patient reports she has been essentially combined to a chair for the past year due to severe pain from right knee arthritis, so immobility is the likely etiology of these venous tromboses, and would advise maintaining anticoagulation at least as long as patient's mobility remains significantly impaired. Patient also reports she is not up to date with recommended cancer screenings, so advise close PCP f/u to for that (we have ordered lung  cancer screening). Discharged home on apixaban  with plan for close PCP f/u.   Procedures: none   Consultations: none  Discharge Exam: Vitals:   05/03/24 0213 05/03/24 0600  BP: 122/77 121/80  Pulse: (!) 102 92  Resp:    Temp:    SpO2: 96% 95%    General: NAD Cardiovascular: RRR Respiratory: CTAB  Discharge Instructions   Discharge Instructions     Increase activity slowly   Complete by: As directed       Allergies as of 05/03/2024   No Known Allergies      Medication List     STOP taking these medications    doxycycline  100 MG capsule Commonly known as: VIBRAMYCIN    predniSONE  10 MG tablet Commonly known as: DELTASONE        TAKE these medications    Accu-Chek Softclix Lancets lancets 1 each 3 (three) times daily.   albuterol  (2.5 MG/3ML) 0.083% nebulizer solution Commonly known as: PROVENTIL  Take 3 mLs (2.5 mg total) by nebulization every 6 (six) hours as needed for wheezing or shortness of breath.   apixaban  5 MG Tabs tablet Commonly known as: ELIQUIS  Take 2 tabs by mouth twice a day through 12/25. On 12/26 begin taking 1 tab by mouth twice a day   atomoxetine 40 MG capsule Commonly known as: STRATTERA Take 40 mg by mouth every morning.   benzonatate  100 MG capsule Commonly known as: TESSALON  Take 1 capsule (100 mg total) by mouth 3 (three) times daily as needed for cough.   Blood Glucose Monitoring Suppl Devi 1 each by Does not  apply route in the morning, at noon, and at bedtime. May substitute to any manufacturer covered by patient's insurance.   cyanocobalamin  1000 MCG tablet Commonly known as: VITAMIN B12 Take 1 tablet (1,000 mcg total) by mouth daily.   furosemide  20 MG tablet Commonly known as: Lasix  Take 1 tablet (20 mg total) by mouth daily.   gabapentin  100 MG capsule Commonly known as: NEURONTIN  Take 1 capsule (100 mg total) by mouth 2 (two) times daily.   metFORMIN  500 MG 24 hr tablet Commonly known as:  GLUCOPHAGE -XR Take 1 tablet (500 mg total) by mouth daily with breakfast.   Mounjaro  5 MG/0.5ML Pen Generic drug: tirzepatide  Inject 5 mg into the skin once a week.   traZODone  100 MG tablet Commonly known as: DESYREL  Take 1 tablet (100 mg total) by mouth at bedtime.   valsartan -hydrochlorothiazide  80-12.5 MG tablet Commonly known as: DIOVAN -HCT Take 1 tablet by mouth daily.   Vitamin D  (Ergocalciferol ) 1.25 MG (50000 UNIT) Caps capsule Commonly known as: DRISDOL  Take 1 capsule (50,000 Units total) by mouth every 7 (seven) days.       Allergies[1]  Follow-up Information     Vincente Shivers, NP Follow up.   Specialty: General Practice Contact information: [address] [place] [address] [phone]                  The results of significant diagnostics from this hospitalization (including imaging, microbiology, ancillary and laboratory) are listed below for reference.    Significant Diagnostic Studies: ECHOCARDIOGRAM COMPLETE Result Date: 05/02/2024    ECHOCARDIOGRAM REPORT   Patient Name:   [name] Date of Exam: 05/02/2024 Medical Rec #:  [id number]      Height:       70.0 in Accession #:    [id number]     Weight:       326.3 lb Date of Birth:  09-29-1970     BSA:          2.570 m Patient Age:    53 years       BP:           129/91 mmHg Patient Gender: F              HR:           105 bpm. Exam Location:  [facility] Procedure: 2D Echo, Cardiac Doppler, Color Doppler and Intracardiac            Opacification Agent (Both Spectral and Color Flow Doppler were            utilized during procedure). Indications:     Pulmonary Embolus I26.09  History:         Patient has no prior history of Echocardiogram examinations.  Sonographer:     Thedora Louder RDCS, FASE Referring Phys:  [id number] POSEY ADEFESO Diagnosing Phys: Evalene Lunger MD  Sonographer Comments: Technically challenging study due to limited acoustic windows and patient is obese. Image acquisition  challenging due to patient body habitus. IMPRESSIONS  1. Left ventricular ejection fraction, by estimation, is 60 to 65%. The left ventricle has normal function. The left ventricle has no regional wall motion abnormalities. There is mild left ventricular hypertrophy. Left ventricular diastolic parameters are consistent with Grade I diastolic dysfunction (impaired relaxation).  2. Right ventricular systolic function is normal. The right ventricular size is normal. Tricuspid regurgitation signal is inadequate for assessing PA pressure.  3. The mitral valve is normal in structure. No evidence of mitral  valve regurgitation. No evidence of mitral stenosis.  4. The aortic valve is normal in structure. Aortic valve regurgitation is not visualized. No aortic stenosis is present.  5. The inferior vena cava is normal in size with greater than 50% respiratory variability, suggesting right atrial pressure of 3 mmHg. FINDINGS  Left Ventricle: Left ventricular ejection fraction, by estimation, is 60 to 65%. The left ventricle has normal function. The left ventricle has no regional wall motion abnormalities. Definity  contrast agent was given IV to delineate the left ventricular  endocardial borders. Strain was performed and the global longitudinal strain is indeterminate. The left ventricular internal cavity size was normal in size. There is mild left ventricular hypertrophy. Left ventricular diastolic parameters are consistent  with Grade I diastolic dysfunction (impaired relaxation). Right Ventricle: The right ventricular size is normal. No increase in right ventricular wall thickness. Right ventricular systolic function is normal. Tricuspid regurgitation signal is inadequate for assessing PA pressure. Left Atrium: Left atrial size was normal in size. Right Atrium: Right atrial size was normal in size. Pericardium: There is no evidence of pericardial effusion. Mitral Valve: The mitral valve is normal in structure. No evidence of  mitral valve regurgitation. No evidence of mitral valve stenosis. Tricuspid Valve: The tricuspid valve is normal in structure. Tricuspid valve regurgitation is not demonstrated. No evidence of tricuspid stenosis. Aortic Valve: The aortic valve is normal in structure. Aortic valve regurgitation is not visualized. No aortic stenosis is present. Pulmonic Valve: The pulmonic valve was normal in structure. Pulmonic valve regurgitation is not visualized. No evidence of pulmonic stenosis. Aorta: The aortic root is normal in size and structure. Venous: The inferior vena cava is normal in size with greater than 50% respiratory variability, suggesting right atrial pressure of 3 mmHg. IAS/Shunts: No atrial level shunt detected by color flow Doppler. Additional Comments: 3D was performed not requiring image post processing on an independent workstation and was indeterminate.  LEFT VENTRICLE PLAX 2D LVIDd:         4.30 cm      Diastology LVIDs:         3.30 cm      LV e' medial:    4.35 cm/s LV PW:         1.25 cm      LV E/e' medial:  14.2 LV IVS:        1.35 cm      LV e' lateral:   5.87 cm/s                             LV E/e' lateral: 10.5  LV Volumes (MOD) LV vol d, MOD A2C: 89.2 ml LV vol d, MOD A4C: 113.0 ml LV vol s, MOD A2C: 28.4 ml LV vol s, MOD A4C: 34.5 ml LV SV MOD A2C:     60.8 ml LV SV MOD A4C:     113.0 ml LV SV MOD BP:      71.4 ml MITRAL VALVE MV Area (PHT): 6.60 cm MV Decel Time: 115 msec MV E velocity: 61.80 cm/s MV A velocity: 89.60 cm/s MV E/A ratio:  0.69 Evalene Lunger MD Electronically signed by Evalene Lunger MD Signature Date/Time: 05/02/2024/2:55:51 PM    Final    US  Venous Img Lower Bilateral (DVT) Result Date: 05/01/2024 CLINICAL DATA:  Recent diagnosis of pulmonary emboli in a patient with acute onset shortness of breath EXAM: Bilateral LOWER EXTREMITY VENOUS DOPPLER ULTRASOUND TECHNIQUE: Gray-scale sonography with  compression, as well as color and duplex ultrasound, were performed to evaluate  the deep venous system(s) from the level of the common femoral vein through the popliteal and proximal calf veins. COMPARISON:  None Available. FINDINGS: VENOUS Normal compressibility of the bilateral common femoral, superficial femoral, and popliteal veins. Limited visualization of the right calf veins. Although there is limited visualization of the left calf veins, there is noncompressibility of the posterior tibial veins. Visualized portions of profunda femoral vein and great saphenous vein unremarkable. No filling defects to suggest DVT on grayscale or color Doppler imaging for the right lower extremity. The left lower extremity color Doppler imaging is consistent with DVT in the posterior tibial vein on the left. Doppler waveforms show normal direction of venous flow, normal respiratory plasticity and response to augmentation with the exception of the left posterior tibial veins. Limited views of the contralateral common femoral vein are unremarkable. OTHER None. Limitations: none IMPRESSION: Acute DVT in the left posterior tibial veins. No DVT in the right lower extremity (although limited visualization in the infrapopliteal vessels). These results will be called to the ordering clinician or representative by the Radiologist Assistant, and communication documented in the PACS or [employer]. Electronically Signed   By: Cordella Banner   On: 05/01/2024 14:49   CT Angio Chest PE W and/or Wo Contrast Result Date: 04/30/2024 EXAM: CTA CHEST 04/30/2024 06:22:04 PM TECHNIQUE: CTA of the chest was performed without and with the administration of 75 mL of iohexol  (OMNIPAQUE ) 350 MG/ML injection. Multiplanar reformatted images are provided for review. MIP images are provided for review. Automated exposure control, iterative reconstruction, and/or weight based adjustment of the mA/kV was utilized to reduce the radiation dose to as low as reasonably achievable. COMPARISON: None available. CLINICAL HISTORY:  Pulmonary embolism (PE) suspected, high prob. FINDINGS: PULMONARY ARTERIES: Pulmonary arteries are adequately opacified for evaluation. There are segmental right middle lobe pulmonary emboli. Main pulmonary artery is normal in caliber. MEDIASTINUM: Heart is mildly enlarged. There is diffuse wall thickening of the esophagus. This is more significant in the distal esophagus. There is no acute abnormality of the thoracic aorta. LYMPH NODES: No mediastinal, hilar or axillary lymphadenopathy. LUNGS AND PLEURA: There are patchy ground glass opacities minimally in both lung bases. There is central peribronchial wall thickening bilaterally. No focal consolidation or pulmonary edema. No evidence of pleural effusion or pneumothorax. UPPER ABDOMEN: Limited images of the upper abdomen are unremarkable. SOFT TISSUES AND BONES: Degenerative changes affect the spine. No acute soft tissue abnormality. IMPRESSION: 1. Segmental right middle lobe pulmonary emboli. 2. Mild cardiomegaly. 3. Patchy ground glass opacities minimally in both lung bases may represent atelectasis or mild edema. 4. Central peribronchial wall thickening bilaterally could be secondary to reactive airway disease or infectious/inflammatory process. . 5. Diffuse esophageal wall thickening, more significant in the distal esophagus. Follow-up endoscopy suggested to exclude underlying lesion distally. Electronically signed by: Greig Pique MD 04/30/2024 06:37 PM EST RP Workstation: [id number]   DG Chest 2 View Result Date: 04/30/2024 CLINICAL DATA:  cp EXAM: CHEST - 2 VIEW COMPARISON:  Sep 17, 2023 FINDINGS: No focal airspace consolidation, pleural effusion, or pneumothorax. No cardiomegaly. No acute fracture or destructive lesions. Multilevel thoracic osteophytosis. IMPRESSION: No acute cardiopulmonary abnormality. Electronically Signed   By: Rogelia Myers M.D.   On: 04/30/2024 16:09    Microbiology: No results found for this or any previous visit (from the  past 240 hours).   Labs: Basic Metabolic Panel: Recent Labs  Lab 04/27/24 1435  04/30/24 1511 05/01/24 0414  NA 135 135 134*  K 4.5 4.1 4.1  CL 97 100 99  CO2 27 24 24   GLUCOSE 200* 150* 140*  BUN 15 19 20   CREATININE 0.89 0.99 0.84  CALCIUM 10.2 9.3 9.0  MG  --   --  2.2  PHOS  --   --  4.4   Liver Function Tests: Recent Labs  Lab 04/27/24 1435 05/01/24 0414  AST 11 39  ALT 15 41  ALKPHOS 86 88  BILITOT 0.4 0.5  PROT 7.7 6.9  ALBUMIN 4.3 3.7   No results for input(s): LIPASE, AMYLASE in the last 168 hours. No results for input(s): AMMONIA in the last 168 hours. CBC: Recent Labs  Lab 04/27/24 1435 04/30/24 1511 05/01/24 0414 05/02/24 0401  WBC 18.0 Repeated and verified X2.* 19.6* 13.3* 12.2*  HGB 15.5* 16.2* 14.7 14.0  HCT 46.8* 48.9* 46.0 43.1  MCV 88.8 89.7 90.7 90.2  PLT 391.0 374 350 335   Cardiac Enzymes: No results for input(s): CKTOTAL, CKMB, CKMBINDEX, TROPONINI in the last 168 hours. BNP: BNP (last 3 results) No results for input(s): BNP in the last 8760 hours.  ProBNP (last 3 results) No results for input(s): PROBNP in the last 8760 hours.  CBG: Recent Labs  Lab 05/02/24 0814 05/02/24 1130 05/02/24 1629 05/02/24 2131 05/03/24 0812  GLUCAP 142* 96 106* 168* 132*       Signed:  Devaughn KATHEE Ban MD.  [facility] 05/03/2024, 9:41 AM     [1] No Known Allergies

## 2024-05-03 NOTE — Plan of Care (Signed)
  Problem: Clinical Measurements: Goal: Ability to maintain clinical measurements within normal limits will improve Outcome: Progressing Goal: Diagnostic test results will improve Outcome: Progressing Goal: Respiratory complications will improve Outcome: Progressing Goal: Cardiovascular complication will be avoided Outcome: Progressing   Problem: Activity: Goal: Risk for activity intolerance will decrease Outcome: Progressing   Problem: Coping: Goal: Level of anxiety will decrease Outcome: Progressing   Problem: Elimination: Goal: Will not experience complications related to urinary retention Outcome: Progressing   Problem: Pain Managment: Goal: General experience of comfort will improve and/or be controlled Outcome: Progressing   Problem: Safety: Goal: Ability to remain free from injury will improve Outcome: Progressing   Problem: Skin Integrity: Goal: Risk for impaired skin integrity will decrease Outcome: Progressing

## 2024-05-04 ENCOUNTER — Telehealth: Payer: Self-pay

## 2024-05-04 ENCOUNTER — Ambulatory Visit: Payer: Self-pay

## 2024-05-04 NOTE — Transitions of Care (Post Inpatient/ED Visit) (Signed)
 "  05/04/2024  Name: [name] MRN: [id number] DOB: 1970/08/24  Today's TOC FU Call Status: Today's TOC FU Call Status:: Successful TOC FU Call Completed TOC FU Call Complete Date: 05/04/24  Patient's Name and Date of Birth confirmed. Name, DOB  Transition Care Management Follow-up Telephone Call Date of Discharge: 05/03/24 Discharge Facility: [facility]) Type of Discharge: Inpatient Admission Primary Inpatient Discharge Diagnosis:: PE How have you been since you were released from the hospital?: Same Any questions or concerns?: Yes Patient Questions/Concerns:: still having pain  Items Reviewed: Did you receive and understand the discharge instructions provided?: No Medications obtained,verified, and reconciled?: Yes (Medications Reviewed) Any new allergies since your discharge?: No Dietary orders reviewed?: Yes Do you have support at home?: Yes People in Home [RPT]: child(ren), adult  Medications Reviewed Today: Medications Reviewed Today     Reviewed by Emmitt Pan, LPN (Licensed Practical Nurse) on 05/04/24 at 1300  Med List Status: <None>   Medication Order Taking? Sig Documenting Provider Last Dose Status Informant  Accu-Chek Softclix Lancets lancets [id number] Yes 1 each 3 (three) times daily. [provider]  Active Self, Pharmacy Records  albuterol  (PROVENTIL ) (2.5 MG/3ML) 0.083% nebulizer solution [id number] Yes Take 3 mLs (2.5 mg total) by nebulization every 6 (six) hours as needed for wheezing or shortness of breath. Gladis Elsie BROCKS, PA-C  Active Self, Pharmacy Records  apixaban  (ELIQUIS ) 5 MG TABS tablet [id number] Yes Take 2 tabs by mouth twice a day through 12/25. On 12/26 begin taking 1 tab by mouth twice a day Wouk, Devaughn Sayres, MD  Active   atomoxetine (STRATTERA) 40 MG capsule [id number] Yes Take 40 mg by mouth every morning. [provider]  Active Self, Pharmacy Records  benzonatate  (TESSALON ) 100 MG  capsule [id number] Yes Take 1 capsule (100 mg total) by mouth 3 (three) times daily as needed for cough. Corlis Burnard DEL, NP  Active Self, Pharmacy Records  Blood Glucose Monitoring Suppl DEVI [id number] Yes 1 each by Does not apply route in the morning, at noon, and at bedtime. May substitute to any manufacturer covered by patient's insurance. Vincente Shivers, NP  Active Self, Pharmacy Records  cyanocobalamin  (VITAMIN B12) 1000 MCG tablet [id number] Yes Take 1 tablet (1,000 mcg total) by mouth daily. Vincente Shivers, NP  Active Self, Pharmacy Records  furosemide  (LASIX ) 20 MG tablet [id number] Yes Take 1 tablet (20 mg total) by mouth daily. Vincente Shivers, NP  Active Self, Pharmacy Records  gabapentin  (NEURONTIN ) 100 MG capsule [id number] Yes Take 1 capsule (100 mg total) by mouth 2 (two) times daily. Vincente Shivers, NP  Active Self, Pharmacy Records  metFORMIN  (GLUCOPHAGE -XR) 500 MG 24 hr tablet [id number] Yes Take 1 tablet (500 mg total) by mouth daily with breakfast. Vincente Shivers, NP  Active Self, Pharmacy Records  tirzepatide  (MOUNJARO ) 5 MG/0.5ML Pen [id number] Yes Inject 5 mg into the skin once a week. Vincente Shivers, NP  Active Self, Pharmacy Records  traZODone  (DESYREL ) 100 MG tablet [id number] Yes Take 1 tablet (100 mg total) by mouth at bedtime. Vincente Shivers, NP  Active Self, Pharmacy Records  valsartan -hydrochlorothiazide  (DIOVAN -HCT) 80-12.5 MG tablet [id number] Yes Take 1 tablet by mouth daily. Corlis Burnard DEL, NP  Active Self, Pharmacy Records  Vitamin D , Ergocalciferol , (DRISDOL ) 1.25 MG (50000 UNIT) CAPS capsule [id number] Yes Take 1 capsule (50,000 Units total) by mouth every 7 (seven) days. Vincente Shivers, NP  Active Self, Pharmacy Records            Home Care  and Equipment/Supplies: Were Home Health Services Ordered?: NA Any new equipment or medical supplies ordered?: NA  Functional Questionnaire: Do you need assistance with bathing/showering or dressing?: No Do you need assistance with meal  preparation?: No Do you need assistance with eating?: No Do you have difficulty maintaining continence: No Do you need assistance with getting out of bed/getting out of a chair/moving?: No Do you have difficulty managing or taking your medications?: No  Follow up appointments reviewed: PCP Follow-up appointment confirmed?: Yes Date of PCP follow-up appointment?: 05/12/24 Follow[facility]up Provider[facility] Follow-up appointment confirmed?: NA Do you need transportation to your follow-up appointment?: No Do you understand care options if your condition(s) worsen?: Yes-patient verbalized understanding    SIGNATURE Julian Lemmings, LPN [facility] Nurse Health Advisor Direct Dial [phone]  "

## 2024-05-04 NOTE — Telephone Encounter (Signed)
 FYI Only or Action Required?: Action required by provider: clinical question for provider.  Patient was last seen in primary care on 04/27/2024 by Vincente Shivers, NP.  Called Nurse Triage reporting Shortness of Breath.  Symptoms began x 2 weeks.  Interventions attempted: Nothing.  Symptoms are: unchanged.  Triage Disposition: See HCP Within 4 Hours (Or PCP Triage)  Patient/caregiver understands and will follow disposition?: No, refuses disposition    Copied from CRM #[id number]. Topic: Clinical - Red Word Triage >> May 04, 2024  8:16 AM Deaijah H wrote: Red Word that prompted transfer to Nurse Triage: Hospital follow up - 2 blood clots / was unable to breath and chest tightness  .SABRA Not feeling 100% on blood thinner Eliquis , still having SOB    ----------------------------------------------------------------------- From previous Reason for Contact - Scheduling: Patient/patient representative is calling to schedule an appointment. Refer to attachments for appointment information. Reason for Disposition  [1] MILD difficulty breathing (e.g., minimal/no SOB at rest, SOB with walking, pulse < 100) AND [2] NEW-onset or WORSE than normal  Answer Assessment - Initial Assessment Questions 1. RESPIRATORY STATUS: Describe your breathing? (e.g., wheezing, shortness of breath, unable to speak, severe coughing)      SOB 2. ONSET: When did this breathing problem begin?      2 week 3. PATTERN Does the difficult breathing come and go, or has it been constant since it started?      constant 4. SEVERITY: How bad is your breathing? (e.g., mild, moderate, severe)      moderate 5. RECURRENT SYMPTOM: Have you had difficulty breathing before? If Yes, ask: When was the last time? and What happened that time?      Yes - 2 PE's one left leg and one right lung 6. CARDIAC HISTORY: Do you have any history of heart disease? (e.g., heart attack, angina, bypass surgery, angioplasty)      no 7.  LUNG HISTORY: Do you have any history of lung disease?  (e.g., pulmonary embolus, asthma, emphysema)     PE 8. CAUSE: What do you think is causing the breathing problem?      na 9. OTHER SYMPTOMS: Do you have any other symptoms? (e.g., chest pain, cough, dizziness, fever, runny nose)     Floaters in eyes 10. O2 SATURATION MONITOR:  Do you use an oxygen saturation monitor (pulse oximeter) at home? If Yes, ask: What is your reading (oxygen level) today? What is your usual oxygen saturation reading? (e.g., 95%)       na 11. PREGNANCY: Is there any chance you are pregnant? When was your last menstrual period?       na 12. TRAVEL: Have you traveled out of the country in the last month? (e.g., travel history, exposures)       na Pt was in hospital they found 2 PE's one left leg and one right lung -Discharged from hospital yesterday called to update PCP on her status states: Breathing Hurts with inhale.  Bilateral arm pain 8/10, heaviness, numbness , same as in hospital, - states it is related to low B12: has been on B12 .   Right side hurts.  Pt stated she is going to give the blood thinner a day or so to work and if not she is going to call back. Nurse encouraged pt to make appt sooner follow up appt: pt refused:  Protocols used: Breathing Difficulty-A-AH

## 2024-05-04 NOTE — Telephone Encounter (Signed)
 Called and spoke with patient and she is still having SOB and a lot of pain. Spoke with Carrol Aurora, NP about patient and advised again that patient needs to return to the ED. Patient verbalized understanding and stated that she would go back.

## 2024-05-05 ENCOUNTER — Ambulatory Visit: Payer: Self-pay

## 2024-05-05 ENCOUNTER — Emergency Department
Admission: EM | Admit: 2024-05-05 | Discharge: 2024-05-05 | Disposition: A | Discharge status: Home / Self Care | Payer: MEDICAID | Attending: Emergency Medicine | Admitting: Emergency Medicine

## 2024-05-05 ENCOUNTER — Other Ambulatory Visit: Payer: Self-pay

## 2024-05-05 ENCOUNTER — Emergency Department: Payer: MEDICAID

## 2024-05-05 DIAGNOSIS — E119 Type 2 diabetes mellitus without complications: Secondary | ICD-10-CM | POA: Diagnosis not present

## 2024-05-05 DIAGNOSIS — J449 Chronic obstructive pulmonary disease, unspecified: Secondary | ICD-10-CM | POA: Insufficient documentation

## 2024-05-05 DIAGNOSIS — I10 Essential (primary) hypertension: Secondary | ICD-10-CM | POA: Insufficient documentation

## 2024-05-05 DIAGNOSIS — R202 Paresthesia of skin: Secondary | ICD-10-CM | POA: Diagnosis present

## 2024-05-05 DIAGNOSIS — R0602 Shortness of breath: Secondary | ICD-10-CM | POA: Diagnosis not present

## 2024-05-05 DIAGNOSIS — M4802 Spinal stenosis, cervical region: Secondary | ICD-10-CM

## 2024-05-05 LAB — BASIC METABOLIC PANEL WITH GFR
Anion gap: 10 (ref 5–15)
BUN: 15 mg/dL (ref 6–20)
CO2: 25 mmol/L (ref 22–32)
Calcium: 9.1 mg/dL (ref 8.9–10.3)
Chloride: 103 mmol/L (ref 98–111)
Creatinine, Ser: 0.85 mg/dL (ref 0.44–1.00)
GFR, Estimated: >60 mL/min
Glucose, Bld: 143 mg/dL — ABNORMAL HIGH (ref 70–99)
Potassium: 4.5 mmol/L (ref 3.5–5.1)
Sodium: 138 mmol/L (ref 135–145)

## 2024-05-05 LAB — CBC
HCT: 47.5 % — ABNORMAL HIGH (ref 36.0–46.0)
Hemoglobin: 15.2 g/dL — ABNORMAL HIGH (ref 12.0–15.0)
MCH: 29 pg (ref 26.0–34.0)
MCHC: 32 g/dL (ref 30.0–36.0)
MCV: 90.5 fL (ref 80.0–100.0)
Platelets: 422 K/uL — ABNORMAL HIGH (ref 150–400)
RBC: 5.25 MIL/uL — ABNORMAL HIGH (ref 3.87–5.11)
RDW: 14.4 % (ref 11.5–15.5)
WBC: 14.7 K/uL — ABNORMAL HIGH (ref 4.0–10.5)
nRBC: 0 % (ref 0.0–0.2)

## 2024-05-05 MED ORDER — OXYCODONE HCL 5 MG PO TABS
5.0000 mg | ORAL_TABLET | Freq: Once | ORAL | Status: AC
  Administered 2024-05-05: 5 mg via ORAL
  Filled 2024-05-05: qty 1

## 2024-05-05 MED ORDER — SODIUM CHLORIDE 0.9 % IV BOLUS: 500.0000 mL | Freq: Once | INTRAVENOUS | Status: DC

## 2024-05-05 NOTE — Telephone Encounter (Signed)
 Noted and reviewed notes for today.   However, Patient has been advised multiple times to go to the ER yesterday.  While she was talking to the CMA on 05/04/24, patient was placed on hold to discuss patient's symptoms with me.  After I advised CMA to ask patient to go to the ER; CMA communicated that to the patient and patient verbalized understanding.   -Carrol Aurora, DNP, AGNP-C 05/05/2024 10:43 AM

## 2024-05-05 NOTE — Discharge Instructions (Addendum)
 I suspect that this could be related to your median nerve you could have some the like carpal tunnel.  Seeing a orthopedic doctor would be the first step for evaluation of this.  They may have people to do some steroid injections or things to try to help your symptoms.  There is a walk-in clinic over at [facility] that you could go be seen whenever you want from 9 AM to 9P or you can make an appointment with orthopedics at [facility] clinic.  Your oxygen levels are reassuring we discussed CT imaging to evaluate for worsening blood clot but have opted to hold off but if you develop a change in your breathing or any other concerns please return to the ER for repeat evaluation

## 2024-05-05 NOTE — ED Provider Notes (Signed)
" °  Physical Exam  BP 133/70   Pulse 98   Temp 99.1 F (37.3 C)   Resp 19   LMP 01/30/2016   SpO2 97%   Physical Exam  Procedures  Procedures  ED Course / MDM    Medical Decision Making Amount and/or Complexity of Data Reviewed Labs: ordered. Radiology: ordered.  Risk Prescription drug management.   This patient's care was signed out to me pending completion of workup.  Bilateral upper venous ultrasounds did not show any evidence of DVT.  Brain MRI: IMPRESSION: 1. Moderate, age-advanced T2 hyperintensities in the white matter which could be due to chronic microvascular ischemic disease or chronic demyelination. 2. No acute findings.  Cervical spine MRI: IMPRESSION: 1. Moderately motion limited study. 2. Suspected severe bilateral foraminal stenosis at C5-C6 and C6-C7 and moderate canal stenosis at C3-C4, C4-C5, C5-C6, and C6-C7. 3. Moderate to severe right foraminal stenosis at C4-C5 and C7-T1 and multilevel moderate foraminal stenosis as detailed above.  Findings were discussed with the patient.  Discussed following up with orthopedics as previously instructed by Dr. Hobert as well as following up with neurosurgery for foraminal stenosis noted on MRI.  She will also follow-up with her primary care physician.  She will continue her blood thinner as prescribed.  She continues to report improvement in her shortness of breath.  Tachycardia has improved although she has not yet received her fluids.  She will return immediately to the emergency department for any new or worsening symptoms.      Rexford Reche HERO, MD 05/05/24 2153  "

## 2024-05-05 NOTE — ED Triage Notes (Signed)
 Pt comes with c/o pain in legs and arms. Pt was seen here recently for blood clot in lung and leg. Pt states she is on the blood thinner. Pt states pain is worse and not better.   Pt is diabetic but states sugars are fine. Pt denies any belly pain.

## 2024-05-05 NOTE — ED Provider Notes (Signed)
 "  [facility] Provider Note    Event Date/Time   First MD Initiated Contact with Patient 05/05/24 1325     (approximate)   History   Blood clots   HPI  [name] is a 53 y.o. female who comes in with concerns for worsening symptoms.  Patient has a history of hypertension, COPD, diabetes with shortness of breath.  She was discharged from the hospital on 12/21 with concerns for pulmonary embolism.  She was found to have a left leg DVT and right middle lower lobe segmental PE.  There is no hypoxia and no right heart strain TTE was normal.  Patient was discharged on apixaban .  Patient reports that the main reason that she came back was not because of her shortness of breath.  She reports that actually improved significantly since being in the hospital.  She reports only some pain when she moves in her chest but denies any active chest pain or shortness of breath.  Patient is very tearful however stating that they never addressed her other concern.  She reports having some pain in her bilateral arms mostly from the elbows down with tingling sensation on the first second third finger.  She reports this been ongoing for weeks and that she seen her primary care doctor and they put her on B12 injections but has not improved.  She states that she is continue to have significant pain at night that is made her very upset.  She states that she was recently started on SSRI to help with some depression but denies any SI and declines wanting to see psychiatry today.  She denies any worsening swelling in her leg or sensation changes in her legs.  She reports her main concern was just issues with her arms.  Denies any falls.  She reports that these symptoms that she had were present prior to starting the apixaban .   Physical Exam   Triage Vital Signs: ED Triage Vitals  Encounter Vitals Group     BP 05/05/24 1247 (!) 154/118     Girls Systolic BP Percentile --      Girls  Diastolic BP Percentile --      Boys Systolic BP Percentile --      Boys Diastolic BP Percentile --      Pulse Rate 05/05/24 1247 (!) 116     Resp 05/05/24 1247 18     Temp 05/05/24 1247 99.1 F (37.3 C)     Temp src --      SpO2 05/05/24 1247 96 %     Weight --      Height --      Head Circumference --      Peak Flow --      Pain Score 05/05/24 1246 10     Pain Loc --      Pain Education --      Exclude from Growth Chart --     Most recent vital signs: Vitals:   05/05/24 1247  BP: (!) 154/118  Pulse: (!) 116  Resp: 18  Temp: 99.1 F (37.3 C)  SpO2: 96%     General: Awake, no distress.  Patient is tearful CV:  Good peripheral perfusion.  Resp:  Normal effort.  Tachycardic Abd:  No distention.  Soft nontender Other:  No significant swelling in legs.  No calf tenderness Reports sensation changes in the 1st, 2nd and 3rd finger bilaterally.  Some mild decreased grip strength bilaterally.  Warm and  well-perfused hands.  Equal strength however.  All of patient's finger movements are intact.  Pt tearful but denies any SI    ED Results / Procedures / Treatments   Labs (all labs ordered are listed, but only abnormal results are displayed) Labs Reviewed  CBC - Abnormal; Notable for the following components:      Result Value   WBC 14.7 (*)    RBC 5.25 (*)    Hemoglobin 15.2 (*)    HCT 47.5 (*)    Platelets 422 (*)    All other components within normal limits  BASIC METABOLIC PANEL WITH GFR - Abnormal; Notable for the following components:   Glucose, Bld 143 (*)    All other components within normal limits     EKG  My interpretation of EKG:    RADIOLOGY Pending    PROCEDURES:  Critical Care performed: No  Procedures   MEDICATIONS ORDERED IN ED: Medications - No data to display   IMPRESSION / MDM / ASSESSMENT AND PLAN / ED COURSE  I reviewed the triage vital signs and the nursing notes.   Patient's presentation is most consistent with acute  presentation with potential threat to life or bodily function.  Patient comes in very tearful reporting significant pain in her arms bilaterally.  There is no swelling in her arms and she is already on Eliqius but we can get ultrasound to make sure no large clot that required a thrombectomy but this seems less likely.  She reports pain and tingling with hand weakness and I wonder if this could be more like a carpal tunnel problem but we will get MRIs to ensure no evidence of mass, stroke, cervical pathology.  We discussed that if this was negative that she would need to follow-up with an orthopedic or neurology doctor for further evaluation.  Offered her CT imaging to reevaluate her blood clots as if these are worsening then we would want to know.  Patient reports that her breathing is actually improved significantly and she denies any worsening symptoms and so did not want to have a repeat CT imaging done.  Also on evaluation of her legs they look stable from when she was recent admitted there is no evidence of worsening blood clot she denies any worsening discomfort or pain in it therefore I do not think we need to repeat this.  I will add on a troponin to evaluate for her heart but she reports pain more just with movement.  Do not hear any wheezing at this time to suggest a asthma flare and she reports being on steroids about 2 weeks ago for that.  Patient is very tearful and reports being started on SSRI so I did discuss with her about her mood and she denies any SI declines need to see psychiatry today.  She reports more just being concerned about her pain and wanting to come to a solution for this.  BMP is reassuring.  CBC shows slightly elevated white count.  Patient handed off to oncoming team pending EKG, MRI  At this time I suspect her tachycardia is more likely related to patient's distress and pain as she is very tearful in triage and declined repeat CT imaging  Patient handed off to oncoming  team pending the rest of her results  The patient is on the cardiac monitor to evaluate for evidence of arrhythmia and/or significant heart rate changes.      FINAL CLINICAL IMPRESSION(S) / ED DIAGNOSES   Final diagnoses:  Hand tingling     Rx / DC Orders   ED Discharge Orders     None        Note:  This document was prepared using Dragon voice recognition software and may include unintentional dictation errors.   Ernest Ronal BRAVO, MD 05/05/24 [phone]  "

## 2024-05-05 NOTE — Telephone Encounter (Signed)
 Per patient chart; patient returned to the ER today.

## 2024-05-05 NOTE — Telephone Encounter (Signed)
" ° ° ° °  Copied from CRM [id number]. Topic: Clinical - Red Word Triage >> May 05, 2024  9:54 AM China J wrote: [facility] that prompted transfer to Nurse Triage: Trouble breathing and excruciating pain in both arms. Patient is also having pain in her lungs where the hospital found 2 blood clots. "

## 2024-05-05 NOTE — Telephone Encounter (Addendum)
 Patient with unchanged synmptoms from yesterday, sob, bilat arm pain. Provider communication reviewed. Provider referred to ED. Pt states no one told her to go to ED.  Advised ED again.  Triage RN

## 2024-05-08 NOTE — Telephone Encounter (Signed)
 Reason for Disposition . Patient sounds very sick or weak to the triager  Protocols used: Breathing Difficulty-A-AH

## 2024-05-12 ENCOUNTER — Encounter: Payer: Self-pay | Admitting: General Practice

## 2024-05-12 ENCOUNTER — Ambulatory Visit (INDEPENDENT_AMBULATORY_CARE_PROVIDER_SITE_OTHER): Payer: MEDICAID | Admitting: General Practice

## 2024-05-12 VITALS — BP 144/86 | HR 107 | Temp 97.8°F | Ht 70.0 in | Wt 330.0 lb

## 2024-05-12 DIAGNOSIS — M79602 Pain in left arm: Secondary | ICD-10-CM

## 2024-05-12 DIAGNOSIS — M79601 Pain in right arm: Secondary | ICD-10-CM | POA: Diagnosis not present

## 2024-05-12 DIAGNOSIS — R0602 Shortness of breath: Secondary | ICD-10-CM

## 2024-05-12 DIAGNOSIS — I824Z2 Acute embolism and thrombosis of unspecified deep veins of left distal lower extremity: Secondary | ICD-10-CM | POA: Insufficient documentation

## 2024-05-12 DIAGNOSIS — F339 Major depressive disorder, recurrent, unspecified: Secondary | ICD-10-CM

## 2024-05-12 DIAGNOSIS — M170 Bilateral primary osteoarthritis of knee: Secondary | ICD-10-CM

## 2024-05-12 DIAGNOSIS — Z6841 Body Mass Index (BMI) 40.0 and over, adult: Secondary | ICD-10-CM | POA: Diagnosis not present

## 2024-05-12 DIAGNOSIS — E1122 Type 2 diabetes mellitus with diabetic chronic kidney disease: Secondary | ICD-10-CM

## 2024-05-12 DIAGNOSIS — I2699 Other pulmonary embolism without acute cor pulmonale: Secondary | ICD-10-CM

## 2024-05-12 DIAGNOSIS — M4802 Spinal stenosis, cervical region: Secondary | ICD-10-CM | POA: Insufficient documentation

## 2024-05-12 DIAGNOSIS — Z720 Tobacco use: Secondary | ICD-10-CM

## 2024-05-12 DIAGNOSIS — E66813 Obesity, class 3: Secondary | ICD-10-CM | POA: Diagnosis not present

## 2024-05-12 DIAGNOSIS — I10 Essential (primary) hypertension: Secondary | ICD-10-CM | POA: Diagnosis not present

## 2024-05-12 DIAGNOSIS — Z09 Encounter for follow-up examination after completed treatment for conditions other than malignant neoplasm: Secondary | ICD-10-CM | POA: Insufficient documentation

## 2024-05-12 DIAGNOSIS — N181 Chronic kidney disease, stage 1: Secondary | ICD-10-CM | POA: Diagnosis not present

## 2024-05-12 DIAGNOSIS — F5101 Primary insomnia: Secondary | ICD-10-CM | POA: Diagnosis not present

## 2024-05-12 MED ORDER — VALSARTAN-HYDROCHLOROTHIAZIDE 80-12.5 MG PO TABS: 1.0000 | ORAL_TABLET | Freq: Every day | ORAL | 0 refills | Status: DC

## 2024-05-12 MED ORDER — GABAPENTIN 100 MG PO CAPS: 200.0000 mg | ORAL_CAPSULE | Freq: Two times a day (BID) | ORAL | 0 refills | Status: DC

## 2024-05-12 MED ORDER — TIRZEPATIDE 7.5 MG/0.5ML ~~LOC~~ SOAJ: 7.5000 mg | SUBCUTANEOUS | 2 refills | Status: AC

## 2024-05-12 NOTE — Progress Notes (Signed)
 "  Established Patient Office Visit  Subjective   Patient ID: [name], female    DOB: 1970-07-04  Age: 53 y.o. MRN: [id number]  Chief Complaint  Patient presents with   Hospitalization Follow-up    HPI  [name] is a 53 year old female with past medical history of HTN, PE, DM2, OA, Insomnia, obesity, tobacco abuse, vitamin d  deficiency presents today for hospitalization follow up.   Discussed the use of AI scribe software for clinical note transcription with the patient, who gave verbal consent to proceed.  History of Present Illness She experiences significant right-sided rib pain and bilateral arm pain, which led her to seek emergency care on April 30, 2024. She had been experiencing shortness of breath and was previously seen at urgent care on April 25, 2024, where she was prescribed doxycycline  and prednisone . A chest CTA at the hospital revealed a right middle lobe pulmonary embolism, mild cardiomegaly, and atelectasis. She was started on Eliquis , initially taking two tablets twice a day until May 07, 2024, and then one tablet twice a day.   She reports a clot in her left leg, identified during a vascular ultrasound, which was attributed to immobility, although she notes that the left leg is the one she uses most actively. She experiences difficulty walking and uses an office chair to move around her house. She is frustrated with her immobility and its impact on her quality of life.  She has been experiencing arm pain and tingling, which prompted a return to the ER on May 05, 2024. An MRI of the cervical spine showed significant stenosis. The pain is severe enough to interfere with her sleep and daily activities. She has been prescribed gabapentin  100 mg twice a day. This seems to be helping some.   She is currently taking Suboxone for pain management through online clinic, which has helped with her pain but affects her sleep.   She was started on  Strattera for depression, which she receives through telemedicine. She notes that the antidepressant takes time to work and is not currently in therapy.  Her past medical history includes diabetes, for which she takes metformin , mounjaro  5 mg once weekly. She has noticed that it is suppressing her diet however she did gain some weight over christmas.   She has a history of hypertension, managed with Lasix  and valsartan .   She has a history of smoking but has reduced her smoking due to pain. She reports ongoing shortness of breath, which she has experienced even before the lung clot was diagnosed.  She is concerned about her weight and its impact on her ability to walk, as well as her housing situation and loss of employment. She is currently on Medicaid and is worried about maintaining her coverage.    Patient Active Problem List   Diagnosis Date Noted   Hospital discharge follow-up 05/12/2024   Acute deep vein thrombosis (DVT) of distal vein of left lower extremity (HCC) 05/12/2024   Cervical spinal stenosis 05/12/2024   Pulmonary embolism (HCC) 04/30/2024   Bilateral arm pain 04/27/2024   Dysuria 09/17/2023   Shortness of breath 09/17/2023   Chronic otitis externa of both ears 08/20/2023   Encounter for screening involving social determinants of health (SDoH) 08/20/2023   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) 08/15/2023   Vitamin D  deficiency 08/15/2023   Insomnia 08/06/2023   Obesity 08/06/2023   Continuous tobacco abuse 08/06/2023   Osteoarthritis of knee 08/05/2023  Depression, recurrent 04/12/2017   Essential hypertension 04/12/2017   Past Medical History:  Diagnosis Date   Anxiety    Arthritis    Cholecystitis with cholelithiasis 02/25/2013   Complication of anesthesia    woke up crying after tubes tied, anxiety attack   Depression    Depression 04/12/2017   GERD (gastroesophageal reflux disease)    Hypertension    Migraine    no longer  have these   MRSA carrier    Past Surgical History:  Procedure Laterality Date   CHOLECYSTECTOMY     RADIOLOGY WITH ANESTHESIA Bilateral 08/08/2021   Procedure: MRI WITH BILATERAL HIPWITHOUT CONTRAST,RIGHT  KNEE WITHOUT CONTRAST;  Surgeon: Radiologist, Medication, MD;  Location: [facility] OR;  Service: Radiology;  Laterality: Bilateral;   TONSILLECTOMY     TUBAL LIGATION     Allergies[1]       05/12/2024    3:19 PM 04/27/2024    2:35 PM 04/01/2024   12:07 PM  Depression screen PHQ 2/9  Decreased Interest 1 3 3   Down, Depressed, Hopeless 2 3 3   PHQ - 2 Score 3 6 6   Altered sleeping 2 3 3   Tired, decreased energy 2 3 3   Change in appetite 2 1 3   Feeling bad or failure about yourself  2 0 3  Trouble concentrating 2 3 3   Moving slowly or fidgety/restless 0 0 0  Suicidal thoughts 1 2 0  PHQ-9 Score 14 18 21   Difficult doing work/chores Very difficult Extremely dIfficult Very difficult       05/12/2024    3:19 PM 04/27/2024    2:35 PM 12/20/2023    9:14 AM 09/17/2023   11:43 AM  GAD 7 : Generalized Anxiety Score  Nervous, Anxious, on Edge 2 3 2 2   Control/stop worrying 2 3 2 2   Worry too much - different things 2 3 2 2   Trouble relaxing 2 3 2 3   Restless 2 3 2  0  Easily annoyed or irritable 2 3 2 2   Afraid - awful might happen 2 2 2  0  Total GAD 7 Score 14 20 14 11   Anxiety Difficulty Very difficult Extremely difficult Somewhat difficult Somewhat difficult      Review of Systems  Constitutional:  Negative for chills and fever.  Respiratory:  Negative for shortness of breath.   Cardiovascular:  Negative for chest pain.  Gastrointestinal:  Negative for abdominal pain, constipation, diarrhea, heartburn, nausea and vomiting.  Genitourinary:  Negative for dysuria, frequency and urgency.  Neurological:  Negative for dizziness and headaches.  Endo/Heme/Allergies:  Negative for polydipsia.  Psychiatric/Behavioral:  Negative for depression and suicidal ideas. The patient is not  nervous/anxious.       Objective:     BP (!) 144/86   Pulse (!) 107   Temp 97.8 F (36.6 C) (Temporal)   Ht 5' 10 (1.778 m)   Wt (!) 330 lb (149.7 kg)   LMP 01/30/2016   SpO2 96%   BMI 47.35 kg/m  BP Readings from Last 3 Encounters:  05/12/24 (!) 144/86  05/05/24 133/70  05/03/24 121/80   Wt Readings from Last 3 Encounters:  05/12/24 (!) 330 lb (149.7 kg)  04/30/24 (!) 326 lb 4.5 oz (148 kg)  04/27/24 (!) 328 lb (148.8 kg)      Physical Exam Vitals and nursing note reviewed.  Constitutional:      Appearance: Normal appearance.  Cardiovascular:     Rate and Rhythm: Normal rate and regular rhythm.  Pulses: Normal pulses.     Heart sounds: Normal heart sounds.  Pulmonary:     Effort: Pulmonary effort is normal.     Breath sounds: Normal breath sounds.  Neurological:     Mental Status: She is alert and oriented to person, place, and time.  Psychiatric:        Mood and Affect: Mood normal.        Behavior: Behavior normal.        Thought Content: Thought content normal.        Judgment: Judgment normal.     No results found for any visits on 05/12/24.    The ASCVD Risk score (Arnett DK, et al., 2019) failed to calculate for the following reasons:   The valid total cholesterol range is 130 to 320 mg/dL    Assessment & Plan:  Hospital discharge follow-up Assessment & Plan: Reviewed hospital notes, labs and imaging at length.    Other acute pulmonary embolism, unspecified whether acute cor pulmonale present (HCC) -     Pulmonary Visit  Type 2 diabetes mellitus with stage 1 chronic kidney disease, unspecified whether long term insulin  use (HCC) -     Tirzepatide ; Inject 7.5 mg into the skin once a week.  Dispense: 2 mL; Refill: 2  Essential hypertension -     Ambulatory referral to Cardiology -     Valsartan -hydroCHLOROthiazide ; Take 1 tablet by mouth daily.  Dispense: 90 tablet; Refill: 0 -     CBC  Bilateral arm pain -     Gabapentin ; Take 2  capsules (200 mg total) by mouth 2 (two) times daily.  Dispense: 120 capsule; Refill: 0  Continuous tobacco abuse -     Pulmonary Visit  Shortness of breath -     Pulmonary Visit  Depression, recurrent  Cervical spinal stenosis  Acute deep vein thrombosis (DVT) of distal vein of left lower extremity (HCC)  Primary osteoarthritis of both knees  Primary insomnia  Class 3 severe obesity due to excess calories with serious comorbidity and body mass index (BMI) of 45.0 to 49.9 in adult [facility])    Assessment and Plan Assessment & Plan Acute pulmonary embolism and left lower extremity deep vein thrombosis Acute PE in right middle lobe and left lower extremity DVT likely due to immobility. Dosing confusion with Eliquis . - Instructed to adjust Eliquis  to one tablet twice a day starting tomorrow. - Reopened referral to pulmonology for further evaluation. - Referred to cardiology for evaluation of cardiac function and blood pressure management.  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 MRI shows significant stenosis. Gabapentin  initially effective, now less so. - Increased gabapentin  to two tablets twice a day. - Referred to neurosurgery for further evaluation. - Referred to orthopedic surgery for potential surgical intervention.  Type 2 diabetes mellitus with stage 1 chronic kidney disease Type 2 diabetes with stage 1 CKD. Weight gain noted during Christmas. - Increased Mounjaro  to 7.5 mg once a week after completing current supply.  Essential hypertension Hypertension not well controlled on valsartan  and Lasix . Blood pressure remains elevated. Improved with second reading. - Continue valsartan  and Lasix . - Referred to cardiology for further evaluation and management.  Class 3 severe obesity due to excess calories Severe obesity contributing to overall health issues. - Increased Mounjaro  to 7.5  mg once a week after completing current supply.  Primary osteoarthritis of both knees Osteoarthritis in both knees contributing to mobility issues. - Encouraged  weight loss to improve knee function.  Chronic pain syndrome Chronic pain managed with Suboxone and gabapentin . Pain significantly impacts quality of life. - Continue Suboxone as prescribed by Dr. Jearldine. - Increased gabapentin  to two tablets twice a day.  Depression Managed with Strattera. Reports of feeling depressed and pain impacting mental health. - Continue Strattera as prescribed by [facility]. - Recommended therapy, though she is not interested.  Insomnia Managed with trazodone . Reports of intermittent sleep due to Suboxone use. - Continue trazodone  as prescribed.  Continuous tobacco abuse Continued tobacco use despite efforts to reduce smoking. Smoking contributes to shortness of breath and potential lung issues. - Encouraged smoking cessation.   Return in about 3 months (around 08/10/2024) for chronic care management. SABRA Carrol Aurora, NP    [1] No Known Allergies  "

## 2024-05-12 NOTE — Assessment & Plan Note (Signed)
 Reviewed hospital notes, labs and imaging at length.

## 2024-05-12 NOTE — Patient Instructions (Addendum)
 Alicia Hussar, MD In 1 week.  Specialty: Orthopedic Surgery Contact information: [address] [place] [address] [phone]  [facility], [facility] In 1 week.  Contact information: [address] [place] [address] [phone]  [name], MD.  Specialty: Neurosurgery Contact information: [address] [place] [address] [phone]  Continue Eliquis  5 mg one tablet twice daily.  Increase mounjaro  to 7.5 mg once weekly.  Increase gabapentin  to 200 mg twice daily.   Follow up in 3 months for chronic care management.   It was a pleasure to see you today!

## 2024-05-13 ENCOUNTER — Ambulatory Visit: Payer: Self-pay | Admitting: General Practice

## 2024-05-13 LAB — CBC
HCT: 45.3 % (ref 36.0–46.0)
Hemoglobin: 15.3 g/dL — ABNORMAL HIGH (ref 12.0–15.0)
MCHC: 33.7 g/dL (ref 30.0–36.0)
MCV: 89.1 fl (ref 78.0–100.0)
Platelets: 323 K/uL (ref 150.0–400.0)
RBC: 5.09 Mil/uL (ref 3.87–5.11)
RDW: 14.7 % (ref 11.5–15.5)
WBC: 8.3 K/uL (ref 4.0–10.5)

## 2024-05-18 ENCOUNTER — Ambulatory Visit: Payer: MEDICAID | Admitting: Student in an Organized Health Care Education/Training Program

## 2024-05-18 ENCOUNTER — Encounter: Payer: Self-pay | Admitting: General Practice

## 2024-05-20 ENCOUNTER — Encounter: Payer: Self-pay | Admitting: Student in an Organized Health Care Education/Training Program

## 2024-05-20 ENCOUNTER — Ambulatory Visit: Payer: MEDICAID | Admitting: Student in an Organized Health Care Education/Training Program

## 2024-05-20 VITALS — BP 150/86 | HR 99 | Temp 97.6°F | Ht 70.0 in | Wt 344.4 lb

## 2024-05-20 DIAGNOSIS — I2699 Other pulmonary embolism without acute cor pulmonale: Secondary | ICD-10-CM | POA: Diagnosis not present

## 2024-05-20 DIAGNOSIS — R0602 Shortness of breath: Secondary | ICD-10-CM

## 2024-05-20 DIAGNOSIS — F17200 Nicotine dependence, unspecified, uncomplicated: Secondary | ICD-10-CM

## 2024-05-20 DIAGNOSIS — F17210 Nicotine dependence, cigarettes, uncomplicated: Secondary | ICD-10-CM

## 2024-05-20 MED ORDER — NICOTINE 21 MG/24HR TD PT24: 21.0000 mg | MEDICATED_PATCH | TRANSDERMAL | 0 refills | Status: AC

## 2024-05-20 MED ORDER — NICOTINE POLACRILEX 2 MG MT LOZG: 2.0000 mg | LOZENGE | OROMUCOSAL | 3 refills | Status: AC | PRN

## 2024-05-20 MED ORDER — NICOTINE 7 MG/24HR TD PT24: 7.0000 mg | MEDICATED_PATCH | TRANSDERMAL | 0 refills | Status: AC

## 2024-05-20 MED ORDER — APIXABAN 5 MG PO TABS: 5.0000 mg | ORAL_TABLET | Freq: Two times a day (BID) | ORAL | 11 refills | Status: AC

## 2024-05-20 MED ORDER — NICOTINE 14 MG/24HR TD PT24: 14.0000 mg | MEDICATED_PATCH | TRANSDERMAL | 0 refills | Status: AC

## 2024-05-20 NOTE — Progress Notes (Signed)
 " Assessment & Plan  #Pulmonary embolism  Right middle lobe PE diagnosed in December, associated with immobility. On Eliquis  with improved chest pain and pleurisy. Persistent shortness of breath likely a result of smoking related lung disease, though the PE certainly could be contributing. Echocardiogram in December showed normal RV function without signs of pulmonary hypertension. She has persistent risk factors with ongoing immobility, and discussed with her that we would continue anti-coagulation until such a time that she no longer has a risk factor for PE. Following resolution of risk factors, I would favor extending her anti-coagulation, possibly at reduced dose, for another year. We will be discussing this further at follow up.   - Continue Eliquis  until mobility improves, then switch to lower dose and extend for a year post-mobility. - Monitor for bleeding signs, stop Eliquis  if bleeding occurs. - Order breathing test within three months. - apixaban  (ELIQUIS ) 5 MG TABS tablet; Take 1 tablet (5 mg total) by mouth 2 (two) times daily.  Dispense: 60 tablet; Refill: 11  #Shortness of breath  Shortness of breath in the setting of PE, obesity, as well as smoking. Will obtain PFT's to assess for obstructive lung disease and rule out COPD. This will also allow evaluation of DLCO and lung volumes.  - Pulmonary Function Test; Future  #Tobacco Use Disorder  Long-term smoker, one pack per day. Previous cessation attempts unsuccessful. Smoking likely contributing to shortness of breath. Discussed hypnosis benefits. Advised against vapes due to addiction risk.  - Prescribe nicotine  patches: 21 mg for six weeks, 14 mg for two weeks, 7 mg for two weeks. - Prescribe nicotine  lozenges as needed for cravings. - Provide information on [facility]  Smoking Cessation Program. - nicotine  (NICODERM CQ  - DOSED IN MG/24 HOURS) 21 mg/24hr patch; Place 1 patch (21 mg total) onto the skin daily.  Dispense: 42  patch; Refill: 0 - nicotine  (NICODERM CQ  - DOSED IN MG/24 HOURS) 14 mg/24hr patch; Place 1 patch (14 mg total) onto the skin daily for 14 days.  Dispense: 14 patch; Refill: 0 - nicotine  (NICODERM CQ  - DOSED IN MG/24 HR) 7 mg/24hr patch; Place 1 patch (7 mg total) onto the skin daily for 14 days.  Dispense: 14 patch; Refill: 0 - nicotine  polacrilex (NICOTINE  MINI) 2 MG lozenge; Take 1 lozenge (2 mg total) by mouth every 2 (two) hours as needed for smoking cessation.  Dispense: 72 lozenge; Refill: 3 - Advise against vapes for cessation.   Return in about 3 months (around 08/18/2024).  Belva November, MD [facility]  I spent 60 minutes caring for this patient today, including preparing to see the patient, obtaining a medical history , reviewing a separately obtained history, performing a medically appropriate examination and/or evaluation, counseling and educating the patient/family/caregiver, ordering medications, tests, or procedures, documenting clinical information in the electronic health record, and independently interpreting results (not separately reported/billed) and communicating results to the patient/family/caregiver  End of visit medications:  Meds ordered this encounter  Medications   apixaban  (ELIQUIS ) 5 MG TABS tablet    Sig: Take 1 tablet (5 mg total) by mouth 2 (two) times daily.    Dispense:  60 tablet    Refill:  11   nicotine  (NICODERM CQ  - DOSED IN MG/24 HOURS) 21 mg/24hr patch    Sig: Place 1 patch (21 mg total) onto the skin daily.    Dispense:  42 patch    Refill:  0   nicotine  (NICODERM CQ  - DOSED IN MG/24 HOURS)  14 mg/24hr patch    Sig: Place 1 patch (14 mg total) onto the skin daily for 14 days.    Dispense:  14 patch    Refill:  0   nicotine  (NICODERM CQ  - DOSED IN MG/24 HR) 7 mg/24hr patch    Sig: Place 1 patch (7 mg total) onto the skin daily for 14 days.    Dispense:  14 patch    Refill:  0   nicotine  polacrilex (NICOTINE  MINI) 2 MG  lozenge    Sig: Take 1 lozenge (2 mg total) by mouth every 2 (two) hours as needed for smoking cessation.    Dispense:  72 lozenge    Refill:  3    Current Medications[1]   Subjective:   PATIENT ID: [name] GENDER: female DOB: [birth year], MRN: [id number]  Chief Complaint  Patient presents with   Consult    Patient was hospitalized on 04/30/2024 for shortness of breath on exertion and occasional at rest. Patient has history of Pulmonary embolism.     HPI  Discussed the use of AI scribe software for clinical note transcription with the patient, who gave verbal consent to proceed.  History of Present Illness  [name] [name] is a 54 year old female with a history of pulmonary embolism and DVT who presents for post hospital discharge follow-up.  She was recently hospitalized in December for a right middle lobe pulmonary embolism and a DVT in her left leg. She was started on Eliquis , which has improved her pain when breathing, but she continues to experience occasional shortness of breath. She uses an albuterol  inhaler and a nebulizer for more severe episodes.  Her symptoms began approximately two weeks before her hospital visit, with significant shortness of breath and coughing. Initial treatment included prednisone  and doxycycline , but symptoms persisted, leading to a hospital visit where blood clots were discovered.  She has a history of immobility due to right leg issues, with knee surgery on hold pending weight loss. She uses a desk chair to move around her house and cannot put weight on her right leg. She has been immobile for over a year.  She has been smoking since age 16, currently about a pack a day, down from two packs. 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  She has diabetes and is on Mounjaro , having restarted it a month ago after a two-month break due to insurance issues. She also has hypertension, managed  with Lasix  and Valsartan .   Ancillary information including prior medications, full medical/surgical/family/social histories, and PFTs (when available) are listed below and have been reviewed.    Review of Systems  Constitutional:  Negative for chills, fever and weight loss.  Respiratory:  Positive for shortness of breath. Negative for cough, hemoptysis, sputum production and wheezing.   Cardiovascular:  Negative for chest pain.     Objective:   Vitals:   05/20/24 1048  BP: (!) 150/86  Pulse: 99  Temp: 97.6 F (36.4 C)  TempSrc: Temporal  SpO2: 97%  Weight: (!) 344 lb 6.4 oz (156.2 kg)  Height: 5' 10 (1.778 m)   97% on RA  BMI Readings from Last 3 Encounters:  05/20/24 49.42 kg/m  05/12/24 47.35 kg/m  04/30/24 46.82 kg/m   Wt Readings from Last 3 Encounters:  05/20/24 (!) 344 lb 6.4 oz (156.2 kg)  05/12/24 (!) 330 lb (149.7 kg)  04/30/24 (!) 326 lb 4.5 oz (148 kg)    .vitalsmbmi  Physical Exam    Ancillary Information    Past Medical History:  Diagnosis Date   Anxiety    Arthritis    Cholecystitis with cholelithiasis 02/25/2013   Complication of anesthesia    woke up crying after tubes tied, anxiety attack   Depression    Depression 04/12/2017   GERD (gastroesophageal reflux disease)    Hypertension    Migraine    no longer have these   MRSA carrier      No family history on file.   Past Surgical History:  Procedure Laterality Date   CHOLECYSTECTOMY     RADIOLOGY WITH ANESTHESIA Bilateral 08/08/2021   Procedure: MRI WITH BILATERAL HIPWITHOUT CONTRAST,RIGHT  KNEE WITHOUT CONTRAST;  Surgeon: Radiologist, Medication, MD;  Location: [facility] OR;  Service: Radiology;  Laterality: Bilateral;   TONSILLECTOMY     TUBAL LIGATION      Social History   Socioeconomic History   Marital status: Single    Spouse name: Not on file   Number of children: Not on file   Years of education: Not on file   Highest education level: Some college, no degree   Occupational History   Not on file  Tobacco Use   Smoking status: Every Day    Current packs/day: 1.00    Average packs/day: 1.1 packs/day for 33.0 years (35.5 ttl pk-yrs)    Types: Cigarettes    Start date: 30   Smokeless tobacco: Never  Vaping Use   Vaping status: Never Used  Substance and Sexual Activity   Alcohol use: Not Currently   Drug use: Yes    Comment: THC   Sexual activity: Not Currently  Other Topics Concern   Not on file  Social History Narrative   Not on file   Social Drivers of Health   Tobacco Use: High Risk (05/20/2024)   Patient History    Smoking Tobacco Use: Every Day    Smokeless Tobacco Use: Never    Passive Exposure: Not on file  Financial Resource Strain: High Risk (04/01/2024)   Overall Financial Resource Strain (CARDIA)    Difficulty of Paying Living Expenses: Very hard  Food Insecurity: No Food Insecurity (04/30/2024)   Epic    Worried About Radiation Protection Practitioner of Food in the Last Year: Never true    Ran Out of Food in the Last Year: Never true  Transportation Needs: No Transportation Needs (04/30/2024)   Epic    Lack of Transportation (Medical): No    Lack of Transportation (Non-Medical): No  Physical Activity: Sufficiently Active (04/01/2024)   Exercise Vital Sign    Days of Exercise per Week: 7 days    Minutes of Exercise per Session: 30 min  Stress: Stress Concern Present (04/01/2024)   [employer] of Occupational Health - Occupational Stress Questionnaire    Feeling of Stress: Very much  Social Connections: Socially Isolated (04/01/2024)   Social Connection and Isolation Panel    Frequency of Communication with Friends and Family: More than three times a week    Frequency of Social Gatherings with Friends and Family: Once a week    Attends Religious Services: Never    Database Administrator or Organizations: No    Attends Banker Meetings: Never    Marital Status: Divorced  Catering Manager Violence: Not At Risk  (04/30/2024)   Epic    Fear of Current or Ex-Partner: No    Emotionally Abused: No    Physically Abused: No    Sexually Abused: No  Depression (PHQ2-9): High Risk (05/12/2024)   Depression (PHQ2-9)    PHQ-2 Score: 14  Alcohol Screen: Low Risk (04/01/2024)   Alcohol Screen    Last Alcohol Screening Score (AUDIT): 0  Housing: Low Risk (04/30/2024)   Epic    Unable to Pay for Housing in the Last Year: No    Number of Times Moved in the Last Year: 0    Homeless in the Last Year: No  Recent Concern: Housing - High Risk (03/27/2024)   Epic    Unable to Pay for Housing in the Last Year: Yes    Number of Times Moved in the Last Year: Not on file    Homeless in the Last Year: No  Utilities: Not At Risk (04/30/2024)   Epic    Threatened with loss of utilities: No  Recent Concern: Utilities - At Risk (04/01/2024)   Epic    Threatened with loss of utilities: Yes  Health Literacy: Adequate Health Literacy (04/01/2024)   B1300 Health Literacy    Frequency of need for help with medical instructions: Never     Allergies[2]   CBC    Component Value Date/Time   WBC 8.3 05/12/2024 1555   RBC 5.09 05/12/2024 1555   HGB 15.3 (H) 05/12/2024 1555   HCT 45.3 05/12/2024 1555   PLT 323.0 05/12/2024 1555   MCV 89.1 05/12/2024 1555   MCH 29.0 05/05/2024 1247   MCHC 33.7 05/12/2024 1555   RDW 14.7 05/12/2024 1555   LYMPHSABS 2.3 09/17/2023 1212   MONOABS 0.6 09/17/2023 1212   EOSABS 0.1 09/17/2023 1212   BASOSABS 0.1 09/17/2023 1212    Pulmonary Functions Testing Results:     No data to display          Outpatient Medications Prior to Visit  Medication Sig Dispense Refill   Accu-Chek Softclix Lancets lancets 1 each 3 (three) times daily.     albuterol  (PROVENTIL ) (2.5 MG/3ML) 0.083% nebulizer solution Take 3 mLs (2.5 mg total) by nebulization every 6 (six) hours as needed for wheezing or shortness of breath. 150 mL 1   albuterol  (VENTOLIN  HFA) 108 (90 Base) MCG/ACT inhaler Inhale  2 puffs into the lungs every 4 (four) hours as needed for wheezing or shortness of breath.     atomoxetine (STRATTERA) 40 MG capsule Take 40 mg by mouth every morning.     Blood Glucose Monitoring Suppl DEVI 1 each by Does not apply route in the morning, at noon, and at bedtime. May substitute to any manufacturer covered by patient's insurance. 1 each 0   buprenorphine-naloxone (SUBOXONE) 8-2 mg SUBL SL tablet Place 1.5 tablets under the tongue daily.     cyanocobalamin  (VITAMIN B12) 1000 MCG tablet Take 1 tablet (1,000 mcg total) by mouth daily. 90 tablet 0   furosemide  (LASIX ) 20 MG tablet Take 1 tablet (20 mg total) by mouth daily. 90 tablet 1   gabapentin  (NEURONTIN ) 100 MG capsule Take 2 capsules (200 mg total) by mouth 2 (two) times daily. 120 capsule 0   metFORMIN  (GLUCOPHAGE -XR) 500 MG 24 hr tablet Take 1 tablet (500 mg total) by mouth daily with breakfast. 90 tablet 1   tirzepatide  (MOUNJARO ) 7.5 MG/0.5ML Pen Inject 7.5 mg into the skin once a week. 2 mL 2   traZODone  (DESYREL ) 100 MG tablet Take 1 tablet (100 mg total) by mouth at bedtime. 30 tablet 0   valsartan -hydrochlorothiazide  (DIOVAN -HCT) 80-12.5 MG tablet Take 1 tablet by mouth daily. 90 tablet 0   Vitamin D ,  Ergocalciferol , (DRISDOL ) 1.25 MG (50000 UNIT) CAPS capsule Take 1 capsule (50,000 Units total) by mouth every 7 (seven) days. 12 capsule 1   apixaban  (ELIQUIS ) 5 MG TABS tablet Take 2 tabs by mouth twice a day through 12/25. On 12/26 begin taking 1 tab by mouth twice a day (Patient taking differently: Take 5 mg by mouth 2 (two) times daily.) 180 tablet 1   No facility-administered medications prior to visit.      [1]  Current Outpatient Medications:    Accu-Chek Softclix Lancets lancets, 1 each 3 (three) times daily., Disp: , Rfl:    albuterol  (PROVENTIL ) (2.5 MG/3ML) 0.083% nebulizer solution, Take 3 mLs (2.5 mg total) by nebulization every 6 (six) hours as needed for wheezing or shortness of breath., Disp: 150 mL, Rfl:  1   albuterol  (VENTOLIN  HFA) 108 (90 Base) MCG/ACT inhaler, Inhale 2 puffs into the lungs every 4 (four) hours as needed for wheezing or shortness of breath., Disp: , Rfl:    atomoxetine (STRATTERA) 40 MG capsule, Take 40 mg by mouth every morning., Disp: , Rfl:    Blood Glucose Monitoring Suppl DEVI, 1 each by Does not apply route in the morning, at noon, and at bedtime. May substitute to any manufacturer covered by patient's insurance., Disp: 1 each, Rfl: 0   buprenorphine-naloxone (SUBOXONE) 8-2 mg SUBL SL tablet, Place 1.5 tablets under the tongue daily., Disp: , Rfl:    cyanocobalamin  (VITAMIN B12) 1000 MCG tablet, Take 1 tablet (1,000 mcg total) by mouth daily., Disp: 90 tablet, Rfl: 0   furosemide  (LASIX ) 20 MG tablet, Take 1 tablet (20 mg total) by mouth daily., Disp: 90 tablet, Rfl: 1   gabapentin  (NEURONTIN ) 100 MG capsule, Take 2 capsules (200 mg total) by mouth 2 (two) times daily., Disp: 120 capsule, Rfl: 0   metFORMIN  (GLUCOPHAGE -XR) 500 MG 24 hr tablet, Take 1 tablet (500 mg total) by mouth daily with breakfast., Disp: 90 tablet, Rfl: 1   nicotine  (NICODERM CQ  - DOSED IN MG/24 HOURS) 14 mg/24hr patch, Place 1 patch (14 mg total) onto the skin daily for 14 days., Disp: 14 patch, Rfl: 0   nicotine  (NICODERM CQ  - DOSED IN MG/24 HOURS) 21 mg/24hr patch, Place 1 patch (21 mg total) onto the skin daily., Disp: 42 patch, Rfl: 0   nicotine  (NICODERM CQ  - DOSED IN MG/24 HR) 7 mg/24hr patch, Place 1 patch (7 mg total) onto the skin daily for 14 days., Disp: 14 patch, Rfl: 0   nicotine  polacrilex (NICOTINE  MINI) 2 MG lozenge, Take 1 lozenge (2 mg total) by mouth every 2 (two) hours as needed for smoking cessation., Disp: 72 lozenge, Rfl: 3   tirzepatide  (MOUNJARO ) 7.5 MG/0.5ML Pen, Inject 7.5 mg into the skin once a week., Disp: 2 mL, Rfl: 2   traZODone  (DESYREL ) 100 MG tablet, Take 1 tablet (100 mg total) by mouth at bedtime., Disp: 30 tablet, Rfl: 0   valsartan -hydrochlorothiazide  (DIOVAN -HCT)  80-12.5 MG tablet, Take 1 tablet by mouth daily., Disp: 90 tablet, Rfl: 0   Vitamin D , Ergocalciferol , (DRISDOL ) 1.25 MG (50000 UNIT) CAPS capsule, Take 1 capsule (50,000 Units total) by mouth every 7 (seven) days., Disp: 12 capsule, Rfl: 1   apixaban  (ELIQUIS ) 5 MG TABS tablet, Take 1 tablet (5 mg total) by mouth 2 (two) times daily., Disp: 60 tablet, Rfl: 11 [2] No Known Allergies  "

## 2024-05-20 NOTE — Patient Instructions (Signed)
 " VISIT SUMMARY: You had a follow-up appointment after your recent hospital discharge for a pulmonary embolism and deep vein thrombosis (DVT). We discussed your current medications, symptoms, and lifestyle factors that may be affecting your health.  YOUR PLAN: -PULMONARY EMBOLISM: A pulmonary embolism is a blood clot in the lungs. You will continue taking Eliquis  until your mobility improves, and then switch to a lower dose for a year. Watch for any signs of bleeding and stop taking Eliquis  if you notice any. We will schedule a breathing test within three months and have a follow-up appointment in three months.  -NICOTINE  DEPENDENCE: Nicotine  dependence means you are addicted to nicotine , commonly from smoking. We discussed the benefits of quitting smoking and the risks of using vapes. You will start using nicotine  patches (21 mg for six weeks, 14 mg for two weeks, and 7 mg for two weeks) and nicotine  lozenges as needed for cravings. We also provided information on the [facility]  Smoking Cessation Program.  INSTRUCTIONS: Please monitor for any signs of bleeding and stop taking Eliquis  if you notice any. Schedule a breathing test within three months and a follow-up appointment in three months.       The [facility]  Quitline: Call 1-800-QUIT-NOW (860-732-3950). The [facility] Quitline is a free service for [place]  residents. Trained counselors are available from 8 am until 3 am, 365 days per year. Services are available in both English and Spanish.   Web Resources Free online support programs can help you track your progress and share experiences with others who are quitting. These are examples: www.becomeanex.org www.trytostop.org  www.smokefree.gov  www.https://www.vargas.com/.aspx  [facility]: offers comprehensive in-person tobacco treatment counseling at [facility] building ([address] [place] [address]).  Open to everyone.  Virtual appointments available. Free parking. Call [phone] to schedule an appointment or [phone] for general information.    Tobacco Cessation Medications  Nicotine  Replacement Therapy (NRT)  Nicotine  is the addictive part of tobacco smoke, but not the most dangerous part. There are 7000 other toxins in cigarettes, including carbon monoxide, that cause disease. People do not generally become addicted to medication. Common problems: People don't use enough medication or stop too early. Medications are safe and effective. Overdose is very uncommon. Use medications as long as needed (3 months minimum). Some combinations work better than single medications. Long acting medications like the NRT patch and bupropion provide continuous treatment for withdrawal symptoms.  PLUS  Short acting medications like the NRT gum, lozenge, inhaler, and nasal spray help people to cope with breakthrough cravings.  ? Nicotine  Patch  Place patch on hairless skin on upper body, including arms and back. Each day: discard old patch, shower, apply new patch to a different site. Apply hydrocortisone  cream to mildly red/irritated areas. Call provider if rash develops. If patch causes sleep disturbance, remove patch at bedtime and replace each morning after shower. Side effects may include: skin irritation, headache, insomnia, abnormal/vivid dreams.  ? Nicotine  Gum  Chew gum slowly, park in cheek when peppery taste or tingling sensation begins (about 15-30 chews). When taste or tingling goes away, begin chewing again. Use until nicotine  is gone (taste or tingle does not return, usually 30 minutes). Park in different areas of mouth. Nicotine  is absorbed through the lining of the mouth. Use enough to control cravings, up to 24 pieces per day (if used alone). Avoid eating or drinking for 15 minutes before using and during use. Side effects may include: mouth/jaw soreness, hiccups,  indigestion,  hypersalivation.  If gum is not chewed correctly, additional side effects may include lightheadedness, nausea/vomiting, throat and mouth irritation.  ? Nicotine  Lozenge  Allow to dissolve slowly in mouth (20-30 minutes). Do not chew or swallow. Nicotine  release may cause a warm tingling sensation. Occasionally rotate to different areas of the mouth. Use enough to control cravings, up to 20 lozenges per day (if used alone). Avoid eating or drinking for 15 minutes before using and during use. Side effects may include: nausea, hiccups, cough, heartburn, headache, gas, insomnia.  ? Nicotine  Nasal Spray Use 1 spray in each nostril (1 dose) and tilt head back for 1 minute. Do not sniff, swallow, or inhale through nose.  Use at least 8 doses (1 spray in each nostril) , up to 40 doses per day (if used alone). To reduce nasal irritation, spray on cotton swab and insert into nose. Side effects may include: nasal and/or throat irritation (hot, peppery, or burning sensation), nasal irritation, tearing, sneezing, cough, headache.  ? Nicotine  Oral Inhaler (puffer) Inhale into the back of the throat or puff in short breaths. Do not inhale into the lungs.  Puff continuously for 20 minutes (about 80 puffs) until cartridge is empty. Change cartridge when it loses the burning in throat sensation (feels like air only). Open cartridges can be saved and used again within 24 hours. Use at least 6 and up to 16 cartridges per day (if used alone).  Avoid eating or drinking for 15 minutes before using and during use. Side effects may include: mouth and/or throat irritation, unpleasant taste, cough, nasal irritation, indigestion, hiccups, headache.  ? Chantix (varenicline) Days 1-3: Take one 0.5 mg white pill each morning for 3 days, one week before quit date. Days 4-7: Increase to one 0.5 mg white pill twice a day in morning and evening for 4 days.  On Day 8 (target quit date), increase to one 1 mg blue pill  twice a day. Maintain this dose for a minimum of 3 months. Take with food and a full glass of water to reduce nausea. Be sure that the two doses are at least 8 hours apart, but try to take second dose early in the evening (i.e. 6 pm) to avoid sleep problems. Common side effects include: nausea, insomnia, headache, abnormal/vivid dreams. Tell your doctor if you have any history of psychiatric illness prior to starting Chantix.  STOP taking CHANTIX and contact a healthcare provider immediately if you experience agitation, hostility, depressed mood, changes in thoughts or behavior that are not typical for you, thinking about or attempting suicide, allergic or skin reactions including swelling, rash, redness, or peeling of the skin.  For patients who have heart disease: Smoking is a major risk factor for cardiovascular disease, and Chantix can help you quit smoking. Chantix may be associated with a small, increased risk of certain heart events in patients who have heart disease. If you have any new or worsening symptoms of heart disease while taking Chantix, such as shortness of breath or trouble breathing, new or worsening chest pain, or new or worsening pain in your legs when walking, call your doctor or get emergency medical help immediately.  ? Wellbutrin / Zyban (bupropion) Take one 150 mg pill each morning for 3 days, one week before target quit date. On Day 4, increase to one 150 mg pill twice a day, morning and evening.  Maintain this dose for a minimum of 3 months. Be sure that the two doses are at least  8 hours apart, but try to take second dose early in the evening (i.e. 6 pm) to avoid sleep problems. Avoid or minimize use of alcohol when taking this medication. Common side effects include: dry mouth, headache, insomnia, nausea, weight loss.  Risk of seizure is 05/998. STOP taking BUPROPION and contact a healthcare provider immediately if you experience agitation, hostility, depressed mood,  changes in thoughts or behavior that are not typical for you, thinking about or attempting suicide, allergic or skin reactions including swelling, rash, redness, or peeling of the skin.                Contains text generated by [employer]bridge.                                 Contains text generated by Abridge.   "

## 2024-05-25 ENCOUNTER — Other Ambulatory Visit: Payer: Self-pay | Admitting: General Practice

## 2024-05-25 DIAGNOSIS — G4700 Insomnia, unspecified: Secondary | ICD-10-CM

## 2024-05-25 DIAGNOSIS — M79601 Pain in right arm: Secondary | ICD-10-CM

## 2024-05-25 NOTE — Progress Notes (Unsigned)
" °  Cardiology Office Note   Date:  05/26/2024  ID:  [name] [place], DOB 04-21-1971, MRN [id number] PCP: Vincente Shivers, NP  [facility] Providers Cardiologist:  Caron Poser, MD     History of Present Illness [name] is a 54 y.o. female PMH morbid obesity, recent PE, who presents for further evaluation and management of hypertension.  Seen by PCP for this issue 05/12/2024.  Recently found to have a left lower extremity DVT and right middle lobe pulmonary embolism.  Last LDL 24 07/2023.  Patient reports she is overall feeling a little better since starting anticoagulation.  Still having some wheezing.  She sees pulmonology.  Relevant CVD History -TTE 04/2024 normal biventricular function, grade 1 diastolic dysfunction, no significant valvular disease - CTPA 04/2024 no significant CAC or aortic atherosclerosis   ROS: Pt denies any chest discomfort, jaw pain, arm pain, palpitations, syncope, presyncope, orthopnea, PND, or LE edema.  Studies Reviewed I have independently reviewed the patient's ECG, recent cardiac testing, previous medical records, previous blood work.  Physical Exam VS:  BP (!) 168/98 (BP Location: Right Arm, Patient Position: Sitting, Cuff Size: Large)   Pulse (!) 104   Ht 5' 10 (1.778 m)   Wt (!) 355 lb (161 kg)   LMP 01/30/2016   SpO2 97%   BMI 50.94 kg/m        Wt Readings from Last 3 Encounters:  05/26/24 (!) 355 lb (161 kg)  05/20/24 (!) 344 lb 6.4 oz (156.2 kg)  05/12/24 (!) 330 lb (149.7 kg)    GEN: No acute distress. NECK: No JVD; No carotid bruits. CARDIAC: RRR, no murmurs, rubs, gallops. RESPIRATORY: Trace expiratory wheezes EXTREMITIES:  Warm and well-perfused. No edema.  ASSESSMENT AND PLAN Hypertension Patient presents for further evaluation of uncontrolled blood pressure.  At this time, would not label her as resistant hypertension since she is only on 2 agents that the lowest dose available.  Hypertension is most  likely driven by metabolic syndrome/morbid obesity +/- OSA.  I think we can uptitrate her current regimen to max dose for now before considering secondary hypertension workup.  Plan: - Increase valsartan -HCTZ combo med to 160-25mg  every day; can further uptitrate to 320-25 if needed after that - Continue weight loss measures given likely significant contribution of morbid obesity/OSA/OHS - If a third agent is needed, would add Norvasc.  At that point, we can consider a secondary hypertension workup including renal artery ultrasound, renin-aldosterone assay, etc. - Discussed obtaining a sleep study with pulmonary medicine; she notes that this is not really a current concern.  If she ends up needing 3 blood pressure agents, then it would probably be prudent to revisit this.  Morbid obesity Complicating all aspects of care.  Continue current weight loss measures with GLP-1. Expect BP control to improve with weight loss.       Dispo: RTC 3 months for continued antihypertensive uptitration and consideration of secondary hypertension workup if needed  Signed, Caron Poser, MD  "

## 2024-05-26 ENCOUNTER — Ambulatory Visit: Payer: MEDICAID

## 2024-05-26 VITALS — BP 168/98 | HR 104 | Ht 70.0 in | Wt 355.0 lb

## 2024-05-26 DIAGNOSIS — I10 Essential (primary) hypertension: Secondary | ICD-10-CM | POA: Diagnosis not present

## 2024-05-26 MED ORDER — VALSARTAN-HYDROCHLOROTHIAZIDE 160-12.5 MG PO TABS: 1.0000 | ORAL_TABLET | Freq: Every day | ORAL | 1 refills | Status: DC

## 2024-05-26 MED ORDER — VALSARTAN-HYDROCHLOROTHIAZIDE 160-25 MG PO TABS: 1.0000 | ORAL_TABLET | Freq: Every day | ORAL | 3 refills | Status: AC

## 2024-05-26 NOTE — Patient Instructions (Signed)
 Medication Instructions:  Stop Valsartan -hydrochlorothiazide  80-12.5 mg Start Valsartan -hydrochlorothiazide  160-12.5 mg, take one tablet daily. *If you need a refill on your cardiac medications before your next appointment, please call your pharmacy*  Lab Work: No lab work ordered. If you have labs (blood work) drawn today and your tests are completely normal, you will receive your results only by: MyChart Message (if you have MyChart) OR A paper copy in the mail If you have any lab test that is abnormal or we need to change your treatment, we will call you to review the results.  Testing/Procedures: No test/procedures ordered.  Follow-Up: At [facility], you and your health needs are our priority.  As part of our continuing mission to provide you with exceptional heart care, our providers are all part of one team.  This team includes your primary Cardiologist (physician) and Advanced Practice Providers or APPs (Physician Assistants and Nurse Practitioners) who all work together to provide you with the care you need, when you need it.  Your next appointment:   3 month(s) for Hypertension  Provider:   You will see one of the following Advanced Practice Providers on your designated Care Team:   Lonni Meager, NP Lesley Maffucci, PA-C Bernardino Bring, PA-C Cadence Wheatcroft, PA-C Tylene Lunch, NP Barnie Hila, NP   We recommend signing up for the patient portal called MyChart.  Sign up information is provided on this After Visit Summary.  MyChart is used to connect with patients for Virtual Visits (Telemedicine).  Patients are able to view lab/test results, encounter notes, upcoming appointments, etc.  Non-urgent messages can be sent to your provider as well.   To learn more about what you can do with MyChart, go to [email or web address].

## 2024-05-29 NOTE — Telephone Encounter (Unsigned)
 Copied from CRM [id number]. Topic: Referral - Status >> May 29, 2024 12:03 PM Charolett L wrote: Reason for CRM: Patient is calling in to verify the status of referral for ortho that was suppose to be sent to [facility]. Back in 04/08/24 Patient requesting a call back with status [phone]# [phone]

## 2024-05-29 NOTE — Telephone Encounter (Signed)
 Called and spoke with patient as I did not see a referral done in November for her for otho. While I was speaking with her I found the referral from April that was placed for ortho to [facility] and there was some back and forth there; she did ask for referral to be changed to [facility] clinic and looks like it was sent to Zachary aberman at [facility] 04/08/24. Patient had called them to schedule her appt and they told the patient they have no referral from us . Can we resend the referral to them?

## 2024-06-09 ENCOUNTER — Telehealth: Payer: Self-pay | Admitting: General Practice

## 2024-06-09 NOTE — Telephone Encounter (Unsigned)
 Copied from CRM #[id number]. Topic: Referral - Status >> Jun 09, 2024 12:03 PM Macario HERO wrote: Reason for CRM: Patient said that she spoke with [facility] and they said they did not receive the referral from us . Patient is a little frustrated and is requesting it's sent to [facility] ortho in [place].  Patient is requesting a phone call once it's completed.

## 2024-06-12 ENCOUNTER — Other Ambulatory Visit: Payer: Self-pay | Admitting: General Practice

## 2024-06-12 DIAGNOSIS — M79601 Pain in right arm: Secondary | ICD-10-CM

## 2024-06-17 ENCOUNTER — Ambulatory Visit: Payer: MEDICAID | Admitting: Physician Assistant

## 2024-06-17 ENCOUNTER — Telehealth: Payer: Self-pay | Admitting: Physician Assistant

## 2024-06-17 ENCOUNTER — Encounter: Payer: Self-pay | Admitting: Physician Assistant

## 2024-06-17 ENCOUNTER — Ambulatory Visit: Payer: MEDICAID

## 2024-06-17 VITALS — BP 136/84 | Ht 70.0 in | Wt 307.0 lb

## 2024-06-17 DIAGNOSIS — G629 Polyneuropathy, unspecified: Secondary | ICD-10-CM

## 2024-06-17 DIAGNOSIS — M4802 Spinal stenosis, cervical region: Secondary | ICD-10-CM

## 2024-06-17 DIAGNOSIS — R200 Anesthesia of skin: Secondary | ICD-10-CM

## 2024-06-17 MED ORDER — GABAPENTIN 300 MG PO CAPS: 300.0000 mg | ORAL_CAPSULE | Freq: Every day | ORAL | 2 refills | Status: AC

## 2024-06-17 NOTE — Telephone Encounter (Signed)
 Chris from [facility] is calling to get clarification on the patient's Gabapentin  prescription. He states that the one prescribed by Lyle today is 300mg  1-2 tablets at bedtime and she has a previous prescription on Gabapentin  100mg  twice a day. He would like to know if the new prescription is taking the place of the old one.

## 2024-06-18 NOTE — Telephone Encounter (Signed)
 Landon at the pharmacy advised.

## 2024-06-19 ENCOUNTER — Ambulatory Visit: Payer: MEDICAID

## 2024-06-19 ENCOUNTER — Ambulatory Visit (INDEPENDENT_AMBULATORY_CARE_PROVIDER_SITE_OTHER): Payer: MEDICAID

## 2024-06-19 ENCOUNTER — Encounter: Payer: Self-pay | Admitting: Student in an Organized Health Care Education/Training Program

## 2024-06-19 VITALS — BP 149/97 | HR 106 | Ht 70.0 in | Wt 317.0 lb

## 2024-06-19 DIAGNOSIS — Z6841 Body Mass Index (BMI) 40.0 and over, adult: Secondary | ICD-10-CM

## 2024-06-19 DIAGNOSIS — M25562 Pain in left knee: Secondary | ICD-10-CM

## 2024-06-19 DIAGNOSIS — M25561 Pain in right knee: Secondary | ICD-10-CM

## 2024-06-19 DIAGNOSIS — M170 Bilateral primary osteoarthritis of knee: Secondary | ICD-10-CM

## 2024-06-19 DIAGNOSIS — G8929 Other chronic pain: Secondary | ICD-10-CM

## 2024-06-19 DIAGNOSIS — E66813 Obesity, class 3: Secondary | ICD-10-CM

## 2024-06-19 NOTE — Progress Notes (Signed)
 "  Office Visit Note   Patient: [name]           Date of Birth: 04-Sep-1970           MRN: [id number] Visit Date: 06/19/2024              Requested by: Vincente Shivers, NP [address] [place],  [place] [address] PCP: Vincente Shivers, NP   Assessment & Plan: Visit Diagnoses:  1. Primary osteoarthritis of both knees   2. Chronic pain of both knees   3. Class 3 severe obesity with serious comorbidity and body mass index (BMI) of 45.0 to 49.9 in adult, unspecified obesity type (HCC)     Plan: Natural history and expected course discussed. Questions answered. Patient has failed conservative management of bilateral knee arthritis however currently is not a candidate for knee replacement surgery given her BMI and smoking history. Explained to patient that BMI limit is 40, as a BMI over 40 puts her at higher risk of complications such as  Patient currently on Mounjaro  and is losing weight. Will continue to work on weight loss and referred to weight loss clinic, as well as pain management. Will follow up in 3 months.  Orders:  Orders Placed This Encounter  Procedures   DG Knee 3 Views Left   DG Knee 3 Views Right   Ambulatory referral to Pain Clinic   Amb ref to Medical Nutrition Therapy-MNT     Subjective: Chief Complaint: Bilateral knee pain  HPI Patient is a 54 y.o. year old female who presents with knee pain involving the  bilateral knee. Onset of the symptoms was several years ago. Inciting event: none known. Current symptoms include pain located in both knees diffusely. Pain is aggravated by any weight bearing. Treatment to date: corticosteroid injection which was ineffective and glucosamine which is ineffective.  Objective: Vital Signs: BP (!) 149/97   Pulse (!) 106   Ht 5' 10 (1.778 m)   Wt (!) 143.8 kg   LMP 01/30/2016   BMI 45.48 kg/m   Physical Exam Gen: Alert, No Acute Distress right knee: Skin intact, no erythema or induration noted. medial joint line  and lateral joint line tenderness to palpation. Range of motion -10 to 90. No instability with varus or valgus stress. +2 DP. SILT DP/SP/T, 5/5 EHL/PF/DF left knee: Skin intact, no erythema or induration noted. medial joint line and lateral joint line tenderness to palpation. Range of motion 0 to 105. No instability with varus or valgus stress. Unable to palpate DP and PT SILT DP/SP/T, 5/5 EHL/PF/DF  Imaging: Radiographs personally reviewed by me; reveal severe osteoarthritis of the bilateral knee   PMFS History: Patient Active Problem List   Diagnosis Date Noted   Hospital discharge follow-up 05/12/2024   Acute deep vein thrombosis (DVT) of distal vein of left lower extremity (HCC) 05/12/2024   Cervical spinal stenosis 05/12/2024   Pulmonary embolism (HCC) 04/30/2024   Bilateral arm pain 04/27/2024   Dysuria 09/17/2023   Shortness of breath 09/17/2023   Chronic otitis externa of both ears 08/20/2023   Encounter for screening involving social determinants of health (SDoH) 08/20/2023   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) 08/15/2023   Vitamin D  deficiency 08/15/2023   Insomnia 08/06/2023   Obesity 08/06/2023   Continuous tobacco abuse 08/06/2023   Osteoarthritis of knee 08/05/2023   Depression, recurrent 04/12/2017   Essential hypertension 04/12/2017   Past Medical History:  Diagnosis Date  Anxiety    Arthritis    Cholecystitis with cholelithiasis 02/25/2013   Complication of anesthesia    woke up crying after tubes tied, anxiety attack   Depression    Depression 04/12/2017   GERD (gastroesophageal reflux disease)    Hypertension    Migraine    no longer have these   MRSA carrier     No family history on file.  Past Surgical History:  Procedure Laterality Date   CHOLECYSTECTOMY     RADIOLOGY WITH ANESTHESIA Bilateral 08/08/2021   Procedure: MRI WITH BILATERAL HIPWITHOUT CONTRAST,RIGHT  KNEE WITHOUT CONTRAST;  Surgeon: Radiologist,  Medication, MD;  Location: [facility] OR;  Service: Radiology;  Laterality: Bilateral;   TONSILLECTOMY     TUBAL LIGATION     Social History   Occupational History   Not on file  Tobacco Use   Smoking status: Every Day    Current packs/day: 1.00    Average packs/day: 1.1 packs/day for 33.1 years (35.6 ttl pk-yrs)    Types: Cigarettes    Start date: 1993   Smokeless tobacco: Never  Vaping Use   Vaping status: Never Used  Substance and Sexual Activity   Alcohol use: Not Currently   Drug use: Yes    Comment: THC   Sexual activity: Not Currently   Current Outpatient Medications  Medication Instructions   Accu-Chek Softclix Lancets lancets 1 each, 3 times daily   albuterol  (PROVENTIL ) 2.5 mg, Nebulization, Every 6 hours PRN   albuterol  (VENTOLIN  HFA) 108 (90 Base) MCG/ACT inhaler 2 puffs, Every 4 hours PRN   apixaban  (ELIQUIS ) 5 mg, Oral, 2 times daily   Blood Glucose Monitoring Suppl DEVI 1 each, Does not apply, 3 times daily, May substitute to any manufacturer covered by patient's insurance.   buprenorphine-naloxone (SUBOXONE) 8-2 mg SUBL SL tablet 1.5 tablets, Daily   cyanocobalamin  (VITAMIN B12) 1,000 mcg, Oral, Daily   furosemide  (LASIX ) 20 mg, Oral, Daily   gabapentin  (NEURONTIN ) 100 MG capsule TAKE 2 CAPSULES (200 MG TOTAL) BY MOUTH 2 TIMES DAILY.   gabapentin  (NEURONTIN ) 300-600 mg, Oral, Daily at bedtime   metFORMIN  (GLUCOPHAGE -XR) 500 mg, Oral, Daily with breakfast   nicotine  (NICODERM CQ  - DOSED IN MG/24 HOURS) 21 mg, Transdermal, Every 24 hours   nicotine  polacrilex (NICOTINE  MINI) 2 mg, Oral, Every 2 hours PRN   tirzepatide  (MOUNJARO ) 7.5 mg, Subcutaneous, Weekly   traZODone  (DESYREL ) 100 mg, Oral, Daily at bedtime   valsartan -hydrochlorothiazide  (DIOVAN  HCT) 160-25 MG tablet 1 tablet, Oral, Daily   Vitamin D  (Ergocalciferol ) (DRISDOL ) 50,000 Units, Oral, Every 7 days   Allergies as of 06/19/2024   (No Known Allergies)   "

## 2024-08-26 ENCOUNTER — Ambulatory Visit: Payer: MEDICAID | Admitting: Nurse Practitioner

## 2024-09-11 ENCOUNTER — Ambulatory Visit: Payer: MEDICAID | Admitting: Student in an Organized Health Care Education/Training Program
